# Patient Record
Sex: Male | Born: 1940
Health system: Southern US, Community
[De-identification: ages and names within clinical notes are randomized; demographics above are authoritative.]

## PROBLEM LIST (undated history)

## (undated) DIAGNOSIS — B1910 Unspecified viral hepatitis B without hepatic coma: Secondary | ICD-10-CM

## (undated) DIAGNOSIS — Z9889 Other specified postprocedural states: Secondary | ICD-10-CM

## (undated) DIAGNOSIS — I10 Essential (primary) hypertension: Secondary | ICD-10-CM

## (undated) DIAGNOSIS — T148XXA Other injury of unspecified body region, initial encounter: Secondary | ICD-10-CM

## (undated) DIAGNOSIS — E785 Hyperlipidemia, unspecified: Secondary | ICD-10-CM

## (undated) DIAGNOSIS — N529 Male erectile dysfunction, unspecified: Secondary | ICD-10-CM

## (undated) DIAGNOSIS — Z1289 Encounter for screening for malignant neoplasm of other sites: Secondary | ICD-10-CM

## (undated) DIAGNOSIS — N400 Enlarged prostate without lower urinary tract symptoms: Secondary | ICD-10-CM

## (undated) HISTORY — DX: Encounter for screening for malignant neoplasm of other sites: Z12.89

## (undated) HISTORY — DX: Essential (primary) hypertension: I10

## (undated) HISTORY — DX: Unspecified viral hepatitis B without hepatic coma: B19.10

## (undated) HISTORY — DX: Benign prostatic hyperplasia without lower urinary tract symptoms: N40.0

## (undated) HISTORY — DX: Male erectile dysfunction, unspecified: N52.9

## (undated) HISTORY — PX: APPENDECTOMY: SHX54

## (undated) HISTORY — DX: Other injury of unspecified body region, initial encounter: T14.8XXA

## (undated) HISTORY — DX: Other specified postprocedural states: Z98.890

## (undated) HISTORY — DX: Hyperlipidemia, unspecified: E78.5

## (undated) HISTORY — PX: TIBIA FRACTURE SURGERY: SHX806

## (undated) HISTORY — PX: TONSILLECTOMY: SUR1361

---

## 1997-02-05 DIAGNOSIS — Z9889 Other specified postprocedural states: Secondary | ICD-10-CM

## 1997-02-05 HISTORY — DX: Other specified postprocedural states: Z98.890

## 1997-06-29 ENCOUNTER — Emergency Department (HOSPITAL_COMMUNITY): Admission: EM | Admit: 1997-06-29 | Discharge: 1997-06-29 | Payer: Self-pay | Admitting: Emergency Medicine

## 1997-07-06 ENCOUNTER — Ambulatory Visit (HOSPITAL_COMMUNITY): Admission: RE | Admit: 1997-07-06 | Discharge: 1997-07-06 | Payer: Self-pay | Admitting: Family Medicine

## 1997-11-29 ENCOUNTER — Ambulatory Visit (HOSPITAL_COMMUNITY): Admission: RE | Admit: 1997-11-29 | Discharge: 1997-11-29 | Payer: Self-pay | Admitting: Interventional Cardiology

## 1997-11-29 ENCOUNTER — Encounter: Payer: Self-pay | Admitting: Interventional Cardiology

## 1997-12-09 ENCOUNTER — Ambulatory Visit (HOSPITAL_COMMUNITY): Admission: RE | Admit: 1997-12-09 | Discharge: 1997-12-09 | Payer: Self-pay | Admitting: Interventional Cardiology

## 1998-01-20 ENCOUNTER — Ambulatory Visit (HOSPITAL_COMMUNITY): Admission: RE | Admit: 1998-01-20 | Discharge: 1998-01-20 | Payer: Self-pay | Admitting: Internal Medicine

## 2000-02-21 ENCOUNTER — Encounter: Payer: Self-pay | Admitting: Emergency Medicine

## 2000-02-21 ENCOUNTER — Emergency Department (HOSPITAL_COMMUNITY): Admission: EM | Admit: 2000-02-21 | Discharge: 2000-02-21 | Payer: Self-pay | Admitting: Emergency Medicine

## 2003-09-07 ENCOUNTER — Encounter: Admission: RE | Admit: 2003-09-07 | Discharge: 2003-10-19 | Payer: Self-pay | Admitting: Emergency Medicine

## 2004-10-27 ENCOUNTER — Encounter: Admission: RE | Admit: 2004-10-27 | Discharge: 2005-01-25 | Payer: Self-pay | Admitting: Family Medicine

## 2004-11-28 LAB — HM COLONOSCOPY

## 2006-04-19 ENCOUNTER — Emergency Department (HOSPITAL_COMMUNITY): Admission: EM | Admit: 2006-04-19 | Discharge: 2006-04-19 | Payer: Self-pay | Admitting: Pediatrics

## 2007-07-22 ENCOUNTER — Encounter: Payer: Self-pay | Admitting: Family Medicine

## 2007-07-22 LAB — CONVERTED CEMR LAB
ALT: 30 units/L
AST: 23 units/L
BUN: 24 mg/dL
Glucose, Bld: 117 mg/dL
Total Protein: 6.3 g/dL

## 2007-10-01 ENCOUNTER — Encounter: Payer: Self-pay | Admitting: Family Medicine

## 2007-10-01 LAB — CONVERTED CEMR LAB
AST: 25 units/L
BUN: 24 mg/dL
Creatinine, Ser: 1.1 mg/dL
HDL: 58 mg/dL
Hgb A1c MFr Bld: 6.4 %
Potassium: 4 meq/L
Sodium: 140 meq/L
Total Protein: 6.3 g/dL
Triglycerides: 79 mg/dL

## 2008-02-06 LAB — FECAL OCCULT BLOOD, GUAIAC: Fecal Occult Blood: NEGATIVE

## 2008-04-16 ENCOUNTER — Encounter: Payer: Self-pay | Admitting: Family Medicine

## 2008-04-16 LAB — CONVERTED CEMR LAB
ALT: 19 units/L
HDL: 57 mg/dL
Hgb A1c MFr Bld: 5.3 %
Total Protein: 5.9 g/dL
Triglycerides: 63 mg/dL

## 2008-08-18 ENCOUNTER — Encounter: Payer: Self-pay | Admitting: Family Medicine

## 2008-08-18 LAB — CONVERTED CEMR LAB
Creatinine, Ser: 1.06 mg/dL
Hgb A1c MFr Bld: 6.3 %
Hgb A1c MFr Bld: 6.3 %
Microalb, Ur: 3.8 mg/dL
Microalb, Ur: 3.8 mg/dL
PSA: 5.1 ng/mL
PSA: 5.1 ng/mL
Potassium: 4 meq/L

## 2009-02-05 DIAGNOSIS — T148XXA Other injury of unspecified body region, initial encounter: Secondary | ICD-10-CM

## 2009-02-05 HISTORY — DX: Other injury of unspecified body region, initial encounter: T14.8XXA

## 2009-08-08 ENCOUNTER — Ambulatory Visit: Payer: Self-pay | Admitting: Diagnostic Radiology

## 2009-08-08 ENCOUNTER — Emergency Department (HOSPITAL_BASED_OUTPATIENT_CLINIC_OR_DEPARTMENT_OTHER): Admission: EM | Admit: 2009-08-08 | Discharge: 2009-08-08 | Payer: Self-pay | Admitting: Emergency Medicine

## 2009-09-14 ENCOUNTER — Encounter: Payer: Self-pay | Admitting: Family Medicine

## 2009-09-14 ENCOUNTER — Ambulatory Visit: Payer: Self-pay | Admitting: Family Medicine

## 2009-09-14 DIAGNOSIS — E785 Hyperlipidemia, unspecified: Secondary | ICD-10-CM | POA: Insufficient documentation

## 2009-09-14 DIAGNOSIS — E119 Type 2 diabetes mellitus without complications: Secondary | ICD-10-CM | POA: Insufficient documentation

## 2009-09-14 DIAGNOSIS — N529 Male erectile dysfunction, unspecified: Secondary | ICD-10-CM | POA: Insufficient documentation

## 2009-09-14 DIAGNOSIS — I10 Essential (primary) hypertension: Secondary | ICD-10-CM | POA: Insufficient documentation

## 2009-09-14 DIAGNOSIS — N400 Enlarged prostate without lower urinary tract symptoms: Secondary | ICD-10-CM | POA: Insufficient documentation

## 2009-09-14 LAB — HM DIABETES FOOT EXAM

## 2009-09-14 LAB — CONVERTED CEMR LAB

## 2009-09-16 ENCOUNTER — Encounter (INDEPENDENT_AMBULATORY_CARE_PROVIDER_SITE_OTHER): Payer: Self-pay | Admitting: *Deleted

## 2009-09-16 LAB — CONVERTED CEMR LAB
AST: 24 units/L (ref 0–37)
Alkaline Phosphatase: 63 units/L (ref 39–117)
Bilirubin, Direct: 0.2 mg/dL (ref 0.0–0.3)
GFR calc non Af Amer: 84.94 mL/min (ref >60)
Glucose, Bld: 98 mg/dL (ref 70–99)
HDL: 55.2 mg/dL (ref >39.00)
Hgb A1c MFr Bld: 5.7 % (ref 4.6–6.5)
LDL Cholesterol: 71 mg/dL (ref 0–99)
PSA: 3.79 ng/mL (ref 0.10–4.00)
Potassium: 4.1 meq/L (ref 3.5–5.1)
Sodium: 143 meq/L (ref 135–145)
Total Bilirubin: 0.9 mg/dL (ref 0.3–1.2)
Total CHOL/HDL Ratio: 3
VLDL: 12.2 mg/dL (ref 0.0–40.0)

## 2009-09-20 ENCOUNTER — Encounter: Payer: Self-pay | Admitting: Family Medicine

## 2009-09-26 ENCOUNTER — Telehealth: Payer: Self-pay | Admitting: Family Medicine

## 2009-10-05 ENCOUNTER — Encounter: Payer: Self-pay | Admitting: Family Medicine

## 2010-02-21 ENCOUNTER — Other Ambulatory Visit: Payer: Self-pay | Admitting: Family Medicine

## 2010-02-21 ENCOUNTER — Ambulatory Visit
Admission: RE | Admit: 2010-02-21 | Discharge: 2010-02-21 | Payer: Self-pay | Source: Home / Self Care | Attending: Family Medicine | Admitting: Family Medicine

## 2010-02-21 LAB — GLUCOSE, RANDOM: Glucose, Bld: 113 mg/dL — ABNORMAL HIGH (ref 70–99)

## 2010-02-21 LAB — HEMOGLOBIN A1C: Hgb A1c MFr Bld: 5.9 % (ref 4.6–6.5)

## 2010-03-07 NOTE — Miscellaneous (Signed)
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  (10/05/2009 17:47) Added new observation of PAST MED HX: R proximal humerus fx 2011 1999 Cardiac Catheterization and Tilt Table Test, both negative DEPRESSION (ICD-311) with night terrors, treated with Zoloft BENIGN PROSTATIC HYPERTROPHY (ICD-600.00) HYPERTENSION (ICD-401.9) HYPERLIPIDEMIA (ICD-272.4) SPECIAL SCREENING MALIG NEOPLASMS OTHER SITES (ICD-V76.49) HEALTH MAINTENANCE EXAM (ICD-V70.0) ORGANIC IMPOTENCE (ICD-607.84) BENIGN PROSTATIC HYPERTROPHY, MILD, HX OF (ICD-V13.8) DIABETES MELLITUS, TYPE II, CONTROLLED (ICD-250.00)   (10/05/2009 17:47) Added new observation of MICROALB TST: 3.8 mg/dL (54/10/8117 14:78) Added new observation of HGBA1C: 6.3 % (08/18/2008 17:47) Added new observation of PSA: 5.1 ng/mL (08/18/2008 17:47) Added new observation of CREATININE: 1.06 mg/dL (29/56/2130 86:57) Added new observation of BUN: 22 mg/dL (84/69/6295 28:41) Added new observation of BG RANDOM: 94 mg/dL (32/44/0102 72:53) Added new observation of K SERUM: 4.0 meq/L (08/18/2008 17:47) Added new observation of NA: 138 meq/L (08/18/2008 17:47) Added new observation of HGBA1C: 5.3 % (04/16/2008 17:47) Added new observation of PROTEIN, TOT: 5.9 g/dL (66/44/0347 42:59) Added new observation of SGPT (ALT): 19 units/L (04/16/2008 17:47) Added new observation of SGOT (AST): 21 units/L (04/16/2008 17:47) Added new observation of CREATININE: 1.20 mg/dL (56/38/7564 33:29) Added new observation of BUN: 21 mg/dL (51/88/4166 06:30) Added new observation of BG RANDOM: 107 mg/dL (16/02/930 35:57) Added new observation of LDL DIR: 69 mg/dL (32/20/2542 70:62) Added new  observation of HDL: 57 mg/dL (37/62/8315 17:61) Added new observation of TRIGLYC TOT: 63 mg/dL (60/73/7106 26:94) Added new observation of CHOLESTEROL: 138 mg/dL (85/46/2703 50:09) Added new observation of HGBA1C: 6.4 % (10/01/2007 17:47) Added new observation of PROTEIN, TOT: 6.3 g/dL (38/18/2993 71:69) Added new observation of SGPT (ALT): 31 units/L (10/01/2007 17:47) Added new observation of SGOT (AST): 25 units/L (10/01/2007 17:47) Added new observation of CREATININE: 1.10 mg/dL (67/89/3810 17:51) Added new observation of BUN: 24 mg/dL (02/58/5277 82:42) Added new observation of BG RANDOM: 116 mg/dL (35/36/1443 15:40) Added new observation of K SERUM: 4.0 meq/L (10/01/2007 17:47) Added new observation of NA: 140 meq/L (10/01/2007 17:47) Added new observation of LDL DIR: 106 mg/dL (08/67/6195 09:32) Added new observation of CHOL/HDL: 2.86  (10/01/2007 17:47) Added new observation of HDL: 58 mg/dL (67/01/4579 99:83) Added new observation of TRIGLYC TOT: 79 mg/dL (38/25/0539 76:73) Added new observation of CHOLESTEROL: 166 mg/dL (41/93/7902 40:97) Added new observation of HGBA1C: 6.2 % (07/22/2007 17:47) Added new observation of PROTEIN, TOT: 6.3 g/dL (35/32/9924 26:83) Added new observation of SGPT (ALT): 30 units/L (07/22/2007 17:47) Added new observation of SGOT (AST): 23 units/L (07/22/2007 17:47) Added new observation of CREATININE: 1.00 mg/dL (41/96/2229 79:89) Added new observation of BUN: 24 mg/dL (21/19/4174 08:14) Added new observation of BG RANDOM: 117 mg/dL (48/18/5631 49:70) Added new observation of K SERUM: 4.1 meq/L (07/22/2007 17:47) Added new observation of NA: 138 meq/L (07/22/2007 17:47)      Past History:  Past Medical History: R proximal humerus fx 2011 1999 Cardiac Catheterization and Tilt Table Test, both negative DEPRESSION (ICD-311) with night terrors, treated with Zoloft BENIGN PROSTATIC HYPERTROPHY (ICD-600.00) HYPERTENSION (ICD-401.9) HYPERLIPIDEMIA  (ICD-272.4) SPECIAL SCREENING MALIG NEOPLASMS OTHER SITES (ICD-V76.49) HEALTH MAINTENANCE EXAM (ICD-V70.0) ORGANIC IMPOTENCE (ICD-607.84) BENIGN PROSTATIC HYPERTROPHY, MILD, HX OF (ICD-V13.8) DIABETES MELLITUS, TYPE II, CONTROLLED (ICD-250.00)     Social History: No  tob, smoked from 1966 to 1986 No alcohol 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

## 2010-03-07 NOTE — Assessment & Plan Note (Signed)
Summary: CPX FOR FAA PER dUNCAN/DLO   Vital Signs:  Patient profile:   70 year old male Height:      70 inches Weight:      203.75 pounds BMI:     29.34 Temp:     97.5 degrees F oral Pulse rate:   84 / minute Pulse rhythm:   regular BP sitting:   128 / 80  (left arm) Cuff size:   large  Vitals Entered By: Delilah Shan CMA Duncan Dull) (September 14, 2009 2:05 PM)  CC: CPX for FAA per Dr. Para March, Preventive Care   History of Present Illness: CPE- See prev med.  Elevated Cholesterol: Using medications without problems: yes Muscle aches: no Other complaints: no  Diabetes:  Using medications without difficulties:yes Hypoglycemic episodes:no Hyperglycemic episodes:no Feet problems:no Blood Sugars averaging:  ~120 usually, ranges 90-130 eye exam within last year: follow up is pending  Hypertension:      Using medication without problems or lightheadedness: yes Chest pain with exertion:no Edema:no Short of breath:no Average home UYQ:[id number]  ~120/70 Other issues: no  ED noted recently by patient.  Unsure if he would like to try meds at this point.   Allergies (verified): 1)  ! * Alcoholic Beverages  Past History:  Past Medical History: Diabetes mellitus, type II Hyperlipidemia Hypertension Benign prostatic hypertrophy R proximal humerus fx 2011  Past Surgical History: L Leg fx and repair, resolved w/o deficit Appendectomy Tonsillectomy  Family History: Reviewed history and no changes required. F alive, h/o colon CA-treated. also with seizure d/o after head injury M dead colon CA brother alive, knee replacement  Social History: Reviewed history and no changes required. 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  Otherwise negative.  feeling well .  Physical Exam  General:  GEN: nad, alert and oriented HEENT:  mucous membranes moist NECK: supple w/o LA CV: rrr.  no murmur PULM: ctab, no inc wob ABD: soft, +bs EXT: no edema SKIN: no acute rash  R shoulder abducts to 90deg in isolation/AROM, up to 135deg PROM.  Distally NV intact.  Rectal:  No external abnormalities noted. Normal sphincter tone. No rectal masses or tenderness. Prostate:  Prostate gland firm and smooth, minimal enlargement, no nodularity, tenderness, mass, asymmetry or induration.  Diabetes Management Exam:    Foot Exam (with socks and/or shoes not present):       Sensory-Pinprick/Light touch:          Left medial foot (L-4): normal          Left dorsal foot (L-5): normal          Left lateral foot (S-1): normal          Right medial foot (L-4): normal          Right dorsal foot (L-5): normal          Right lateral foot (S-1): normal       Sensory-Monofilament:          Left foot: normal          Right foot: normal       Inspection:          Left foot: normal          Right foot: normal       Nails:  Left foot: normal          Right foot: normal   Impression & Recommendations:  Problem # 1:  Preventive Health Care (ICD-V70.0) D/w patient re:flu and shingles shot.  See prev med and orders/referral.   Problem # 2:  BENIGN PROSTATIC HYPERTROPHY, MILD, HX OF (ICD-V13.8) D/w patient re:psa. contatc wiht labs.  Orders: TLB-PSA (Prostate Specific Antigen) (84153-PSA)  Problem # 3:  HYPERCHOLESTEROLEMIA (ICD-272.0) contact with labs.  His updated medication list for this problem includes:    Zocor 40 Mg Tabs (Simvastatin) .Marland Kitchen... Take 1 tab by mouth at bedtime  Problem # 4:  HYPERTENSION, BENIGN (ICD-401.1) contact with labs.  His updated medication list for this problem includes:    Cozaar 50 Mg Tabs (Losartan potassium) .Marland Kitchen... Take 1 tablet by mouth every morning    Doxazosin Mesylate 8 Mg Tabs (Doxazosin mesylate) .Marland Kitchen... Take one table by mouth each evening  Problem # 5:  DIABETES MELLITUS, TYPE II,  CONTROLLED (ICD-250.00) contact with labs.  His updated medication list for this problem includes:    Actos 30 Mg Tabs (Pioglitazone hcl) .Marland Kitchen... Take 1 tablet by mouth every morning    Cozaar 50 Mg Tabs (Losartan potassium) .Marland Kitchen... Take 1 tablet by mouth every morning    Metformin Hcl 500 Mg Tabs (Metformin hcl) .Marland Kitchen... Take 1 tablet by mouth two times a day    Aspirin 81 Mg Tabs (Aspirin) .Marland Kitchen... Take 1 tab by mouth at bedtime  Orders: TLB-BMP (Basic Metabolic Panel-BMET) (80048-METABOL) TLB-Hepatic/Liver Function Pnl (80076-HEPATIC) TLB-Lipid Panel (80061-LIPID) TLB-A1C / Hgb A1C (Glycohemoglobin) (83036-A1C) TLB-Microalbumin/Creat Ratio, Urine (82043-MALB)  Problem # 6:  ORGANIC IMPOTENCE (ICD-607.84) Defer to patient on starting treatment.  No new meds for this today.   Complete Medication List: 1)  Actos 30 Mg Tabs (Pioglitazone hcl) .... Take 1 tablet by mouth every morning 2)  Cozaar 50 Mg Tabs (Losartan potassium) .... Take 1 tablet by mouth every morning 3)  Metformin Hcl 500 Mg Tabs (Metformin hcl) .... Take 1 tablet by mouth two times a day 4)  Doxazosin Mesylate 8 Mg Tabs (Doxazosin mesylate) .... Take one table by mouth each evening 5)  Zocor 40 Mg Tabs (Simvastatin) .... Take 1 tab by mouth at bedtime 6)  Aspirin 81 Mg Tabs (Aspirin) .... Take 1 tab by mouth at bedtime  Other Orders: EKG w/ Interpretation (93000) Gastroenterology Referral (GI)  Colorectal Screening:  Current Recommendations:    Hemoccult: NEG X 1 today    Colonoscopy recommended: scheduled with G.I.  PSA Screening:    Reviewed PSA screening recommendations: PSA ordered  Patient Instructions: 1)  We'll contact you with your lab report.  See Shirlee Limerick about your referral before your leave today.  I'll send a copy of your note.  Take care.  Prescriptions: ZOCOR 40 MG TABS (SIMVASTATIN) Take 1 tab by mouth at bedtime  #90 x 3   Entered and Authorized by:   Crawford Givens MD   Signed by:   Crawford Givens MD  on 09/14/2009   Method used:   Print then Give to Patient   RxID:   [id number] DOXAZOSIN MESYLATE 8 MG TABS (DOXAZOSIN MESYLATE) Take one table by mouth each evening  #90 x 3   Entered and Authorized by:   Crawford Givens MD   Signed by:   Crawford Givens MD on 09/14/2009   Method used:   Print then Give to Patient   RxID:   [id number] METFORMIN HCL 500 MG TABS (METFORMIN HCL) Take 1  tablet by mouth two times a day  #180 x 3   Entered and Authorized by:   Crawford Givens MD   Signed by:   Crawford Givens MD on 09/14/2009   Method used:   Print then Give to Patient   RxID:   [id number] COZAAR 50 MG TABS (LOSARTAN POTASSIUM) Take 1 tablet by mouth every morning  #90 x 3   Entered and Authorized by:   Crawford Givens MD   Signed by:   Crawford Givens MD on 09/14/2009   Method used:   Print then Give to Patient   RxID:   [id number] ACTOS 30 MG TABS (PIOGLITAZONE HCL) Take 1 tablet by mouth every morning  #90 x 3   Entered and Authorized by:   Crawford Givens MD   Signed by:   Crawford Givens MD on 09/14/2009   Method used:   Print then Give to Patient   RxID:   [id number]   Prior Medications: ACTOS 30 MG TABS (PIOGLITAZONE HCL) Take 1 tablet by mouth every morning COZAAR 50 MG TABS (LOSARTAN POTASSIUM) Take 1 tablet by mouth every morning METFORMIN HCL 500 MG TABS (METFORMIN HCL) Take 1 tablet by mouth two times a day DOXAZOSIN MESYLATE 8 MG TABS (DOXAZOSIN MESYLATE) Take one table by mouth each evening ZOCOR 40 MG TABS (SIMVASTATIN) Take 1 tab by mouth at bedtime ASPIRIN 81 MG  TABS (ASPIRIN) Take 1 tab by mouth at bedtime Current Allergies (reviewed today): ! * ALCOHOLIC BEVERAGES        Prevention & Chronic Care Immunizations   Influenza vaccine: Not documented   Influenza vaccine due: 10/06/2009    Tetanus booster: Not documented    Pneumococcal vaccine: Not documented   Pneumococcal vaccine deferral: Not indicated  (09/14/2009)    H. zoster vaccine:  Not documented  Colorectal Screening   Hemoccult: Not documented   Hemoccult action/deferral: NEG X 1 today  (09/14/2009)    Colonoscopy: Not documented   Colonoscopy action/deferral: scheduled with G.I.  (09/14/2009)  Other Screening   PSA: Not documented   PSA ordered.   PSA action/deferral: PSA ordered  (09/14/2009)   Smoking status: Not documented  Diabetes Mellitus   HgbA1C: Not documented    Eye exam: Not documented    Foot exam: yes  (09/14/2009)   Foot exam action/deferral: Do today   High risk foot: Not documented   Foot care education: Not documented    Urine microalbumin/creatinine ratio: Not documented    Diabetes flowsheet reviewed?: Yes  Lipids   Total Cholesterol: Not documented   LDL: Not documented   LDL Direct: Not documented   HDL: Not documented   Triglycerides: Not documented    SGOT (AST): Not documented   SGPT (ALT): Not documented   Alkaline phosphatase: Not documented   Total bilirubin: Not documented    Lipid flowsheet reviewed?: Yes  Hypertension   Last Blood Pressure: 128 / 80  (09/14/2009)   Serum creatinine: Not documented   Serum potassium Not documented    Hypertension flowsheet reviewed?: Yes  Self-Management Support :    Diabetes self-management support: Not documented    Hypertension self-management support: Not documented    Lipid self-management support: Not documented    Nursing Instructions: Diabetic foot exam today       EKG  Procedure date:  09/14/2009  Findings:      Normal sinus rhythm with rate of:  Sinus bradycardia with rate of:    EKG  Procedure date:  09/14/2009  Findings:  Sinus bradycardia with rate of:  50, o/w wnl

## 2010-03-07 NOTE — Progress Notes (Signed)
Summary: rxs  Phone Note Refill Request Call back at [phone] Message from:  Patient on September 26, 2009 8:48 AM  Refills Requested: Medication #1:  ACTOS 30 MG TABS Take 1 tablet by mouth every morning  Medication #2:  COZAAR 50 MG TABS Take 1 tablet by mouth every morning  Medication #3:  METFORMIN HCL 500 MG TABS Take 1 tablet by mouth two times a day  Medication #4:  ZOCOR 40 MG TABS Take 1 tab by mouth at bedtime Patient also needs Doxazosin Mesylate 8 mg tabs. He says that it will be two or three weeks before he will receive these through his mail order pharmacy and wants to know if he could get a two or three week supply to last him until then. Uses [facility].   Initial call taken by: Melody Comas,  September 26, 2009 8:48 AM  Follow-up for Phone Call        done.  Follow-up by: Crawford Givens MD,  September 26, 2009 8:55 AM    Prescriptions: DOXAZOSIN MESYLATE 8 MG TABS (DOXAZOSIN MESYLATE) Take one table by mouth each evening  #30 x 3   Entered and Authorized by:   Crawford Givens MD   Signed by:   Crawford Givens MD on 09/26/2009   Method used:   Electronically to        [facility]* (retail)       [address]       [place], [place]  [id number]       Ph: [phone]       Fax: [phone]   RxID:   [id number]

## 2010-03-07 NOTE — Letter (Signed)
Summary: Generic Letter   at [facility]  [address] [place], [place] [address]   Phone: [phone]  Fax: [phone]    09/16/2009    Nadim Caras [address] [place], [place]  [address]       Mr. Gains:  Your labs are all normal or unremarkable.  A copy of your examination note and your labwork is enclosed.  Thank you.  Lugene Fuquay CMA (AAMA)  September 16, 2009 9:18 AM

## 2010-03-07 NOTE — Miscellaneous (Signed)
  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  (09/20/2009 16:28) Added new observation of REGULAREXERC: no (09/20/2009 16:28) Added new observation of COLONOSCOPY: Due 2011 - Buccini (02/05/2009 16:34) Added new observation of MICROABL/CRE: 136.3 mg/mg (creat) (08/18/2008 16:28) Added new observation of MICROALB TST: 3.8 mg/dL (16/11/9602 54:09) Added new observation of HGBA1C: 6.3 % (08/18/2008 16:28) Added new observation of PSA: 5.1 ng/mL (08/18/2008 16:28) Added new observation of CREATININE: 1.06 mg/dL (81/19/1478 29:56) Added new observation of BG RANDOM: 94 mg/dL (21/30/8657 84:69) Added new observation of HEMOCCULT: negative  (02/06/2008 16:34) Added new observation of PNEUMOVAX: given  (02/06/2004 16:34) Added new observation of TD BOOSTER: Td  (02/06/2004 16:34)      Preventive Care Screening  Colonoscopy:    Date:  02/05/2009    Results:  Due 2011 - Buccini  PSA:    Date:  08/18/2008    Results:  5.1 ng/mL  Hemoccult:    Date:  02/06/2008    Results:  negative   Last Pneumovax:    Date:  02/06/2004    Results:  given   Last Tetanus Booster:    Date:  02/06/2004    Results:  Td      Social History: No tob No alcohol No drug use 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

## 2010-03-09 NOTE — Assessment & Plan Note (Signed)
Summary: F/U,CHECK SUGAR/CLE   Vital Signs:  Patient profile:   70 year old male Height:      70 inches Weight:      207.75 pounds BMI:     29.92 Temp:     97.8 degrees F oral Pulse rate:   76 / minute Pulse rhythm:   regular BP sitting:   122 / 70  (left arm) Cuff size:   large  Vitals Entered By: Delilah Shan CMA Duncan Dull) (February 21, 2010 8:06 AM) CC: F/U - check sugar   History of Present Illness: Occ cold feeling in his feet and some constipation.  New meds (refills) arrived in December and since then sugar has been up.  No change in diet to explain this.  He has been attentive to diet- using sugar free candy.  Unsure if this was due to meds, strips, or other cause.  On DM2 diet.  Still using the old strips/meter.    The copay changed on the actos changed but the pills look the same.  His sugars have been  ~200 in AM and this is unusual for the patient.   Allergies: 1)  ! 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  Otherwise negative.    Physical Exam  General:  GEN: nad, alert and oriented HEENT: mucous membranes moist NECK: supple w/o LA CV: rrr.  no murmur PULM: ctab, no inc wob ABD: soft, +bs EXT: no edema SKIN: no acute rash  R shoulder  range of motion improved from prev. Normal ext rotation, mild decrease in internal rotation.    Impression & Recommendations:  Problem # 1:  DIABETES MELLITUS, TYPE II (ICD-250.00) See notes on labs.  No change in meds yet. Rx written for lancets and strips.  3 month supply of both with 3rf.   His updated medication list for this problem includes:    Actos 30 Mg Tabs (Pioglitazone hcl) .Marland Kitchen... Take 1 tablet by mouth every morning    Cozaar 50 Mg Tabs (Losartan potassium) .Marland Kitchen... Take 1 tablet by mouth every morning    Metformin Hcl 500 Mg Tabs (Metformin hcl) .Marland Kitchen... Take 1 tablet by mouth two times a day    Aspirin 81 Mg Tabs (Aspirin) .Marland Kitchen... Take 1 tab by mouth at bedtime  Orders: Specimen Handling ([address]) Venipuncture ([address]) TLB-Glucose, QUANT (82947-GLU) TLB-A1C / Hgb A1C (Glycohemoglobin) (83036-A1C)  Complete Medication List: 1)  Actos 30 Mg Tabs (Pioglitazone hcl) .... Take 1 tablet by mouth every morning 2)  Cozaar 50 Mg Tabs (Losartan potassium) .... Take 1 tablet by mouth every morning 3)  Metformin Hcl 500 Mg Tabs (Metformin hcl) .... Take 1 tablet by mouth two times a day 4)  Doxazosin Mesylate 8 Mg Tabs (Doxazosin mesylate) .... Take one table by mouth each evening 5)  Zocor 40 Mg Tabs (Simvastatin) .... Take 1 tab by mouth  at bedtime 6)  Aspirin 81 Mg Tabs (Aspirin) .... Take 1 tab by mouth at bedtime  Patient Instructions: 1)  We'll contact you with your lab report. If your sugar is high, we may need to get you a 30d supply of your med locally and see if the readings are different.  Take care.    Orders Added: 1)  Est. Patient Level III [30865] 2)  Specimen Handling [99000] 3)  Venipuncture [36415] 4)  TLB-Glucose, QUANT [82947-GLU] 5)  TLB-A1C / Hgb A1C (Glycohemoglobin) [83036-A1C]    Current Allergies (reviewed today): ! * ALCOHOLIC BEVERAGES

## 2010-05-29 ENCOUNTER — Ambulatory Visit (INDEPENDENT_AMBULATORY_CARE_PROVIDER_SITE_OTHER): Payer: BC Managed Care – PPO | Admitting: Family Medicine

## 2010-05-29 ENCOUNTER — Encounter: Payer: Self-pay | Admitting: Family Medicine

## 2010-05-29 VITALS — BP 124/76 | HR 76 | Temp 97.4°F | Wt 217.0 lb

## 2010-05-29 DIAGNOSIS — W57XXXA Bitten or stung by nonvenomous insect and other nonvenomous arthropods, initial encounter: Secondary | ICD-10-CM | POA: Insufficient documentation

## 2010-05-29 DIAGNOSIS — T148 Other injury of unspecified body region: Secondary | ICD-10-CM

## 2010-05-29 MED ORDER — DOXYCYCLINE HYCLATE 100 MG PO TABS: 100.0000 mg | ORAL_TABLET | Freq: Two times a day (BID) | ORAL | Status: AC

## 2010-05-29 NOTE — Assessment & Plan Note (Signed)
Start doxy given the erythema.  Border marked and pt to fu prn.  Okay for outpatient fu and w/o systemic sx currently.

## 2010-05-29 NOTE — Patient Instructions (Signed)
Start the doxycycline today and be careful about sunburns.  Let me know if the area gets bigger, more painful, or if you have other concerns.

## 2010-05-29 NOTE — Progress Notes (Signed)
Tick bite 4/20 on R posterior knee.  Tick was attached but was removed by patient.  Area is red since Saturday AM, itching.  No FCNAVD.   Some pain with walking but not ttp.  Came in because of the local erythema.  Meds, vitals, and allergies reviewed.   ROS: See HPI.  Otherwise, noncontributory.  nad No rash noted except for 5x7.5cm area on R knee, posterior, with blanching macular erythema with central epithelial disruption but no purulent discharge or fluctuance.

## 2010-07-07 ENCOUNTER — Other Ambulatory Visit: Payer: Self-pay | Admitting: *Deleted

## 2010-07-07 MED ORDER — PIOGLITAZONE HCL 30 MG PO TABS: 30.0000 mg | ORAL_TABLET | Freq: Every day | ORAL | Status: DC

## 2010-07-07 MED ORDER — DOXAZOSIN MESYLATE 8 MG PO TABS: 8.0000 mg | ORAL_TABLET | Freq: Every day | ORAL | Status: DC

## 2010-07-07 MED ORDER — LOSARTAN POTASSIUM 50 MG PO TABS: 50.0000 mg | ORAL_TABLET | Freq: Every day | ORAL | Status: DC

## 2010-07-07 MED ORDER — METFORMIN HCL 500 MG PO TABS: 500.0000 mg | ORAL_TABLET | Freq: Two times a day (BID) | ORAL | Status: DC

## 2010-07-07 MED ORDER — SIMVASTATIN 40 MG PO TABS: 40.0000 mg | ORAL_TABLET | Freq: Every day | ORAL | Status: DC

## 2010-07-07 NOTE — Telephone Encounter (Signed)
Received fax from pharmacy stating that patient would like to start getting his Rx's from the local pharmacy.  Please advise.

## 2010-07-07 NOTE — Telephone Encounter (Signed)
D/w pt and meds sent.

## 2010-08-18 ENCOUNTER — Encounter: Payer: Self-pay | Admitting: Family Medicine

## 2010-08-18 ENCOUNTER — Ambulatory Visit (INDEPENDENT_AMBULATORY_CARE_PROVIDER_SITE_OTHER): Payer: BC Managed Care – PPO | Admitting: Family Medicine

## 2010-08-18 VITALS — BP 118/74 | HR 64 | Temp 98.2°F | Ht 71.0 in | Wt 215.0 lb

## 2010-08-18 DIAGNOSIS — I10 Essential (primary) hypertension: Secondary | ICD-10-CM

## 2010-08-18 DIAGNOSIS — E119 Type 2 diabetes mellitus without complications: Secondary | ICD-10-CM

## 2010-08-18 DIAGNOSIS — Z0289 Encounter for other administrative examinations: Secondary | ICD-10-CM

## 2010-08-18 DIAGNOSIS — Z299 Encounter for prophylactic measures, unspecified: Secondary | ICD-10-CM

## 2010-08-18 LAB — COMPREHENSIVE METABOLIC PANEL
ALT: 22 U/L (ref 0–53)
AST: 18 U/L (ref 0–37)
Albumin: 4.8 g/dL (ref 3.5–5.2)
BUN: 28 mg/dL — ABNORMAL HIGH (ref 6–23)
Calcium: 9.7 mg/dL (ref 8.4–10.5)
Chloride: 109 mEq/L (ref 96–112)
Potassium: 4.3 mEq/L (ref 3.5–5.1)

## 2010-08-18 LAB — LIPID PANEL
Cholesterol: 154 mg/dL (ref 0–200)
LDL Cholesterol: 83 mg/dL (ref 0–99)

## 2010-08-18 NOTE — Patient Instructions (Signed)
Don't change your meds.  You can get your results through our phone system.  Follow the instructions on the blue card.  I'll send in your reports.   Check with your insurance on the pneumonia and shingles shot.   Glad to see you.  Come back in 6 months for labs and a OV.   Take care.

## 2010-08-20 ENCOUNTER — Encounter: Payer: Self-pay | Admitting: Family Medicine

## 2010-08-20 DIAGNOSIS — Z0000 Encounter for general adult medical examination without abnormal findings: Secondary | ICD-10-CM | POA: Insufficient documentation

## 2010-08-20 NOTE — Progress Notes (Addendum)
Diabetes:  Using medications without difficulties:yes Hypoglycemic episodes:none Hyperglycemic episodes:none Feet problems:no Blood Sugars averaging: 100-120 fasting eye exam within last year:yes No evidence of cardiovascular, neurological, renal, or eye disease.  Eye exam per Dr. Nelle Don- no retinopathy.   EKG reviewed, sinus brady at 55 but o/w wnl.   Hypertension:    Using medication without problems or lightheadedness: yes Chest pain with exertion:no Edema:no Short of breath:no Other issues:no BP has been controlled on checks here in the clinic.    Current labs are pending  PMH and SH reviewed  Meds, vitals, and allergies reviewed.   ROS: See HPI.  Otherwise negative.    GEN: nad, alert and oriented HEENT: mucous membranes moist NECK: supple w/o LA CV: rrr. PULM: ctab, no inc wob ABD: soft, +bs EXT: no edema SKIN: no acute rash  Diabetic foot exam: Normal inspection No skin breakdown No calluses  Normal DP pulses Normal sensation to light touch and monofilament Nails thickened but normal o/w   Current Outpatient Prescriptions on File Prior to Visit  Medication Sig Dispense Refill  . aspirin 81 MG tablet Take 81 mg by mouth daily.        Marland Kitchen doxazosin (CARDURA) 8 MG tablet Take 1 tablet (8 mg total) by mouth at bedtime.  90 tablet  0  . losartan (COZAAR) 50 MG tablet Take 1 tablet (50 mg total) by mouth daily.  90 tablet  0  . metFORMIN (GLUCOPHAGE) 500 MG tablet Take 1 tablet (500 mg total) by mouth 2 (two) times daily with a meal.  180 tablet  0  . pioglitazone (ACTOS) 30 MG tablet Take 1 tablet (30 mg total) by mouth daily.  90 tablet  0  . simvastatin (ZOCOR) 40 MG tablet Take 1 tablet (40 mg total) by mouth at bedtime.  90 tablet  0   No side effects from meds.

## 2010-08-20 NOTE — Assessment & Plan Note (Signed)
Controlled, no change in meds.  See recent labs, attached.

## 2010-08-20 NOTE — Assessment & Plan Note (Addendum)
Controlled, no change in meds.  See recent labs, attached.  D/w pt about diet/exercise, weight.  >25 min spent with face to face with patient, >50% counseling.  Will send info to [facility] per patient request.

## 2010-10-20 ENCOUNTER — Other Ambulatory Visit: Payer: Self-pay | Admitting: Family Medicine

## 2011-01-23 ENCOUNTER — Telehealth: Payer: Self-pay | Admitting: Internal Medicine

## 2011-01-23 DIAGNOSIS — I10 Essential (primary) hypertension: Secondary | ICD-10-CM

## 2011-01-23 DIAGNOSIS — E785 Hyperlipidemia, unspecified: Secondary | ICD-10-CM

## 2011-01-23 MED ORDER — METFORMIN HCL 500 MG PO TABS: 500.0000 mg | ORAL_TABLET | Freq: Two times a day (BID) | ORAL | Status: DC

## 2011-01-23 MED ORDER — DOXAZOSIN MESYLATE 8 MG PO TABS: 8.0000 mg | ORAL_TABLET | Freq: Every day | ORAL | Status: DC

## 2011-01-23 MED ORDER — SIMVASTATIN 40 MG PO TABS: 40.0000 mg | ORAL_TABLET | Freq: Every day | ORAL | Status: DC

## 2011-01-23 MED ORDER — LOSARTAN POTASSIUM 50 MG PO TABS: 50.0000 mg | ORAL_TABLET | Freq: Every day | ORAL | Status: DC

## 2011-01-23 MED ORDER — PIOGLITAZONE HCL 30 MG PO TABS: 30.0000 mg | ORAL_TABLET | Freq: Every day | ORAL | Status: DC

## 2011-01-23 NOTE — Telephone Encounter (Signed)
Rx sent to pharmacy   

## 2011-02-11 ENCOUNTER — Other Ambulatory Visit: Payer: Self-pay | Admitting: Family Medicine

## 2011-02-11 DIAGNOSIS — N400 Enlarged prostate without lower urinary tract symptoms: Secondary | ICD-10-CM

## 2011-02-11 DIAGNOSIS — E119 Type 2 diabetes mellitus without complications: Secondary | ICD-10-CM

## 2011-02-13 ENCOUNTER — Other Ambulatory Visit: Payer: BC Managed Care – PPO

## 2011-02-16 ENCOUNTER — Ambulatory Visit: Payer: BC Managed Care – PPO | Admitting: Family Medicine

## 2011-02-27 ENCOUNTER — Other Ambulatory Visit (INDEPENDENT_AMBULATORY_CARE_PROVIDER_SITE_OTHER): Payer: BC Managed Care – PPO

## 2011-02-27 DIAGNOSIS — E119 Type 2 diabetes mellitus without complications: Secondary | ICD-10-CM

## 2011-02-27 DIAGNOSIS — N400 Enlarged prostate without lower urinary tract symptoms: Secondary | ICD-10-CM

## 2011-02-27 LAB — LIPID PANEL
Cholesterol: 145 mg/dL (ref 0–200)
LDL Cholesterol: 75 mg/dL (ref 0–99)
Total CHOL/HDL Ratio: 3

## 2011-02-27 LAB — COMPREHENSIVE METABOLIC PANEL
ALT: 23 U/L (ref 0–53)
AST: 21 U/L (ref 0–37)
BUN: 25 mg/dL — ABNORMAL HIGH (ref 6–23)
Creatinine, Ser: 1.1 mg/dL (ref 0.4–1.5)
Total Bilirubin: 0.9 mg/dL (ref 0.3–1.2)

## 2011-02-27 LAB — PSA: PSA: 4.52 ng/mL — ABNORMAL HIGH (ref 0.10–4.00)

## 2011-03-05 ENCOUNTER — Ambulatory Visit (INDEPENDENT_AMBULATORY_CARE_PROVIDER_SITE_OTHER): Payer: BC Managed Care – PPO | Admitting: Family Medicine

## 2011-03-05 ENCOUNTER — Encounter: Payer: Self-pay | Admitting: Family Medicine

## 2011-03-05 VITALS — BP 122/70 | HR 71 | Temp 97.7°F | Wt 222.8 lb

## 2011-03-05 DIAGNOSIS — Z87898 Personal history of other specified conditions: Secondary | ICD-10-CM

## 2011-03-05 DIAGNOSIS — E119 Type 2 diabetes mellitus without complications: Secondary | ICD-10-CM

## 2011-03-05 DIAGNOSIS — E785 Hyperlipidemia, unspecified: Secondary | ICD-10-CM

## 2011-03-05 DIAGNOSIS — I10 Essential (primary) hypertension: Secondary | ICD-10-CM

## 2011-03-05 DIAGNOSIS — N529 Male erectile dysfunction, unspecified: Secondary | ICD-10-CM

## 2011-03-05 DIAGNOSIS — R972 Elevated prostate specific antigen [PSA]: Secondary | ICD-10-CM

## 2011-03-05 MED ORDER — SILDENAFIL CITRATE 100 MG PO TABS: 50.0000 mg | ORAL_TABLET | Freq: Every day | ORAL | Status: DC | PRN

## 2011-03-05 NOTE — Progress Notes (Signed)
Diabetes:  Using medications without difficulties:yes Hypoglycemic episodes: no Hyperglycemic episodes:no Feet problems:no Blood Sugars averaging: ~100  eye exam within last year: yes, ~6 months ago  Hypertension:    Using medication without problems or lightheadedness: yes Chest pain with exertion:no Edema:no Short of breath:no Average home BPs: 110-120s/60-70s  ED: asking about options.  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    Elevated Cholesterol: Using medications without problems:yes Muscle aches: no Diet compliance:yes, working on this Exercise:yes, working on this  PSA elevated.  H/o BPH for many years.  Stream is good.  Nocturia: 1-2 times per night.  We talked about options.  Neg FH for prostate CA.  No prev biopsy done. He's had prev elevation of PSA that resolved.    PMH and SH reviewed.   Vital signs, Meds and allergies reviewed.  ROS: See HPI.  Otherwise nontributory.   GEN: nad, alert and oriented HEENT: mucous membranes moist NECK: supple w/o LA CV: rrr PULM: ctab, no inc wob ABD: soft, +bs EXT: no edema SKIN: no acute rash Prostate gland firm and smooth, symmetric enlargement, but no nodularity, tenderness, mass, asymmetry or induration.  Diabetic foot exam: Normal inspection No skin breakdown No calluses  Normal DP pulses Normal sensation to light tough and monofilament Nails normal

## 2011-03-05 NOTE — Patient Instructions (Addendum)
Check on the coverage for the flu and pneumonia shot.  I would get both.   Recheck PSA in 3 months.   Try the viagra and let me know it it doesn't work or if you have troubles.   Recheck other labs before OV in summertime.   Take care.

## 2011-03-06 ENCOUNTER — Encounter: Payer: Self-pay | Admitting: Family Medicine

## 2011-03-06 NOTE — Assessment & Plan Note (Signed)
Controlled, no change in meds.  Work on diet and weight.

## 2011-03-06 NOTE — Assessment & Plan Note (Signed)
Controlled , no change in meds 

## 2011-03-06 NOTE — Assessment & Plan Note (Signed)
Offered uro eval vs recheck PSA in 3 months.  He prefer to recheck in 3 months.  This is reasonable.

## 2011-03-06 NOTE — Assessment & Plan Note (Signed)
Start viagra, d/w pt about routine use and potential ADE.  Call back as needed.

## 2011-04-29 ENCOUNTER — Other Ambulatory Visit: Payer: Self-pay | Admitting: Family Medicine

## 2011-07-31 ENCOUNTER — Other Ambulatory Visit: Payer: Self-pay | Admitting: Family Medicine

## 2011-08-01 NOTE — Telephone Encounter (Signed)
[facility] request Actos and Cozaar # 90 x 1 on each.

## 2011-08-10 ENCOUNTER — Other Ambulatory Visit: Payer: Self-pay | Admitting: *Deleted

## 2011-08-10 MED ORDER — DOXAZOSIN MESYLATE 8 MG PO TABS: 8.0000 mg | ORAL_TABLET | Freq: Every day | ORAL | Status: DC

## 2011-08-10 NOTE — Telephone Encounter (Signed)
Pt was in [place] and left medication at home, rx sent to pharmacy by e-script

## 2011-09-18 ENCOUNTER — Encounter: Payer: Self-pay | Admitting: Family Medicine

## 2011-09-18 ENCOUNTER — Ambulatory Visit (INDEPENDENT_AMBULATORY_CARE_PROVIDER_SITE_OTHER): Payer: BC Managed Care – PPO | Admitting: Family Medicine

## 2011-09-18 VITALS — BP 126/80 | HR 54 | Temp 97.8°F | Ht 71.0 in | Wt 210.0 lb

## 2011-09-18 DIAGNOSIS — E785 Hyperlipidemia, unspecified: Secondary | ICD-10-CM

## 2011-09-18 DIAGNOSIS — R972 Elevated prostate specific antigen [PSA]: Secondary | ICD-10-CM

## 2011-09-18 DIAGNOSIS — Z23 Encounter for immunization: Secondary | ICD-10-CM

## 2011-09-18 DIAGNOSIS — E119 Type 2 diabetes mellitus without complications: Secondary | ICD-10-CM

## 2011-09-18 DIAGNOSIS — Z87898 Personal history of other specified conditions: Secondary | ICD-10-CM

## 2011-09-18 DIAGNOSIS — I10 Essential (primary) hypertension: Secondary | ICD-10-CM

## 2011-09-18 LAB — COMPREHENSIVE METABOLIC PANEL
ALT: 19 U/L (ref 0–53)
Albumin: 4 g/dL (ref 3.5–5.2)
BUN: 23 mg/dL (ref 6–23)
CO2: 25 mEq/L (ref 19–32)
Calcium: 9.1 mg/dL (ref 8.4–10.5)
Chloride: 104 mEq/L (ref 96–112)
Creatinine, Ser: 1 mg/dL (ref 0.4–1.5)
GFR: 79.53 mL/min (ref >60.00)

## 2011-09-18 LAB — LIPID PANEL
Cholesterol: 134 mg/dL (ref 0–200)
Triglycerides: 60 mg/dL (ref 0.0–149.0)

## 2011-09-18 LAB — HEMOGLOBIN A1C: Hgb A1c MFr Bld: 6.1 % (ref 4.6–6.5)

## 2011-09-18 MED ORDER — DOXAZOSIN MESYLATE 8 MG PO TABS: 8.0000 mg | ORAL_TABLET | Freq: Every day | ORAL | Status: DC

## 2011-09-18 MED ORDER — SIMVASTATIN 40 MG PO TABS: 40.0000 mg | ORAL_TABLET | Freq: Every day | ORAL | Status: DC

## 2011-09-18 MED ORDER — PIOGLITAZONE HCL 30 MG PO TABS: 30.0000 mg | ORAL_TABLET | Freq: Every day | ORAL | Status: DC

## 2011-09-18 MED ORDER — LOSARTAN POTASSIUM 50 MG PO TABS: 50.0000 mg | ORAL_TABLET | Freq: Every day | ORAL | Status: DC

## 2011-09-18 MED ORDER — METFORMIN HCL 500 MG PO TABS: 500.0000 mg | ORAL_TABLET | Freq: Two times a day (BID) | ORAL | Status: DC

## 2011-09-18 NOTE — Progress Notes (Signed)
Diabetes:  Using medications without difficulties: yes Hypoglycemic episodes: no Hyperglycemic episodes:no Feet problems: no Blood Sugars averaging: 100-120 eye exam within last year: yes, last year Due for A1c.  See notes on labs.   Hypertension:    Using medication without problems or lightheadedness: yes Chest pain with exertion:no Edema:no Short of breath:no Average home BPs: 120s/80s  Elevated Cholesterol: Using medications without problems:yes Muscle aches: no Diet compliance: yes, discussed Exercise: yes  Prev PSA elevation.  Due for recheck.  Prev discussed.  Last OV note reviewed. No change in stream.  PSA had been in high normal range prev.    PMH and SH reviewed.   Vital signs, Meds and allergies reviewed.  ROS: See HPI.  Otherwise nontributory.   GEN: nad, alert and oriented HEENT: mucous membranes moist NECK: supple w/o LA CV: rrr.  no murmur PULM: ctab, no inc wob ABD: soft, +bs EXT: no edema SKIN: no acute rash  Diabetic foot exam: Normal inspection No skin breakdown No calluses  Normal DP pulses Normal sensation to light tough and monofilament Nails normal

## 2011-09-18 NOTE — Assessment & Plan Note (Signed)
With prev elevation in PSA.  Recheck PSA today, DRE deferred.  If sig elevation in PSA, would need consideration of uro referral.  D/w pt.

## 2011-09-18 NOTE — Assessment & Plan Note (Signed)
See notes on labs. Doing well on meds w/o adverse effect.  Continue to work on diet.

## 2011-09-18 NOTE — Assessment & Plan Note (Signed)
See notes on labs.  No symptoms or hyper or hypoglycemia.  Doing well on meds w/o adverse effect.  Continue to work on DM2 diet.

## 2011-09-18 NOTE — Patient Instructions (Addendum)
Take care.  Go to the lab on the way out.  We'll send a copy of your note and your labs.  Glad to see you.  Recheck in 6 months.  30 min visit.

## 2011-09-18 NOTE — Assessment & Plan Note (Signed)
See notes on labs. Doing well on meds w/o adverse effect, no elevated or low BPs.  Continue to work on diet.  Continue current meds.

## 2011-09-19 ENCOUNTER — Encounter: Payer: Self-pay | Admitting: Family Medicine

## 2011-09-20 ENCOUNTER — Encounter: Payer: Self-pay | Admitting: *Deleted

## 2011-09-25 ENCOUNTER — Telehealth: Payer: Self-pay | Admitting: Family Medicine

## 2011-09-25 NOTE — Telephone Encounter (Signed)
Please send a copy of this note to patient.   Thanks.    Jacob Ponce has no evidence of cardiovascular, neurological, renal or ophthalmological evidence of disease.

## 2011-09-26 NOTE — Telephone Encounter (Signed)
Note faxed to patient's private fax machine per his request.

## 2012-05-29 ENCOUNTER — Other Ambulatory Visit: Payer: Self-pay | Admitting: *Deleted

## 2012-05-29 MED ORDER — SILDENAFIL CITRATE 100 MG PO TABS: 50.0000 mg | ORAL_TABLET | Freq: Every day | ORAL | Status: DC | PRN

## 2012-09-08 ENCOUNTER — Telehealth: Payer: Self-pay | Admitting: Family Medicine

## 2012-09-08 NOTE — Telephone Encounter (Signed)
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  Patient is due for his physical this month.  Your next available for a physical is in October.  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  Can patient be seen this month for his physical?

## 2012-09-08 NOTE — Telephone Encounter (Signed)
Needs 30 min visit.  Bring the forms to the OV.  Labs ahead of time.  Thanks.

## 2012-09-15 ENCOUNTER — Other Ambulatory Visit: Payer: Self-pay | Admitting: Family Medicine

## 2012-09-15 DIAGNOSIS — Z125 Encounter for screening for malignant neoplasm of prostate: Secondary | ICD-10-CM

## 2012-09-15 DIAGNOSIS — E119 Type 2 diabetes mellitus without complications: Secondary | ICD-10-CM

## 2012-09-19 ENCOUNTER — Telehealth: Payer: Self-pay | Admitting: Family Medicine

## 2012-09-19 ENCOUNTER — Other Ambulatory Visit: Payer: BC Managed Care – PPO | Admitting: Family Medicine

## 2012-09-19 ENCOUNTER — Other Ambulatory Visit (INDEPENDENT_AMBULATORY_CARE_PROVIDER_SITE_OTHER): Payer: Medicare Other

## 2012-09-19 DIAGNOSIS — Z125 Encounter for screening for malignant neoplasm of prostate: Secondary | ICD-10-CM

## 2012-09-19 DIAGNOSIS — E119 Type 2 diabetes mellitus without complications: Secondary | ICD-10-CM

## 2012-09-19 LAB — COMPREHENSIVE METABOLIC PANEL
ALT: 25 U/L (ref 0–53)
Alkaline Phosphatase: 40 U/L (ref 39–117)
Creatinine, Ser: 1 mg/dL (ref 0.4–1.5)
Sodium: 138 mEq/L (ref 135–145)
Total Bilirubin: 1.1 mg/dL (ref 0.3–1.2)
Total Protein: 6.4 g/dL (ref 6.0–8.3)

## 2012-09-19 LAB — LIPID PANEL
Cholesterol: 128 mg/dL (ref 0–200)
HDL: 51.8 mg/dL (ref >39.00)
LDL Cholesterol: 61 mg/dL (ref 0–99)
VLDL: 14.8 mg/dL (ref 0.0–40.0)

## 2012-09-19 LAB — PSA: PSA: 3.51 ng/mL (ref 0.10–4.00)

## 2012-09-19 NOTE — Telephone Encounter (Signed)
Pt dropped off [employer] forms to be completed.  Best number to reach Davi is [phone].

## 2012-09-21 NOTE — Telephone Encounter (Signed)
He has OV schedule on the 20th.  We can address/discuss then.

## 2012-09-24 ENCOUNTER — Encounter: Payer: Self-pay | Admitting: Family Medicine

## 2012-09-24 ENCOUNTER — Ambulatory Visit (INDEPENDENT_AMBULATORY_CARE_PROVIDER_SITE_OTHER): Payer: Medicare Other | Admitting: Family Medicine

## 2012-09-24 VITALS — BP 118/68 | HR 75 | Temp 98.6°F | Ht 70.0 in | Wt 207.0 lb

## 2012-09-24 DIAGNOSIS — E785 Hyperlipidemia, unspecified: Secondary | ICD-10-CM

## 2012-09-24 DIAGNOSIS — E119 Type 2 diabetes mellitus without complications: Secondary | ICD-10-CM

## 2012-09-24 DIAGNOSIS — Z0000 Encounter for general adult medical examination without abnormal findings: Secondary | ICD-10-CM

## 2012-09-24 DIAGNOSIS — I10 Essential (primary) hypertension: Secondary | ICD-10-CM

## 2012-09-24 MED ORDER — SIMVASTATIN 40 MG PO TABS: 40.0000 mg | ORAL_TABLET | Freq: Every day | ORAL | Status: DC

## 2012-09-24 MED ORDER — GLUCOSE BLOOD VI STRP: ORAL_STRIP | Status: DC

## 2012-09-24 MED ORDER — LOSARTAN POTASSIUM 50 MG PO TABS: 50.0000 mg | ORAL_TABLET | Freq: Every day | ORAL | Status: DC

## 2012-09-24 MED ORDER — METFORMIN HCL 500 MG PO TABS: 500.0000 mg | ORAL_TABLET | Freq: Two times a day (BID) | ORAL | Status: DC

## 2012-09-24 MED ORDER — PIOGLITAZONE HCL 30 MG PO TABS: 30.0000 mg | ORAL_TABLET | Freq: Every day | ORAL | Status: DC

## 2012-09-24 MED ORDER — DOXAZOSIN MESYLATE 8 MG PO TABS: 8.0000 mg | ORAL_TABLET | Freq: Every day | ORAL | Status: DC

## 2012-09-24 NOTE — Progress Notes (Signed)
I have personally reviewed the Medicare Annual Wellness questionnaire and have noted 1. The patient's medical and social history 2. Their use of alcohol, tobacco or illicit drugs 3. Their current medications and supplements 4. The patient's functional ability including ADL's, fall risks, home safety risks and hearing or visual             impairment. 5. Diet and physical activities 6. Evidence for depression or mood disorders  The patients weight, height, BMI have been recorded in the chart and visual acuity is per eye clinic.  I have made referrals, counseling and provided education to the patient based review of the above and I have provided the pt with a written personalized care plan for preventive services.  See scanned forms.  Routine anticipatory guidance given to patient.  See health maintenance. Flu encouraged Shingles encouraged PNA 2013 Tetanus 2006 Colon up to date  Prostate cancer screening- PSA wnl Advance directive d/w pt. Wife designated if incapacitated.  Cognitive function addressed- see scanned forms- and if abnormal then additional documentation follows.   Diabetes:  Using medications without difficulties:yes Hypoglycemic episodes:no Hyperglycemic episodes:no Feet problems:no Blood Sugars averaging: 100-120 eye exam within last year:yes  Hypertension:    Using medication without problems or lightheadedness: yes Chest pain with exertion:no Edema:no Short of breath:no  Elevated Cholesterol: Using medications without problems:yes Muscle aches: no Diet compliance:yes Exercise:yes  PMH and SH reviewed.   Vital signs, Meds and allergies reviewed.  ROS: See HPI.  Otherwise nontributory.   GEN: nad, alert and oriented HEENT: mucous membranes moist NECK: supple w/o LA CV: rrr.  no murmur PULM: ctab, no inc wob ABD: soft, +bs EXT: no edema SKIN: no acute rash  Diabetic foot exam: Normal inspection No skin breakdown No calluses  Normal DP  pulses Normal sensation to light tough and monofilament Nails normal

## 2012-09-24 NOTE — Patient Instructions (Addendum)
Check with your insurance to see if they will cover the shingles shot. I would get a flu shot each fall.   I'll work on your forms in the meantime.  Take care.  Glad to see you.

## 2012-09-25 ENCOUNTER — Encounter: Payer: Self-pay | Admitting: Family Medicine

## 2012-09-25 NOTE — Assessment & Plan Note (Signed)
Controlled w/o lows, doing well.  A1c at goal.  Continue as is.

## 2012-09-25 NOTE — Assessment & Plan Note (Signed)
Controlled, continue current meds.   

## 2012-09-25 NOTE — Assessment & Plan Note (Signed)
Controlled, continue current meds. Labs wnl, reviewed.  No hypotension.

## 2012-09-25 NOTE — Assessment & Plan Note (Signed)
See scanned forms.  Routine anticipatory guidance given to patient.  See health maintenance. Flu encouraged Shingles encouraged PNA 2013 Tetanus 2006 Colon up to date  Prostate cancer screening- PSA wnl Advance directive d/w pt. Wife designated if incapacitated.  Cognitive function addressed- see scanned forms- and if abnormal then additional documentation follows.

## 2012-10-03 ENCOUNTER — Telehealth: Payer: Self-pay | Admitting: Family Medicine

## 2012-10-03 NOTE — Telephone Encounter (Signed)
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

## 2012-10-06 NOTE — Telephone Encounter (Signed)
Form done, please scan and attach a med/dose list.  Give to patient.  Thanks .

## 2012-10-07 NOTE — Telephone Encounter (Signed)
Med list printed and put with form; Lyla Son will notify pt and send for scanning.

## 2013-03-27 ENCOUNTER — Encounter: Payer: Self-pay | Admitting: Family Medicine

## 2013-03-27 ENCOUNTER — Ambulatory Visit (INDEPENDENT_AMBULATORY_CARE_PROVIDER_SITE_OTHER): Payer: Medicare Other | Admitting: Family Medicine

## 2013-03-27 VITALS — BP 134/82 | HR 61 | Temp 97.7°F | Wt 222.5 lb

## 2013-03-27 DIAGNOSIS — J3489 Other specified disorders of nose and nasal sinuses: Secondary | ICD-10-CM

## 2013-03-27 DIAGNOSIS — E119 Type 2 diabetes mellitus without complications: Secondary | ICD-10-CM

## 2013-03-27 LAB — HEMOGLOBIN A1C: HEMOGLOBIN A1C: 6.7 % — AB (ref 4.6–6.5)

## 2013-03-27 NOTE — Assessment & Plan Note (Signed)
Seems to be an environmental trigger.  He'll check FAA approved meds.  Allegra or claritin may be useful if not prohibited.  He'll call back as needed.

## 2013-03-27 NOTE — Progress Notes (Signed)
Pre visit review using our clinic review tool, if applicable. No additional management support is needed unless otherwise documented below in the visit note.  Diabetes:  Using medications without difficulties:yes Hypoglycemic episodes:no Hyperglycemic episodes:no Feet problems:no Blood Sugars averaging: 100-120 eye exam within last year: encouraged.   Weight is up.  Discussed diet and exercise.   Feels well except for post nasal gtt causing a cough.  Rhinorrhea.  Better with blowing his nose.  It started after moving into his new house.  It was better when he went out of town for a few days.    Meds, vitals, and allergies reviewed.   ROS: See HPI.  Otherwise negative.    GEN: nad, alert and oriented HEENT: mucous membranes moist, tm wnl, nasal exam wnl, OP wnl NECK: supple w/o LA CV: rrr. PULM: ctab, no inc wob ABD: soft, +bs EXT: no edema SKIN: no acute rash

## 2013-03-27 NOTE — Patient Instructions (Addendum)
Call about an eye exam.  Go to the lab on the way out.  We'll contact you with your lab report. Check with your insurance to see if they will cover the shingles shot. I would get a flu shot each fall.   See if claritin/loratadine and allegra is banned by the [employer] for flying.   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  Labs that day, come in fasting.  30 min appointment.

## 2013-03-27 NOTE — Assessment & Plan Note (Signed)
Likely controlled, recheck A1c today.  He agrees.  See notes on labs.

## 2013-08-10 ENCOUNTER — Ambulatory Visit (INDEPENDENT_AMBULATORY_CARE_PROVIDER_SITE_OTHER): Payer: Medicare Other | Admitting: Family Medicine

## 2013-08-10 ENCOUNTER — Encounter: Payer: Self-pay | Admitting: Family Medicine

## 2013-08-10 VITALS — BP 120/80 | HR 64 | Temp 97.8°F | Ht 70.5 in | Wt 209.8 lb

## 2013-08-10 DIAGNOSIS — I10 Essential (primary) hypertension: Secondary | ICD-10-CM

## 2013-08-10 DIAGNOSIS — Z87898 Personal history of other specified conditions: Secondary | ICD-10-CM

## 2013-08-10 DIAGNOSIS — E785 Hyperlipidemia, unspecified: Secondary | ICD-10-CM

## 2013-08-10 DIAGNOSIS — Z125 Encounter for screening for malignant neoplasm of prostate: Secondary | ICD-10-CM

## 2013-08-10 DIAGNOSIS — E119 Type 2 diabetes mellitus without complications: Secondary | ICD-10-CM

## 2013-08-10 LAB — COMPREHENSIVE METABOLIC PANEL
ALK PHOS: 39 U/L (ref 39–117)
ALT: 22 U/L (ref 0–53)
AST: 23 U/L (ref 0–37)
Albumin: 4.2 g/dL (ref 3.5–5.2)
BUN: 25 mg/dL — ABNORMAL HIGH (ref 6–23)
CO2: 26 mEq/L (ref 19–32)
Calcium: 9.9 mg/dL (ref 8.4–10.5)
Chloride: 108 mEq/L (ref 96–112)
Creatinine, Ser: 1.1 mg/dL (ref 0.4–1.5)
GFR: 71.54 mL/min (ref >60.00)
Glucose, Bld: 122 mg/dL — ABNORMAL HIGH (ref 70–99)
Potassium: 4 mEq/L (ref 3.5–5.1)
SODIUM: 141 meq/L (ref 135–145)
TOTAL PROTEIN: 6.3 g/dL (ref 6.0–8.3)
Total Bilirubin: 0.8 mg/dL (ref 0.2–1.2)

## 2013-08-10 LAB — LIPID PANEL
CHOL/HDL RATIO: 2
Cholesterol: 141 mg/dL (ref 0–200)
HDL: 60 mg/dL (ref >39.00)
LDL Cholesterol: 68 mg/dL (ref 0–99)
NonHDL: 81
Triglycerides: 63 mg/dL (ref 0.0–149.0)
VLDL: 12.6 mg/dL (ref 0.0–40.0)

## 2013-08-10 LAB — HEMOGLOBIN A1C: HEMOGLOBIN A1C: 6.3 % (ref 4.6–6.5)

## 2013-08-10 LAB — PSA, MEDICARE: PSA: 4.37 ng/ml — ABNORMAL HIGH (ref 0.10–4.00)

## 2013-08-10 MED ORDER — GLUCOSE BLOOD VI STRP: ORAL_STRIP | Status: DC

## 2013-08-10 MED ORDER — LOSARTAN POTASSIUM 50 MG PO TABS: 50.0000 mg | ORAL_TABLET | Freq: Every day | ORAL | Status: DC

## 2013-08-10 MED ORDER — METFORMIN HCL 500 MG PO TABS: 500.0000 mg | ORAL_TABLET | Freq: Two times a day (BID) | ORAL | Status: DC

## 2013-08-10 MED ORDER — PIOGLITAZONE HCL 30 MG PO TABS: 30.0000 mg | ORAL_TABLET | Freq: Every day | ORAL | Status: DC

## 2013-08-10 MED ORDER — SIMVASTATIN 40 MG PO TABS: 40.0000 mg | ORAL_TABLET | Freq: Every day | ORAL | Status: DC

## 2013-08-10 MED ORDER — DOXAZOSIN MESYLATE 8 MG PO TABS: 8.0000 mg | ORAL_TABLET | Freq: Every day | ORAL | Status: DC

## 2013-08-10 NOTE — Assessment & Plan Note (Signed)
D/w pt.  Recheck PSA today.  He is aware of potential false positives.

## 2013-08-10 NOTE — Assessment & Plan Note (Signed)
Controlled by home checks, see notes on labs.  Continue current meds.  No ADE on meds.

## 2013-08-10 NOTE — Patient Instructions (Addendum)
Go to the lab on the way out.  We'll contact you with your lab report.  Check with your insurance to see if they will cover the shingles shot. Take care.  I would get a flu shot each fall.   Schedule a medicare physical in about 6-7 months.   Glad to see you.

## 2013-08-10 NOTE — Assessment & Plan Note (Signed)
See notes on labs.  Continue current meds.  No ADE on meds.

## 2013-08-10 NOTE — Progress Notes (Signed)
Pre visit review using our clinic review tool, if applicable. No additional management support is needed unless otherwise documented below in the visit note.  Diabetes:  Using medications without difficulties: yes Hypoglycemic episodes:no Hyperglycemic episodes:no Feet problems:no Blood Sugars averaging: 110-120 usually in AM eye exam within last year: done 2 months ago.   Elevated Cholesterol: Using medications without problems:yes Muscle aches: no Diet compliance:yes Exercise: some  Hypertension:    Using medication without problems or lightheadedness: yes Chest pain with exertion:no Edema:no Short of breath:no  Prostate cancer screening and PSA options (with potential risks and benefits of testing vs not testing) were discussed along with recent recs/guidelines.  He opted for testing PSA at this point.  FAA eval.  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  No complications from meds, no hypoglycemia.  No syncope.  No presyncope.  No CP, SOB, BLE edema. No contraindication to flying.   PMH and SH reviewed  Meds, vitals, and allergies reviewed.   ROS: See HPI.  Otherwise negative.    GEN: nad, alert and oriented HEENT: mucous membranes moist NECK: supple w/o LA CV: rrr. PULM: ctab, no inc wob ABD: soft, +bs EXT: no edema SKIN: no acute rash  Diabetic foot exam: Normal inspection except for mildly dry skin No skin breakdown No calluses  Normal DP pulses Normal sensation to light touch and monofilament Nails thickened

## 2013-08-10 NOTE — Assessment & Plan Note (Signed)
See notes on labs.  Continue current meds.  No ADE on meds.   No low BPs.

## 2013-08-11 ENCOUNTER — Other Ambulatory Visit: Payer: Self-pay | Admitting: Family Medicine

## 2013-08-11 ENCOUNTER — Telehealth: Payer: Self-pay | Admitting: Family Medicine

## 2013-08-11 DIAGNOSIS — Z125 Encounter for screening for malignant neoplasm of prostate: Secondary | ICD-10-CM

## 2013-08-11 NOTE — Telephone Encounter (Signed)
Relevant patient education assigned to patient using Emmi. ° °

## 2013-09-30 ENCOUNTER — Telehealth: Payer: Self-pay | Admitting: Family Medicine

## 2013-09-30 NOTE — Telephone Encounter (Signed)
Place in your in box on your desk.

## 2013-09-30 NOTE — Telephone Encounter (Signed)
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

## 2013-09-30 NOTE — Telephone Encounter (Signed)
Form done, please send with a copy of med list.  Thanks.

## 2013-10-01 ENCOUNTER — Encounter: Payer: Self-pay | Admitting: *Deleted

## 2013-10-01 NOTE — Telephone Encounter (Signed)
Patient advised and asked that we mail the form to him.

## 2013-11-18 ENCOUNTER — Other Ambulatory Visit (INDEPENDENT_AMBULATORY_CARE_PROVIDER_SITE_OTHER): Payer: Medicare Other

## 2013-11-18 DIAGNOSIS — Z125 Encounter for screening for malignant neoplasm of prostate: Secondary | ICD-10-CM

## 2013-11-18 LAB — PSA, MEDICARE: PSA: 3.95 ng/mL (ref 0.10–4.00)

## 2014-03-07 ENCOUNTER — Other Ambulatory Visit: Payer: Self-pay | Admitting: Family Medicine

## 2014-03-07 DIAGNOSIS — E119 Type 2 diabetes mellitus without complications: Secondary | ICD-10-CM

## 2014-03-08 ENCOUNTER — Other Ambulatory Visit (INDEPENDENT_AMBULATORY_CARE_PROVIDER_SITE_OTHER): Payer: Medicare Other

## 2014-03-08 DIAGNOSIS — E119 Type 2 diabetes mellitus without complications: Secondary | ICD-10-CM | POA: Diagnosis not present

## 2014-03-08 LAB — COMPREHENSIVE METABOLIC PANEL
ALT: 20 U/L (ref 0–53)
AST: 23 U/L (ref 0–37)
Albumin: 4.5 g/dL (ref 3.5–5.2)
Alkaline Phosphatase: 47 U/L (ref 39–117)
BUN: 29 mg/dL — ABNORMAL HIGH (ref 6–23)
CALCIUM: 10 mg/dL (ref 8.4–10.5)
CO2: 26 mEq/L (ref 19–32)
Chloride: 107 mEq/L (ref 96–112)
Creatinine, Ser: 1.1 mg/dL (ref 0.40–1.50)
GFR: 69.93 mL/min (ref >60.00)
Glucose, Bld: 116 mg/dL — ABNORMAL HIGH (ref 70–99)
Potassium: 3.9 mEq/L (ref 3.5–5.1)
Sodium: 141 mEq/L (ref 135–145)
Total Bilirubin: 0.6 mg/dL (ref 0.2–1.2)
Total Protein: 6.8 g/dL (ref 6.0–8.3)

## 2014-03-08 LAB — LIPID PANEL
Cholesterol: 140 mg/dL (ref 0–200)
HDL: 60.9 mg/dL (ref >39.00)
LDL Cholesterol: 64 mg/dL (ref 0–99)
NonHDL: 79.1
Total CHOL/HDL Ratio: 2
Triglycerides: 75 mg/dL (ref 0.0–149.0)
VLDL: 15 mg/dL (ref 0.0–40.0)

## 2014-03-08 LAB — HEMOGLOBIN A1C: Hgb A1c MFr Bld: 6.6 % — ABNORMAL HIGH (ref 4.6–6.5)

## 2014-03-15 ENCOUNTER — Encounter: Payer: Medicare Other | Admitting: Family Medicine

## 2014-03-29 ENCOUNTER — Encounter: Payer: Self-pay | Admitting: Family Medicine

## 2014-03-29 ENCOUNTER — Ambulatory Visit (INDEPENDENT_AMBULATORY_CARE_PROVIDER_SITE_OTHER): Payer: Medicare Other | Admitting: Family Medicine

## 2014-03-29 VITALS — BP 108/70 | HR 66 | Temp 97.5°F | Ht 70.5 in | Wt 212.0 lb

## 2014-03-29 DIAGNOSIS — I10 Essential (primary) hypertension: Secondary | ICD-10-CM

## 2014-03-29 DIAGNOSIS — E119 Type 2 diabetes mellitus without complications: Secondary | ICD-10-CM

## 2014-03-29 DIAGNOSIS — Z7189 Other specified counseling: Secondary | ICD-10-CM

## 2014-03-29 DIAGNOSIS — Z0000 Encounter for general adult medical examination without abnormal findings: Secondary | ICD-10-CM

## 2014-03-29 DIAGNOSIS — E785 Hyperlipidemia, unspecified: Secondary | ICD-10-CM | POA: Diagnosis not present

## 2014-03-29 DIAGNOSIS — Z1211 Encounter for screening for malignant neoplasm of colon: Secondary | ICD-10-CM

## 2014-03-29 NOTE — Patient Instructions (Addendum)
Check with your insurance to see if they will cover the shingles shot. The tetanus shot is likely cheaper at the pharmacy- check with your insurance.   I would get a flu shot each fall.   Jacob Ponce will call about your referral. Bring in your FAA papers in 08/2014.  Labs ahead of a visit.   Take care.  Glad to see you.

## 2014-03-29 NOTE — Progress Notes (Signed)
Pre visit review using our clinic review tool, if applicable. No additional management support is needed unless otherwise documented below in the visit note.  I have personally reviewed the Medicare Annual Wellness questionnaire and have noted 1. The patient's medical and social history 2. Their use of alcohol, tobacco or illicit drugs 3. Their current medications and supplements 4. The patient's functional ability including ADL's, fall risks, home safety risks and hearing or visual             impairment. 5. Diet and physical activities 6. Evidence for depression or mood disorders  The patients weight, height, BMI have been recorded in the chart and visual acuity is per eye clinic.  I have made referrals, counseling and provided education to the patient based review of the above and I have provided the pt with a written personalized care plan for preventive services.  Provider list updated- see scanned forms.  Routine anticipatory guidance given to patient.  See health maintenance.  Flu encouraged Shingles d/w pt PNA 2013 Tetanus 2006, d/w pt.  Colonoscopy 2011, due for f/u later this year.  Prostate cancer screening up to date, done 2015, < 1 year ago Advance directive- wife designated if patient were incapacitated.  Cognitive function addressed- see scanned forms- and if abnormal then additional documentation follows.   Diabetes:  Using medications without difficulties:yes Hypoglycemic episodes:no Hyperglycemic episodes:no Feet problems:no Blood Sugars averaging: ~110 in AM eye exam within last year:yes  Hypertension:    Using medication without problems or lightheadedness: yes Chest pain with exertion:no Edema:no Short of breath:no  Elevated Cholesterol: Using medications without problems:yes Muscle aches: no Diet compliance:yes Exercise:yes Labs d/w pt.  A1c and lipids controlled.   PMH and SH reviewed.   Vital signs, Meds and allergies reviewed.  ROS: See HPI.   Otherwise nontributory.   GEN: nad, alert and oriented HEENT: mucous membranes moist NECK: supple w/o LA CV: rrr.  no murmur PULM: ctab, no inc wob ABD: soft, +bs EXT: no edema SKIN: no acute rash  Diabetic foot exam: Normal inspection No skin breakdown but dry skin noted.  No calluses  Normal DP pulses Normal sensation to light tough and monofilament Nails slightly thick

## 2014-03-30 DIAGNOSIS — Z7189 Other specified counseling: Secondary | ICD-10-CM | POA: Insufficient documentation

## 2014-03-30 NOTE — Assessment & Plan Note (Signed)
Controlled, continue as is.  Labs d/w pt. No change in meds.  Continue work on diet and exercise.  

## 2014-03-30 NOTE — Assessment & Plan Note (Signed)
Flu encouraged  Shingles d/w pt  PNA 2013  Tetanus 2006, d/w pt.  Colonoscopy 2011, due for f/u later this year.  Prostate cancer screening up to date, done 2015, < 1 year ago  Advance directive- wife designated if patient were incapacitated.  Cognitive function addressed- see scanned forms- and if abnormal then additional documentation follows.

## 2014-03-30 NOTE — Assessment & Plan Note (Signed)
Controlled, continue as is.  Labs d/w pt.  No change in meds. Continue work on diet and exercise. No ADE on meds.

## 2014-04-01 ENCOUNTER — Telehealth: Payer: Self-pay | Admitting: Family Medicine

## 2014-04-01 NOTE — Telephone Encounter (Signed)
Patient advised.

## 2014-04-01 NOTE — Telephone Encounter (Signed)
Please call pt.  Jacob Ponce checked and noted that patient is not due for colonoscopy until 11/29/2014.  I don't think I gave him a specific date at the OV.  GI will likely call him in the fall about scheduling.  Thanks.

## 2014-06-28 DIAGNOSIS — E119 Type 2 diabetes mellitus without complications: Secondary | ICD-10-CM | POA: Diagnosis not present

## 2014-06-28 DIAGNOSIS — H43391 Other vitreous opacities, right eye: Secondary | ICD-10-CM | POA: Diagnosis not present

## 2014-06-28 DIAGNOSIS — H02834 Dermatochalasis of left upper eyelid: Secondary | ICD-10-CM | POA: Diagnosis not present

## 2014-06-28 DIAGNOSIS — H02831 Dermatochalasis of right upper eyelid: Secondary | ICD-10-CM | POA: Diagnosis not present

## 2014-06-28 DIAGNOSIS — H2513 Age-related nuclear cataract, bilateral: Secondary | ICD-10-CM | POA: Diagnosis not present

## 2014-07-02 LAB — HM DIABETES EYE EXAM

## 2014-08-14 ENCOUNTER — Other Ambulatory Visit: Payer: Self-pay | Admitting: Family Medicine

## 2014-08-17 NOTE — Telephone Encounter (Signed)
Sent. Thanks.   

## 2014-08-31 ENCOUNTER — Ambulatory Visit (INDEPENDENT_AMBULATORY_CARE_PROVIDER_SITE_OTHER): Payer: Medicare Other | Admitting: Family Medicine

## 2014-08-31 ENCOUNTER — Encounter: Payer: Self-pay | Admitting: Family Medicine

## 2014-08-31 VITALS — BP 104/70 | HR 60 | Temp 98.2°F | Ht 70.5 in | Wt 206.5 lb

## 2014-08-31 DIAGNOSIS — I10 Essential (primary) hypertension: Secondary | ICD-10-CM | POA: Diagnosis not present

## 2014-08-31 DIAGNOSIS — E785 Hyperlipidemia, unspecified: Secondary | ICD-10-CM | POA: Diagnosis not present

## 2014-08-31 DIAGNOSIS — Z23 Encounter for immunization: Secondary | ICD-10-CM

## 2014-08-31 DIAGNOSIS — E119 Type 2 diabetes mellitus without complications: Secondary | ICD-10-CM | POA: Diagnosis not present

## 2014-08-31 MED ORDER — SIMVASTATIN 40 MG PO TABS: ORAL_TABLET | ORAL | Status: DC

## 2014-08-31 MED ORDER — METFORMIN HCL 500 MG PO TABS: ORAL_TABLET | ORAL | Status: DC

## 2014-08-31 MED ORDER — PIOGLITAZONE HCL 30 MG PO TABS: 30.0000 mg | ORAL_TABLET | Freq: Every day | ORAL | Status: DC

## 2014-08-31 MED ORDER — LOSARTAN POTASSIUM 50 MG PO TABS: 50.0000 mg | ORAL_TABLET | Freq: Every day | ORAL | Status: DC

## 2014-08-31 MED ORDER — DOXAZOSIN MESYLATE 8 MG PO TABS: ORAL_TABLET | ORAL | Status: DC

## 2014-08-31 NOTE — Progress Notes (Signed)
Pre visit review using our clinic review tool, if applicable. No additional management support is needed unless otherwise documented below in the visit note.  Diabetes:  Using medications without difficulties: yes Hypoglycemic episodes:no Hyperglycemic episodes:no Feet problems:no Blood Sugars averaging: ~90-110 usually in AM eye exam within last year: done ~2 months ago.   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 No complications from meds, no hypoglycemia. No syncope. No presyncope. No CP, SOB, BLE edema. No contraindication to flying.   PMH and SH reviewed  Meds, vitals, and allergies reviewed.   ROS: See HPI. Otherwise negative.   GEN: nad, alert and oriented HEENT: mucous membranes moist NECK: supple w/o LA CV: rrr. PULM: ctab, no inc wob ABD: soft, +bs EXT: no edema SKIN: no acute rash  Diabetic foot exam: Normal inspection except for mildly dry skin No skin breakdown No calluses  Normal DP pulses Normal sensation to light touch and monofilament Nails thickened

## 2014-08-31 NOTE — Patient Instructions (Addendum)
Check with your insurance to see if they will cover the shingles shot. The tetanus shot may be cheaper at the pharmacy.  Take care.  Glad to see you.  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

## 2014-09-01 LAB — BASIC METABOLIC PANEL
BUN: 27 mg/dL — ABNORMAL HIGH (ref 6–23)
CALCIUM: 9.9 mg/dL (ref 8.4–10.5)
CHLORIDE: 107 meq/L (ref 96–112)
CO2: 22 mEq/L (ref 19–32)
CREATININE: 1.01 mg/dL (ref 0.40–1.50)
GFR: 77.06 mL/min (ref >60.00)
GLUCOSE: 110 mg/dL — AB (ref 70–99)
Potassium: 4 mEq/L (ref 3.5–5.1)
Sodium: 139 mEq/L (ref 135–145)

## 2014-09-01 LAB — HEMOGLOBIN A1C: Hgb A1c MFr Bld: 6.1 % (ref 4.6–6.5)

## 2014-09-01 NOTE — Assessment & Plan Note (Signed)
Controlled by home checks, A1c pending. , no ADE on med.  Continue as is.  He agrees.  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 No complications from meds, no hypoglycemia. No syncope. No presyncope. No CP, SOB, BLE edema. No contraindication to flying.

## 2014-09-01 NOTE — Assessment & Plan Note (Signed)
Controlled, no ADE on med.  Continue as is.  He agrees.  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 No complications from meds, no hypoglycemia. No syncope. No presyncope. No CP, SOB, BLE edema. No contraindication to flying.

## 2014-09-01 NOTE — Assessment & Plan Note (Signed)
Prev controlled, no ADE on med.  Continue as is.  He agrees.

## 2014-09-02 ENCOUNTER — Encounter: Payer: Self-pay | Admitting: *Deleted

## 2014-09-16 ENCOUNTER — Telehealth: Payer: Self-pay | Admitting: Family Medicine

## 2014-09-16 NOTE — Telephone Encounter (Signed)
Forwarded to Dr. Ashok Cordia In Box.

## 2014-09-16 NOTE — Telephone Encounter (Signed)
Pt dropped off diabetes/hyperglycemia oral medication report to be completed. Pt is asking for form to be mailed once it is done. Form in Lugene's box.

## 2014-09-17 ENCOUNTER — Encounter: Payer: Self-pay | Admitting: *Deleted

## 2014-09-17 DIAGNOSIS — Z7689 Persons encountering health services in other specified circumstances: Secondary | ICD-10-CM

## 2014-09-17 NOTE — Telephone Encounter (Signed)
Form done. Thanks. 

## 2014-09-17 NOTE — Telephone Encounter (Signed)
Copy of form and charge sheet given to [facility].  Original mailed to patient as requested.

## 2014-11-16 ENCOUNTER — Other Ambulatory Visit: Payer: Self-pay | Admitting: Family Medicine

## 2015-04-12 LAB — FECAL OCCULT BLOOD, GUAIAC: Fecal Occult Blood: NEGATIVE

## 2015-04-21 ENCOUNTER — Encounter: Payer: Self-pay | Admitting: Family Medicine

## 2015-05-09 ENCOUNTER — Encounter: Payer: Self-pay | Admitting: Family Medicine

## 2015-05-09 ENCOUNTER — Ambulatory Visit (INDEPENDENT_AMBULATORY_CARE_PROVIDER_SITE_OTHER): Payer: Medicare Other | Admitting: Family Medicine

## 2015-05-09 ENCOUNTER — Ambulatory Visit (INDEPENDENT_AMBULATORY_CARE_PROVIDER_SITE_OTHER)
Admission: RE | Admit: 2015-05-09 | Discharge: 2015-05-09 | Disposition: A | Discharge status: Home / Self Care | Payer: Medicare Other | Source: Ambulatory Visit | Attending: Family Medicine | Admitting: Family Medicine

## 2015-05-09 VITALS — BP 122/72 | HR 72 | Temp 98.4°F | Ht 70.5 in | Wt 203.5 lb

## 2015-05-09 DIAGNOSIS — M545 Low back pain, unspecified: Secondary | ICD-10-CM

## 2015-05-09 DIAGNOSIS — M47816 Spondylosis without myelopathy or radiculopathy, lumbar region: Secondary | ICD-10-CM | POA: Diagnosis not present

## 2015-05-09 NOTE — Progress Notes (Signed)
Dr. Karleen Hampshire T. [place] Nolton, MD, CAQ [facility] [address] [place], [address] Phone: [phone] Fax: [phone]  05/09/2015  Patient: Jacob Ponce, MRN: [id number], DOB: 1940/08/09, 75 y.o.  Primary Physician:  Crawford Givens, MD   Chief Complaint  Patient presents with  . Back Pain    Lifting Safe x 1 week ago-heard pop   Subjective:   Jacob Ponce is a 75 y.o. very pleasant male patient who presents with the following: Back Pain  ongoing for approximately: 1 week The patient has had back pain before. The back pain is localized into the lumbar spine area. They also describe no radiculopathy.  Lifting an 80 pound pain, excruciating, instant pain. Put it down in a hurry. Sat down in a rocking chair, Felt better after a few minutes. Having some spasm at night.   No numbness or tingling. No bowel or bladder incontinence. No focal weakness. Prior interventions: advil Physical therapy: No Chiropractic manipulations: No Acupuncture: No Osteopathic manipulation: No Heat or cold: Minimal effect  Past Medical History, Surgical History, Family History, Medications, Allergies have been reviewed and updated if relevant.  Patient Active Problem List   Diagnosis Date Noted  . Advance care planning 03/30/2014  . Medicare annual wellness visit, subsequent 08/20/2010  . HLD (hyperlipidemia) 09/14/2009  . Essential hypertension 09/14/2009  . ORGANIC IMPOTENCE 09/14/2009  . BENIGN PROSTATIC HYPERTROPHY, MILD, HX OF 09/14/2009  . Diabetes mellitus without complication (HCC) 09/14/2009    Past Medical History  Diagnosis Date  . Hypertension   . Hyperlipidemia   . Diabetes mellitus     Type II, controlled  . Fracture 2011    Right proximal humerus, resolved w/o deficit  . History of cardiac cath 1999    and Tilt Table Test, both negative  . BPH (benign prostatic hyperplasia)   . Special screening for malignant neoplasms of other sites    . Impotence of organic origin     Past Surgical History  Procedure Laterality Date  . Tibia fracture surgery      Left, resolved without deficit  . Appendectomy    . Tonsillectomy      Social History   Social History  . Marital Status: Married    Spouse Name: N/A  . Number of Children: 4  . Years of Education: N/A   Occupational History  . Art gallery manager at [employer]   . Pilot    Social History Main Topics  . Smoking status: Former Smoker    Types: Cigarettes    Quit date: 02/06/1984  . Smokeless tobacco: Never Used     Comment: Smoked from 53 to 1986  . Alcohol Use: No  . Drug Use: No  . Sexual Activity: Not on file   Other Topics Concern  . Not on file   Social History Narrative   Undergrad and Masters at [employer].   Second marriage since 1987   4 children, 3 step-children   Flies small aircraft   Retired 2016    Family History  Problem Relation Age of Onset  . Cancer Father     colon  . Seizures Father     after head injury  . Colon cancer Father   . Prostate cancer Neg Hx   . Colon cancer Mother   . Arthritis Brother     No Known Allergies  Medication list reviewed and updated in full in [facility].  GEN: No fevers, chills. Nontoxic. Primarily  MSK c/o today. MSK: Detailed in the HPI GI: tolerating PO intake without difficulty Neuro: As above  Otherwise the pertinent positives of the ROS are noted above.    Objective:   Blood pressure 122/72, pulse 72, temperature 98.4 F (36.9 C), temperature source Oral, height 5' 10.5" (1.791 m), weight 203 lb 8 oz (92.307 kg).  Gen: Well-developed,well-nourished,in no acute distress; alert,appropriate and cooperative throughout examination HEENT: Normocephalic and atraumatic without obvious abnormalities.  Ears, externally no deformities Pulm: Breathing comfortably in no respiratory distress Range of motion at  the waist: Flexion, rotation and lateral bending: range of motion is relatively preserved  for age.  No echymosis or edema Rises to examination table with no difficulty Gait: minimally antalgic  Inspection/Deformity: No abnormality Paraspinus T:  l2-s1 b  B Ankle Dorsiflexion (L5,4): 5/5 B Great Toe Dorsiflexion (L5,4): 5/5 Heel Walk (L5): WNL Toe Walk (S1): WNL Rise/Squat (L4): WNL, mild pain  SENSORY B Medial Foot (L4): WNL B Dorsum (L5): WNL B Lateral (S1): WNL Light Touch: WNL Pinprick: WNL  REFLEXES Knee (L4): 2+ Ankle (S1): 2+  B SLR, seated: neg B SLR, supine: neg B FABER: neg B Reverse FABER: neg B Greater Troch: NT B Log Roll: neg B Stork: NT B Sciatic Notch: NT  Radiology: Dg Lumbar Spine Complete  05/10/2015  CLINICAL DATA:  Acute low back pain EXAM: LUMBAR SPINE - COMPLETE 4+ VIEW COMPARISON:  None. FINDINGS: Normal lumbar alignment. Negative for fracture or mass. No pars defect. Mild disc degeneration with disc space narrowing and early spurring at L2-3, L3-4, L4-5, L5-S1. Mild facet degeneration at L4-5. SI joints intact. IMPRESSION: Mild lumbar degenerative change.  No acute abnormality. Electronically Signed   By: Marlan Palauharles  Clark M.D.   On: 05/10/2015 08:21    Assessment and Plan:   Acute low back pain - Plan: DG Lumbar Spine Complete  >25 minutes spent in face to face time with patient, >50% spent in counselling or coordination of care: there is no evidence for compression fracture on his lumbar spine films.  There is some mild degenerative changes in the lumbar spine, and it is worse in the thoracic spine, where this is visualized on the L spine film.  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  Advil p.r.n., with a maximum dose of 800 mg p.o. T.i.d. Would be reasonable along with heat and massage. Anatomy reviewed.  No red flags are present.  Follow-up: prn  Orders Placed This Encounter    Procedures  . DG Lumbar Spine Complete    Signed,  Roxine Whittinghill T. Kwamane Whack, MD   Patient's Medications  New Prescriptions   No medications on file  Previous Medications   ASPIRIN 81 MG TABLET    Take 81 mg by mouth daily.     DOXAZOSIN (CARDURA) 8 MG TABLET    TAKE ONE TABLET AT BEDTIME.   GLUCOSE BLOOD TEST STRIP    Use as instructed, daily.  Dispense with 100 lancets (with 12 refills) concurrently.   LOSARTAN (COZAAR) 50 MG TABLET    Take 1 tablet (50 mg total) by mouth daily.   METFORMIN (GLUCOPHAGE) 500 MG TABLET    TAKE 1 TABLET TWICE DAILY WITH A MEAL.   PIOGLITAZONE (ACTOS) 30 MG TABLET    Take 1 tablet (30 mg total) by mouth daily.   SIMVASTATIN (  ZOCOR) 40 MG TABLET    TAKE ONE TABLET AT BEDTIME.  Modified Medications   No medications on file  Discontinued Medications   No medications on file

## 2015-05-09 NOTE — Progress Notes (Signed)
Pre visit review using our clinic review tool, if applicable. No additional management support is needed unless otherwise documented below in the visit note. 

## 2015-06-27 DIAGNOSIS — Z86010 Personal history of colonic polyps: Secondary | ICD-10-CM | POA: Diagnosis not present

## 2015-06-27 DIAGNOSIS — Z800 Family history of malignant neoplasm of digestive organs: Secondary | ICD-10-CM | POA: Diagnosis not present

## 2015-06-27 LAB — HM COLONOSCOPY

## 2015-07-01 DIAGNOSIS — H43391 Other vitreous opacities, right eye: Secondary | ICD-10-CM | POA: Diagnosis not present

## 2015-07-01 DIAGNOSIS — H5203 Hypermetropia, bilateral: Secondary | ICD-10-CM | POA: Diagnosis not present

## 2015-07-01 DIAGNOSIS — H2513 Age-related nuclear cataract, bilateral: Secondary | ICD-10-CM | POA: Diagnosis not present

## 2015-07-01 DIAGNOSIS — E119 Type 2 diabetes mellitus without complications: Secondary | ICD-10-CM | POA: Diagnosis not present

## 2015-07-01 DIAGNOSIS — H02831 Dermatochalasis of right upper eyelid: Secondary | ICD-10-CM | POA: Diagnosis not present

## 2015-07-01 LAB — HM DIABETES EYE EXAM

## 2015-07-29 ENCOUNTER — Encounter: Payer: Self-pay | Admitting: Family Medicine

## 2015-09-07 ENCOUNTER — Other Ambulatory Visit: Payer: Self-pay | Admitting: Family Medicine

## 2015-09-07 ENCOUNTER — Other Ambulatory Visit (INDEPENDENT_AMBULATORY_CARE_PROVIDER_SITE_OTHER): Payer: Medicare Other

## 2015-09-07 DIAGNOSIS — E119 Type 2 diabetes mellitus without complications: Secondary | ICD-10-CM | POA: Diagnosis not present

## 2015-09-07 DIAGNOSIS — Z125 Encounter for screening for malignant neoplasm of prostate: Secondary | ICD-10-CM

## 2015-09-07 LAB — COMPREHENSIVE METABOLIC PANEL
ALBUMIN: 4.3 g/dL (ref 3.5–5.2)
ALK PHOS: 39 U/L (ref 39–117)
ALT: 17 U/L (ref 0–53)
AST: 18 U/L (ref 0–37)
BUN: 22 mg/dL (ref 6–23)
CHLORIDE: 104 meq/L (ref 96–112)
CO2: 25 mEq/L (ref 19–32)
Calcium: 10.2 mg/dL (ref 8.4–10.5)
Creatinine, Ser: 0.99 mg/dL (ref 0.40–1.50)
GFR: 78.64 mL/min (ref >60.00)
Glucose, Bld: 104 mg/dL — ABNORMAL HIGH (ref 70–99)
POTASSIUM: 4.2 meq/L (ref 3.5–5.1)
SODIUM: 140 meq/L (ref 135–145)
TOTAL PROTEIN: 6.4 g/dL (ref 6.0–8.3)
Total Bilirubin: 0.7 mg/dL (ref 0.2–1.2)

## 2015-09-07 LAB — LIPID PANEL
CHOLESTEROL: 142 mg/dL (ref 0–200)
HDL: 56.5 mg/dL (ref >39.00)
LDL Cholesterol: 70 mg/dL (ref 0–99)
NonHDL: 85
Total CHOL/HDL Ratio: 3
Triglycerides: 76 mg/dL (ref 0.0–149.0)
VLDL: 15.2 mg/dL (ref 0.0–40.0)

## 2015-09-07 LAB — PSA, MEDICARE: PSA: 4.83 ng/ml — ABNORMAL HIGH (ref 0.10–4.00)

## 2015-09-07 LAB — HEMOGLOBIN A1C: Hgb A1c MFr Bld: 6.2 % (ref 4.6–6.5)

## 2015-09-12 ENCOUNTER — Encounter: Payer: Self-pay | Admitting: Family Medicine

## 2015-09-12 ENCOUNTER — Ambulatory Visit (INDEPENDENT_AMBULATORY_CARE_PROVIDER_SITE_OTHER): Payer: Medicare Other | Admitting: Family Medicine

## 2015-09-12 VITALS — BP 110/64 | HR 66 | Temp 97.7°F | Ht 71.0 in | Wt 203.0 lb

## 2015-09-12 DIAGNOSIS — E119 Type 2 diabetes mellitus without complications: Secondary | ICD-10-CM | POA: Diagnosis not present

## 2015-09-12 DIAGNOSIS — Z125 Encounter for screening for malignant neoplasm of prostate: Secondary | ICD-10-CM

## 2015-09-12 DIAGNOSIS — I10 Essential (primary) hypertension: Secondary | ICD-10-CM

## 2015-09-12 DIAGNOSIS — E785 Hyperlipidemia, unspecified: Secondary | ICD-10-CM | POA: Diagnosis not present

## 2015-09-12 DIAGNOSIS — N400 Enlarged prostate without lower urinary tract symptoms: Secondary | ICD-10-CM

## 2015-09-12 MED ORDER — LOSARTAN POTASSIUM 50 MG PO TABS: 50.0000 mg | ORAL_TABLET | Freq: Every day | ORAL | 3 refills | Status: DC

## 2015-09-12 MED ORDER — PIOGLITAZONE HCL 30 MG PO TABS: 30.0000 mg | ORAL_TABLET | Freq: Every day | ORAL | 3 refills | Status: DC

## 2015-09-12 MED ORDER — DOXAZOSIN MESYLATE 8 MG PO TABS: ORAL_TABLET | ORAL | 3 refills | Status: DC

## 2015-09-12 MED ORDER — SIMVASTATIN 40 MG PO TABS: ORAL_TABLET | ORAL | 3 refills | Status: DC

## 2015-09-12 MED ORDER — METFORMIN HCL 500 MG PO TABS: ORAL_TABLET | ORAL | 3 refills | Status: DC

## 2015-09-12 NOTE — Assessment & Plan Note (Signed)
PSA elevation.  D/w pt about options, given that he has had prior mild elevations that resolved on recheck.  He opted for recheck in about 6 months.  This is reasonable.  No LUTS.

## 2015-09-12 NOTE — Assessment & Plan Note (Signed)
Controlled, continue as is.  Labs d/w pt.  

## 2015-09-12 NOTE — Assessment & Plan Note (Signed)
Controlled, continue as is.  Labs d/w pt. See scanned forms for FAA.

## 2015-09-12 NOTE — Patient Instructions (Addendum)
Check with your insurance to see if they will cover the shingles and tetanus shots.  They may be cheaper at the pharmacy.  Take care.  Glad to see you.  Update me as needed.  Recheck in about 6 months, labs ahead of time.

## 2015-09-12 NOTE — Progress Notes (Signed)
Pre visit review using our clinic review tool, if applicable. No additional management support is needed unless otherwise documented below in the visit note. 

## 2015-09-12 NOTE — Progress Notes (Signed)
Diabetes:  Using medications without difficulties:yes Hypoglycemic episodes:no Hyperglycemic episodes:no Feet problems:no Blood Sugars averaging: 95-100 in AM eye exam within last year: yes See scanned forms for FAA.   Hypertension:    Using medication without problems or lightheadedness: yes Chest pain with exertion:no Edema:no Short of breath:no  PSA elevation.  D/w pt about options, given that he has had prior mild elevations that resolved on recheck.  He opted for recheck in about 6 months.  This is reasonable.  No LUTS.    Advance directive- wife designated if patient were incapacitated.   Elevated Cholesterol: Using medications without problems:yes Muscle aches: no Diet compliance:yes Exercise:yes, walking at night.  Labs d/w pt.    PMH and SH reviewed  Meds, vitals, and allergies reviewed.   ROS: Per HPI unless specifically indicated in ROS section   GEN: nad, alert and oriented HEENT: mucous membranes moist NECK: supple w/o LA CV: rrr. PULM: ctab, no inc wob ABD: soft, +bs EXT: no edema SKIN: no acute rash  Diabetic foot exam: Normal inspection No skin breakdown No calluses  Normal DP pulses Normal sensation to light touch and monofilament Nails thickened.

## 2016-03-12 ENCOUNTER — Other Ambulatory Visit: Payer: Medicare Other

## 2016-03-15 ENCOUNTER — Ambulatory Visit: Payer: Medicare Other | Admitting: Family Medicine

## 2016-03-20 ENCOUNTER — Other Ambulatory Visit (INDEPENDENT_AMBULATORY_CARE_PROVIDER_SITE_OTHER): Payer: Medicare Other

## 2016-03-20 DIAGNOSIS — Z125 Encounter for screening for malignant neoplasm of prostate: Secondary | ICD-10-CM

## 2016-03-20 DIAGNOSIS — E119 Type 2 diabetes mellitus without complications: Secondary | ICD-10-CM | POA: Diagnosis not present

## 2016-03-20 LAB — PSA, MEDICARE: PSA: 5.24 ng/ml — ABNORMAL HIGH (ref 0.10–4.00)

## 2016-03-20 LAB — HEMOGLOBIN A1C: Hgb A1c MFr Bld: 6.7 % — ABNORMAL HIGH (ref 4.6–6.5)

## 2016-03-22 ENCOUNTER — Encounter: Payer: Self-pay | Admitting: Family Medicine

## 2016-03-22 ENCOUNTER — Ambulatory Visit (INDEPENDENT_AMBULATORY_CARE_PROVIDER_SITE_OTHER): Payer: Medicare Other | Admitting: Family Medicine

## 2016-03-22 VITALS — BP 128/78 | HR 66 | Temp 98.5°F | Wt 204.5 lb

## 2016-03-22 DIAGNOSIS — Z125 Encounter for screening for malignant neoplasm of prostate: Secondary | ICD-10-CM

## 2016-03-22 DIAGNOSIS — E119 Type 2 diabetes mellitus without complications: Secondary | ICD-10-CM | POA: Diagnosis not present

## 2016-03-22 DIAGNOSIS — R972 Elevated prostate specific antigen [PSA]: Secondary | ICD-10-CM | POA: Diagnosis not present

## 2016-03-22 NOTE — Patient Instructions (Addendum)
Recheck PSA ina bout 3 months.   Work on M.D.C. Holdingsyour diet in the meantime.   Take care.  Glad to see you.  Physical in about 6 months.  Update me as needed.

## 2016-03-22 NOTE — Progress Notes (Signed)
Pre visit review using our clinic review tool, if applicable. No additional management support is needed unless otherwise documented below in the visit note. 

## 2016-03-22 NOTE — Progress Notes (Signed)
PSA elevation noted.  He took zyrtec and his urine stream was changed temporarily.  D/w pt.  He'll stop med.  Okay to try claritin as needed, assuming no ADE and not an issue with FAA, d/w pt.  No burning with urination, no blood in urine.   H/o PSA elevation noted, but this is the highest reading he had had.  No FH prostate cancer.   He declined uro referral.  He accepted recheck in 3 months.    Diabetes:  Using medications without difficulties:yes Hypoglycemic episodes:no Hyperglycemic episodes:no Feet problems:no Blood Sugars averaging: no sig elevations, no lows.  Usually 100-120.   eye exam within last year: yes A1c up some but still controlled.   Flu shot declined.  Encouraged, declined.    PMH and SH reviewed  Meds, vitals, and allergies reviewed.   ROS: Per HPI unless specifically indicated in ROS section   GEN: nad, alert and oriented HEENT: mucous membranes moist NECK: supple w/o LA CV: rrr. PULM: ctab, no inc wob ABD: soft, +bs EXT: no edema Prostate gland firm and smooth, no enlargement, nodularity, tenderness, mass, asymmetry or induration.

## 2016-03-23 DIAGNOSIS — R972 Elevated prostate specific antigen [PSA]: Secondary | ICD-10-CM | POA: Insufficient documentation

## 2016-03-23 NOTE — Assessment & Plan Note (Signed)
Rectal exam is unremarkable. He has had fluctuating PSA in the past. He does not have significant urinary symptoms at this point. He has had previous elevations PSA before that would resolve. This is the first time recently were he's had 2 consecutive escalating/high PSAs. Discussed with patient. I encouraged him to follow with urology at this point. He declined that. Risk-benefit discussed with patient. He wanted to recheck PSA in 3 months. We talked about the fact that he could have prostate cancer at this point and he understood. He still elected for recheck PSA in 3 months.

## 2016-03-23 NOTE — Assessment & Plan Note (Signed)
A1c up slightly still controlled. Discussed with patient. He's been work on diet. Okay for outpatient follow-up. Flu shot declined. >25 minutes spent in face to face time with patient, >50% spent in counselling or coordination of care.

## 2016-06-28 ENCOUNTER — Other Ambulatory Visit: Payer: Self-pay | Admitting: Family Medicine

## 2016-07-04 DIAGNOSIS — H524 Presbyopia: Secondary | ICD-10-CM | POA: Diagnosis not present

## 2016-07-04 DIAGNOSIS — E119 Type 2 diabetes mellitus without complications: Secondary | ICD-10-CM | POA: Diagnosis not present

## 2016-07-04 DIAGNOSIS — H52203 Unspecified astigmatism, bilateral: Secondary | ICD-10-CM | POA: Diagnosis not present

## 2016-07-04 DIAGNOSIS — H2513 Age-related nuclear cataract, bilateral: Secondary | ICD-10-CM | POA: Diagnosis not present

## 2016-07-04 DIAGNOSIS — H5203 Hypermetropia, bilateral: Secondary | ICD-10-CM | POA: Diagnosis not present

## 2016-07-04 LAB — HM DIABETES EYE EXAM

## 2016-07-10 ENCOUNTER — Encounter: Payer: Self-pay | Admitting: Family Medicine

## 2016-09-05 NOTE — Progress Notes (Signed)
Pre visit review using our clinic review tool, if applicable. No additional management support is needed unless otherwise documented below in the visit note. 

## 2016-09-09 ENCOUNTER — Other Ambulatory Visit: Payer: Self-pay | Admitting: Family Medicine

## 2016-09-09 DIAGNOSIS — E119 Type 2 diabetes mellitus without complications: Secondary | ICD-10-CM

## 2016-09-12 ENCOUNTER — Ambulatory Visit (INDEPENDENT_AMBULATORY_CARE_PROVIDER_SITE_OTHER): Payer: Medicare Other | Admitting: Family Medicine

## 2016-09-12 ENCOUNTER — Ambulatory Visit (INDEPENDENT_AMBULATORY_CARE_PROVIDER_SITE_OTHER): Payer: Medicare Other

## 2016-09-12 ENCOUNTER — Other Ambulatory Visit: Payer: Medicare Other

## 2016-09-12 ENCOUNTER — Ambulatory Visit: Payer: Medicare Other

## 2016-09-12 VITALS — BP 110/70 | HR 66 | Temp 98.5°F | Ht 70.5 in | Wt 191.2 lb

## 2016-09-12 DIAGNOSIS — I10 Essential (primary) hypertension: Secondary | ICD-10-CM | POA: Diagnosis not present

## 2016-09-12 DIAGNOSIS — E119 Type 2 diabetes mellitus without complications: Secondary | ICD-10-CM

## 2016-09-12 DIAGNOSIS — N400 Enlarged prostate without lower urinary tract symptoms: Secondary | ICD-10-CM | POA: Diagnosis not present

## 2016-09-12 DIAGNOSIS — M19079 Primary osteoarthritis, unspecified ankle and foot: Secondary | ICD-10-CM | POA: Diagnosis not present

## 2016-09-12 DIAGNOSIS — Z125 Encounter for screening for malignant neoplasm of prostate: Secondary | ICD-10-CM

## 2016-09-12 DIAGNOSIS — E785 Hyperlipidemia, unspecified: Secondary | ICD-10-CM | POA: Diagnosis not present

## 2016-09-12 DIAGNOSIS — Z0000 Encounter for general adult medical examination without abnormal findings: Secondary | ICD-10-CM

## 2016-09-12 LAB — LIPID PANEL
CHOLESTEROL: 122 mg/dL (ref 0–200)
HDL: 50.8 mg/dL (ref >39.00)
LDL CALC: 60 mg/dL (ref 0–99)
NonHDL: 71.6
TRIGLYCERIDES: 57 mg/dL (ref 0.0–149.0)
Total CHOL/HDL Ratio: 2
VLDL: 11.4 mg/dL (ref 0.0–40.0)

## 2016-09-12 LAB — COMPREHENSIVE METABOLIC PANEL
ALBUMIN: 4.3 g/dL (ref 3.5–5.2)
ALT: 17 U/L (ref 0–53)
AST: 16 U/L (ref 0–37)
Alkaline Phosphatase: 40 U/L (ref 39–117)
BUN: 28 mg/dL — ABNORMAL HIGH (ref 6–23)
CALCIUM: 9.7 mg/dL (ref 8.4–10.5)
CHLORIDE: 106 meq/L (ref 96–112)
CO2: 26 meq/L (ref 19–32)
CREATININE: 1 mg/dL (ref 0.40–1.50)
GFR: 77.52 mL/min (ref >60.00)
Glucose, Bld: 109 mg/dL — ABNORMAL HIGH (ref 70–99)
POTASSIUM: 4.3 meq/L (ref 3.5–5.1)
Sodium: 139 mEq/L (ref 135–145)
Total Bilirubin: 0.9 mg/dL (ref 0.2–1.2)
Total Protein: 6.3 g/dL (ref 6.0–8.3)

## 2016-09-12 LAB — PSA, MEDICARE: PSA: 5.56 ng/ml — ABNORMAL HIGH (ref 0.10–4.00)

## 2016-09-12 LAB — HEMOGLOBIN A1C: Hgb A1c MFr Bld: 6.3 % (ref 4.6–6.5)

## 2016-09-12 NOTE — Progress Notes (Signed)
Subjective:   Jacob Ponce is a 76 y.o. male who presents for Medicare Annual/Subsequent preventive examination.  Review of Systems:  N/A Cardiac Risk Factors include: advanced age (>2men, >58 women);male gender;dyslipidemia;diabetes mellitus;hypertension     Objective:    Vitals: BP 110/70 (BP Location: Right Arm, Patient Position: Sitting, Cuff Size: Normal)   Pulse 66   Temp 98.5 F (36.9 C) (Oral)   Ht 5' 10.5" (1.791 m) Comment: no shoes  Wt 191 lb 4 oz (86.8 kg)   SpO2 97%   BMI 27.05 kg/m   Body mass index is 27.05 kg/m.  Tobacco History  Smoking Status  . Former Smoker  . Types: Cigarettes  . Quit date: 02/06/1984  Smokeless Tobacco  . Never Used    Comment: Smoked from 1966 to 1986     Counseling given: No   Past Medical History:  Diagnosis Date  . BPH (benign prostatic hyperplasia)   . Diabetes mellitus    Type II, controlled  . Fracture 2011   Right proximal humerus, resolved w/o deficit  . History of cardiac cath 1999   and Tilt Table Test, both negative  . Hyperlipidemia   . Hypertension   . Impotence of organic origin   . Special screening for malignant neoplasms of other sites    Past Surgical History:  Procedure Laterality Date  . APPENDECTOMY    . TIBIA FRACTURE SURGERY     Left, resolved without deficit  . TONSILLECTOMY     Family History  Problem Relation Age of Onset  . Cancer Father        colon  . Seizures Father        after head injury  . Colon cancer Father   . Colon cancer Mother   . Arthritis Brother   . Prostate cancer Neg Hx    History  Sexual Activity  . Sexual activity: No    Outpatient Encounter Prescriptions as of 09/12/2016  Medication Sig  . aspirin 81 MG tablet Take 81 mg by mouth daily.    Marland Kitchen doxazosin (CARDURA) 8 MG tablet TAKE ONE TABLET AT BEDTIME.  Marland Kitchen glucose blood (ONE TOUCH ULTRA TEST) test strip Use to check blood sugar daily.  Dx: E11.9  . Lancets (ONETOUCH ULTRASOFT) lancets Use to check blood  sugar daily.  Dx: E11.9  . loratadine (CLARITIN) 10 MG tablet Take 10 mg by mouth daily as needed for allergies.  Marland Kitchen losartan (COZAAR) 50 MG tablet Take 1 tablet (50 mg total) by mouth daily.  . metFORMIN (GLUCOPHAGE) 500 MG tablet TAKE 1 TABLET TWICE DAILY WITH A MEAL.  . pioglitazone (ACTOS) 30 MG tablet Take 1 tablet (30 mg total) by mouth daily.  . simvastatin (ZOCOR) 40 MG tablet TAKE ONE TABLET AT BEDTIME.   No facility-administered encounter medications on file as of 09/12/2016.     Activities of Daily Living In your present state of health, do you have any difficulty performing the following activities: 09/12/2016  Hearing? N  Vision? N  Difficulty concentrating or making decisions? N  Walking or climbing stairs? N  Dressing or bathing? N  Doing errands, shopping? N  Preparing Food and eating ? N  Using the Toilet? N  In the past six months, have you accidently leaked urine? N  Do you have problems with loss of bowel control? N  Managing your Medications? N  Managing your Finances? N  Housekeeping or managing your Housekeeping? N  Some recent data might be hidden  Patient Care Team: Joaquim Namuncan, Graham S, MD as PCP - General   Assessment:     Hearing Screening   125Hz  250Hz  500Hz  1000Hz  2000Hz  3000Hz  4000Hz  6000Hz  8000Hz   Right ear:   40 0 0  0    Left ear:   40 40 40  0    Vision Screening Comments: Last vision exam in June 2018   Exercise Activities and Dietary recommendations Current Exercise Habits: Home exercise routine, Type of exercise: walking, Time (Minutes): 30, Frequency (Times/Week): 4, Weekly Exercise (Minutes/Week): 120, Intensity: Mild, Exercise limited by: None identified  Goals    . Increase physical activity          When schedule permits, I will attempt for exercise for 60 min daily.       Fall Risk Fall Risk  09/12/2016 03/29/2014 08/10/2013  Falls in the past year? No Yes No  Number falls in past yr: - 1 -   Depression Screen PHQ 2/9 Scores  09/12/2016 03/29/2014 08/10/2013  PHQ - 2 Score 0 0 0    Cognitive Function MMSE - Mini Mental State Exam 09/12/2016  Orientation to time 5  Orientation to Place 5  Registration 3  Attention/ Calculation 0  Recall 3  Language- name 2 objects 0  Language- repeat 1  Language- follow 3 step command 3  Language- read & follow direction 0  Write a sentence 0  Copy design 0  Total score 20     PLEASE NOTE: A Mini-Cog screen was completed. Maximum score is 20. A value of 0 denotes this part of Folstein MMSE was not completed or the patient failed this part of the Mini-Cog screening.   Mini-Cog Screening Orientation to Time - Max 5 pts Orientation to Place - Max 5 pts Registration - Max 3 pts Recall - Max 3 pts Language Repeat - Max 1 pts Language Follow 3 Step Command - Max 3 pts     Immunization History  Administered Date(s) Administered  . Pneumococcal Conjugate-13 08/31/2014  . Pneumococcal Polysaccharide-23 02/06/2004, 09/18/2011  . Td 02/06/2004   Screening Tests Health Maintenance  Topic Date Due  . TETANUS/TDAP  02/05/2024 (Originally 02/05/2014)  . INFLUENZA VACCINE  09/12/2048 (Originally 09/05/2016)  . HEMOGLOBIN A1C  09/17/2016  . OPHTHALMOLOGY EXAM  07/04/2017  . FOOT EXAM  09/12/2017  . COLONOSCOPY  06/26/2020  . PNA vac Low Risk Adult  Completed      Plan:     I have personally reviewed and addressed the Medicare Annual Wellness questionnaire and have noted the following in the patient's chart:  A. Medical and social history B. Use of alcohol, tobacco or illicit drugs  C. Current medications and supplements D. Functional ability and status E.  Nutritional status F.  Physical activity G. Advance directives H. List of other physicians I.  Hospitalizations, surgeries, and ER visits in previous 12 months J.  Vitals K. Screenings to include hearing, vision, cognitive, depression L. Referrals and appointments - none  In addition, I have reviewed and discussed  with patient certain preventive protocols, quality metrics, and best practice recommendations. A written personalized care plan for preventive services as well as general preventive health recommendations were provided to patient.  See attached scanned questionnaire for additional information.   Signed,   Randa EvensLesia Bryannah Boston, MHA, BS, LPN Health Coach

## 2016-09-12 NOTE — Progress Notes (Signed)
PCP notes:   Health maintenance:  Tetanus - postponed/insurance Flu vaccine - pt declined Foot exam - PCP please address at next appt  Abnormal screenings:   Hearing - failed  Patient concerns:   A. Pt has concerns with pain in left big toe. Pain scale: 1/10. Onset: approx. 1 wk ago.  Nurse concerns:  None  Next PCP appt:   09/12/16 @ 1500  I reviewed health advisor's note, was available for consultation on the day of service listed in this note, and agree with documentation and plan. Crawford GivensGraham Duncan, MD.

## 2016-09-12 NOTE — Patient Instructions (Signed)
[name] , Thank you for taking time to come for your Medicare Wellness Visit. I appreciate your ongoing commitment to your health goals. Please review the following plan we discussed and let me know if I can assist you in the future.   These are the goals we discussed: Goals    . Increase physical activity          When schedule permits, I will attempt for exercise for 60 min daily.        This is a list of the screening recommended for you and due dates:  Health Maintenance  Topic Date Due  . Tetanus Vaccine  02/05/2024*  . Flu Shot  09/12/2048*  . Hemoglobin A1C  09/17/2016  . Eye exam for diabetics  07/04/2017  . Complete foot exam   09/12/2017  . Colon Cancer Screening  06/26/2020  . Pneumonia vaccines  Completed  *Topic was postponed. The date shown is not the original due date.   Preventive Care for Adults  A healthy lifestyle and preventive care can promote health and wellness. Preventive health guidelines for adults include the following key practices.  . A routine yearly physical is a good way to check with your health care provider about your health and preventive screening. It is a chance to share any concerns and updates on your health and to receive a thorough exam.  . Visit your dentist for a routine exam and preventive care every 6 months. Brush your teeth twice a day and floss once a day. Good oral hygiene prevents tooth decay and gum disease.  . The frequency of eye exams is based on your age, health, family medical history, use  of contact lenses, and other factors. Follow your health care provider's ecommendations for frequency of eye exams.  . Eat a healthy diet. Foods like vegetables, fruits, whole grains, low-fat dairy products, and lean protein foods contain the nutrients you need without too many calories. Decrease your intake of foods high in solid fats, added sugars, and salt. Eat the right amount of calories for you. Get information about a proper diet  from your health care provider, if necessary.  . Regular physical exercise is one of the most important things you can do for your health. Most adults should get at least 150 minutes of moderate-intensity exercise (any activity that increases your heart rate and causes you to sweat) each week. In addition, most adults need muscle-strengthening exercises on 2 or more days a week.  Silver Sneakers may be a benefit available to you. To determine eligibility, you may visit the website: www.silversneakers.com or contact program at (867)079-88541-9727585421 Mon-Fri between 8AM-8PM.   . Maintain a healthy weight. The body mass index (BMI) is a screening tool to identify possible weight problems. It provides an estimate of body fat based on height and weight. Your health care provider can find your BMI and can help you achieve or maintain a healthy weight.   For adults 20 years and older: ? A BMI below 18.5 is considered underweight. ? A BMI of 18.5 to 24.9 is normal. ? A BMI of 25 to 29.9 is considered overweight. ? A BMI of 30 and above is considered obese.   . Maintain normal blood lipids and cholesterol levels by exercising and minimizing your intake of saturated fat. Eat a balanced diet with plenty of fruit and vegetables. Blood tests for lipids and cholesterol should begin at age 74 and be repeated every 5 years. If your lipid or  cholesterol levels are high, you are over 50, or you are at high risk for heart disease, you may need your cholesterol levels checked more frequently. Ongoing high lipid and cholesterol levels should be treated with medicines if diet and exercise are not working.  . If you smoke, find out from your health care provider how to quit. If you do not use tobacco, please do not start.  . If you choose to drink alcohol, please do not consume more than 2 drinks per day. One drink is considered to be 12 ounces (355 mL) of beer, 5 ounces (148 mL) of wine, or 1.5 ounces (44 mL) of liquor.  .  If you are 2-14 years old, ask your health care provider if you should take aspirin to prevent strokes.  . Use sunscreen. Apply sunscreen liberally and repeatedly throughout the day. You should seek shade when your shadow is shorter than you. Protect yourself by wearing long sleeves, pants, a wide-brimmed hat, and sunglasses year round, whenever you are outdoors.  . Once a month, do a whole body skin exam, using a mirror to look at the skin on your back. Tell your health care provider of new moles, moles that have irregular borders, moles that are larger than a pencil eraser, or moles that have changed in shape or color.

## 2016-09-13 ENCOUNTER — Encounter: Payer: Self-pay | Admitting: *Deleted

## 2016-09-13 ENCOUNTER — Telehealth: Payer: Self-pay | Admitting: Family Medicine

## 2016-09-13 ENCOUNTER — Encounter: Payer: Self-pay | Admitting: Family Medicine

## 2016-09-13 DIAGNOSIS — Z0000 Encounter for general adult medical examination without abnormal findings: Secondary | ICD-10-CM | POA: Insufficient documentation

## 2016-09-13 DIAGNOSIS — M19079 Primary osteoarthritis, unspecified ankle and foot: Secondary | ICD-10-CM | POA: Insufficient documentation

## 2016-09-13 NOTE — Assessment & Plan Note (Signed)
Recheck PSA pending.  See notes on labs. 

## 2016-09-13 NOTE — Assessment & Plan Note (Signed)
No contraindication to flying.  No lows.  See notes on labs.  Compliant with meds.

## 2016-09-13 NOTE — Assessment & Plan Note (Signed)
rx given to patient for Tdap to be done at the pharmacy.  Shingles shot d/w pt.  Flu shot encouraged.

## 2016-09-13 NOTE — Assessment & Plan Note (Signed)
Continue as is.  See notes on labs.  Compliant with meds.

## 2016-09-13 NOTE — Progress Notes (Signed)
Diabetes:  Using medications without difficulties:yes Hypoglycemic episodes:no Hyperglycemic episodes:no Feet problems:no neuropathy or DM2 related issues.  Blood Sugars averaging: 98-136 in the AM.  eye exam within last year: yes Labs pending.   Hypertension:    Using medication without problems or lightheadedness: yes Chest pain with exertion:no Edema:no Short of breath:no  Elevated Cholesterol: Using medications without problems:yes Muscle aches: no Diet compliance:yes Exercise:encouraged.  Labs pending.   H/o PSA elevation, due for recheck today.  See notes on labs.    rx given to patient for Tdap to be done at the pharmacy.  Shingles shot d/w pt.  Flu shot encouraged.   Mild episodic L 1st MTP pain, not severe, not likely prev gout flare.  No trauma.  Able to bear weight.  Recently noted.  No trauma.    PMH and SH reviewed.   Vital signs, Meds and allergies reviewed.  ROS: Per HPI unless specifically indicated in ROS section   GEN: nad, alert and oriented HEENT: mucous membranes moist NECK: supple w/o LA CV: rrr.  no murmur PULM: ctab, no inc wob ABD: soft, +bs EXT: no edema SKIN: no acute rash  Diabetic foot exam: Normal inspection No skin breakdown No calluses  Normal DP pulses Normal sensation to light tough and monofilament Nails normal L1st MTP with normal ROM and inspection.

## 2016-09-13 NOTE — Assessment & Plan Note (Signed)
Vs tendonitis.  Mild sx.  Supportive tx, ie hard soled shoe and update me as needed.

## 2016-09-13 NOTE — Telephone Encounter (Signed)
Caller Name:self Relationship to Patient self : Best [phone] Pharmacy:  Reason for call:  Returning call about labs(?)

## 2016-09-13 NOTE — Assessment & Plan Note (Signed)
Controlled. No contraindication to flying.  No low BPs.   See notes on labs.  Compliant with meds.

## 2016-10-01 ENCOUNTER — Telehealth: Payer: Self-pay | Admitting: Family Medicine

## 2016-10-01 NOTE — Telephone Encounter (Signed)
Placed in Dr. Duncan's In Box. 

## 2016-10-01 NOTE — Telephone Encounter (Signed)
I'll work on the hard copy.  Thanks.  

## 2016-10-01 NOTE — Telephone Encounter (Signed)
Pt dropped off form to be filled out.  °

## 2016-10-02 NOTE — Telephone Encounter (Signed)
PPW faxed to fax number provided. [phone]

## 2016-10-09 ENCOUNTER — Telehealth: Payer: Self-pay | Admitting: Family Medicine

## 2016-10-09 DIAGNOSIS — R972 Elevated prostate specific antigen [PSA]: Secondary | ICD-10-CM

## 2016-10-09 NOTE — Telephone Encounter (Signed)
Patient called about referral to urology.  Patient said he'd like to go to [place] and he can go anytime.

## 2016-10-10 NOTE — Telephone Encounter (Signed)
I don't know why this wasn't already done.  I put in the referral.  I called patient and LMOVM, but didn't leave confidential info.  He did the right thing to call back and ask and I appreciate that.

## 2016-10-10 NOTE — Telephone Encounter (Signed)
Called the patient and will call [facility] tomorrow and call patient back with Appt info.

## 2016-10-10 NOTE — Telephone Encounter (Signed)
See note from Dr. Duncan 

## 2016-10-10 NOTE — Telephone Encounter (Signed)
Thanks

## 2016-11-11 ENCOUNTER — Other Ambulatory Visit: Payer: Self-pay | Admitting: Family Medicine

## 2016-11-13 ENCOUNTER — Other Ambulatory Visit: Payer: Self-pay | Admitting: *Deleted

## 2016-11-13 MED ORDER — PIOGLITAZONE HCL 30 MG PO TABS: 30.0000 mg | ORAL_TABLET | Freq: Every day | ORAL | 3 refills | Status: DC

## 2016-11-13 MED ORDER — DOXAZOSIN MESYLATE 8 MG PO TABS: 8.0000 mg | ORAL_TABLET | Freq: Every day | ORAL | 3 refills | Status: DC

## 2016-11-13 MED ORDER — LOSARTAN POTASSIUM 50 MG PO TABS: 50.0000 mg | ORAL_TABLET | Freq: Every day | ORAL | 3 refills | Status: DC

## 2016-11-13 MED ORDER — METFORMIN HCL 500 MG PO TABS: ORAL_TABLET | ORAL | 3 refills | Status: DC

## 2016-11-13 NOTE — Telephone Encounter (Signed)
Pt calls and thought his refills would have been done on 09/12/16; I apologized and refilled meds per protocol to [facility]; pt voiced understanding.

## 2016-11-16 DIAGNOSIS — R972 Elevated prostate specific antigen [PSA]: Secondary | ICD-10-CM | POA: Diagnosis not present

## 2017-02-07 ENCOUNTER — Other Ambulatory Visit: Payer: Self-pay | Admitting: *Deleted

## 2017-02-07 ENCOUNTER — Telehealth: Payer: Self-pay | Admitting: Family Medicine

## 2017-02-07 MED ORDER — GLUCOSE BLOOD VI STRP: ORAL_STRIP | 3 refills | Status: DC

## 2017-02-07 MED ORDER — ONETOUCH ULTRASOFT LANCETS MISC: 3 refills | Status: DC

## 2017-02-07 NOTE — Telephone Encounter (Signed)
Copied from CRM [phone]. Topic: Quick Communication - Rx Refill/Question >> Feb 07, 2017  3:38 PM Stephannie LiSimmons, Treon Kehl L, NT wrote: Has the patient contacted their pharmacy?  (Agent: If no, request that the patient contact the pharmacy for the refill.) Preferred Pharmacy (with phone number [facility] street name[facility] [address] [facility] [place] Agent: Please be advised that RX refills may take up to 3 business days. We ask that you follow-up with your pharmacy. Patient needs a refill for lancets, and  1 touch ultra test strips, sent to the new pharmacy please call [phone]

## 2017-02-07 NOTE — Telephone Encounter (Signed)
OV 09/12/16 [facility] [address] [facility] [place]

## 2017-07-08 ENCOUNTER — Ambulatory Visit: Payer: Self-pay | Admitting: *Deleted

## 2017-07-08 NOTE — Telephone Encounter (Signed)
Pt reports one week ago had "Bad stomach" with mild diarrhea, stool "pale in color." Resolved Thursday; states BM this am WNL.  Pt reports Friday night developed mild lightheadedness, brief episodes. States Saturday when out of town, lightheadedness severe at times "Everything went grey a couple times." denies any CP, SOB, fever.  Reports mild lightheadedness yesterday at times, denies any today. Also reports BS higher than usual 120's-130's. This am 136. Denies fever, CP, no other symptoms. States has been staying hydrated. 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 Appt made for tomorrow AM with Dr. Para Marchuncan. Care advise given per protocol. Advised to go to ED if symptoms reoccur or worsen.   Reason for Disposition . [1] MODERATE dizziness (e.g., interferes with normal activities) AND [2] has NOT been evaluated by physician for this  (Exception: dizziness caused by heat exposure, sudden standing, or poor fluid intake)  Answer Assessment - Initial Assessment Questions 1. DESCRIPTION: "Describe your dizziness."     Lightheaded, not presently 2. LIGHTHEADED: "Do you feel lightheaded?" (e.g., somewhat faint, woozy, weak upon standing)     Not presently 3. VERTIGO: "Do you feel like either you or the room is spinning or tilting?" (i.e. vertigo)     no 4. SEVERITY: "How bad is it?"  "Do you feel like you are going to faint?" "Can you stand and walk?"   - MILD - walking normally   - MODERATE - interferes with normal activities (e.g., work, school)    - SEVERE - unable to stand, requires support to walk, feels like passing out now.      Severe Saturday, mild episodes Friday night 5. ONSET:  "When did the dizziness begin?"     Friday night "Mild" 6. AGGRAVATING FACTORS: "Does anything make it worse?" (e.g., standing, change in head position)      7. HEART RATE: "Can you tell me your heart rate?" "How many beats in 15 seconds?"  (Note: not all patients can do this)        8. CAUSE:  "What do you think is causing the dizziness?"     Unsure 9. RECURRENT SYMPTOM: "Have you had dizziness before?" If so, ask: "When was the last time?" "What happened that time?"     no 10. OTHER SYMPTOMS: "Do you have any other symptoms?" (e.g., fever, chest pain, vomiting, diarrhea, bleeding)     Had loose stools 1 week ago; resolved.  Protocols used: DIZZINESS [facility]- LIGHTHEADEDNESS-A-AH

## 2017-07-09 ENCOUNTER — Ambulatory Visit (INDEPENDENT_AMBULATORY_CARE_PROVIDER_SITE_OTHER): Payer: Medicare Other | Admitting: Family Medicine

## 2017-07-09 ENCOUNTER — Encounter: Payer: Self-pay | Admitting: Family Medicine

## 2017-07-09 VITALS — BP 102/62 | HR 73 | Temp 97.5°F | Ht 70.5 in | Wt 187.8 lb

## 2017-07-09 DIAGNOSIS — I10 Essential (primary) hypertension: Secondary | ICD-10-CM

## 2017-07-09 DIAGNOSIS — R42 Dizziness and giddiness: Secondary | ICD-10-CM

## 2017-07-09 DIAGNOSIS — E119 Type 2 diabetes mellitus without complications: Secondary | ICD-10-CM | POA: Diagnosis not present

## 2017-07-09 LAB — CBC WITH DIFFERENTIAL/PLATELET
BASOS ABS: 0.1 10*3/uL (ref 0.0–0.1)
Basophils Relative: 0.7 % (ref 0.0–3.0)
EOS ABS: 0.2 10*3/uL (ref 0.0–0.7)
Eosinophils Relative: 1.9 % (ref 0.0–5.0)
HEMATOCRIT: 42.6 % (ref 39.0–52.0)
Hemoglobin: 14.7 g/dL (ref 13.0–17.0)
LYMPHS PCT: 20.1 % (ref 12.0–46.0)
Lymphs Abs: 1.8 10*3/uL (ref 0.7–4.0)
MCHC: 34.4 g/dL (ref 30.0–36.0)
MCV: 88.2 fl (ref 78.0–100.0)
Monocytes Absolute: 1 10*3/uL (ref 0.1–1.0)
Monocytes Relative: 11.5 % (ref 3.0–12.0)
NEUTROS ABS: 5.8 10*3/uL (ref 1.4–7.7)
Neutrophils Relative %: 65.8 % (ref 43.0–77.0)
PLATELETS: 491 10*3/uL — AB (ref 150.0–400.0)
RBC: 4.83 Mil/uL (ref 4.22–5.81)
RDW: 14.6 % (ref 11.5–15.5)
WBC: 8.9 10*3/uL (ref 4.0–10.5)

## 2017-07-09 LAB — COMPREHENSIVE METABOLIC PANEL
ALT: 21 U/L (ref 0–53)
AST: 23 U/L (ref 0–37)
Albumin: 4.2 g/dL (ref 3.5–5.2)
Alkaline Phosphatase: 37 U/L — ABNORMAL LOW (ref 39–117)
BILIRUBIN TOTAL: 0.6 mg/dL (ref 0.2–1.2)
BUN: 26 mg/dL — ABNORMAL HIGH (ref 6–23)
CALCIUM: 9.8 mg/dL (ref 8.4–10.5)
CO2: 26 meq/L (ref 19–32)
CREATININE: 0.97 mg/dL (ref 0.40–1.50)
Chloride: 106 mEq/L (ref 96–112)
GFR: 80.11 mL/min (ref >60.00)
Glucose, Bld: 111 mg/dL — ABNORMAL HIGH (ref 70–99)
Potassium: 4.5 mEq/L (ref 3.5–5.1)
Sodium: 140 mEq/L (ref 135–145)
Total Protein: 6.5 g/dL (ref 6.0–8.3)

## 2017-07-09 LAB — HEMOGLOBIN A1C: HEMOGLOBIN A1C: 6.2 % (ref 4.6–6.5)

## 2017-07-09 MED ORDER — DOXAZOSIN MESYLATE 8 MG PO TABS: 4.0000 mg | ORAL_TABLET | Freq: Every day | ORAL | Status: DC

## 2017-07-09 NOTE — Patient Instructions (Addendum)
Cut the doxazosin back to 4mg  a day.   If you are still lightheaded after a few days, then cut it out totally.   If you have trouble urinating, then go back to the previous effective dose and then cut the losartan in half.   Update me as needed.  Go to the lab on the way out.  We'll contact you with your lab report. We may need to taper your diabetes meds after I see your labs.  Schedule a physical for about 3 months from now.  Take care.  Glad to see you.

## 2017-07-09 NOTE — Progress Notes (Signed)
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  He hasn't had troubles but he wants to retire on his own terms.  D/w pt.    Started about 10 days ago.  He had upset stomach and dec in appetite.  Soft stools, yellowish stools- better over a few days.   Sugar up to 156 recently, that was higher than normal for him.  Lightheaded recently, didn't feel well in general.  He feels some better now, nearly back to baseline.  Some rhinorrhea.  No fevers.  Weight is down.  D/w pt- intentional weight loss with prev low carb diet.  He never had focal neuro changes.    Meds, vitals, and allergies reviewed.   ROS: Per HPI unless specifically indicated in ROS section   GEN: nad, alert and oriented HEENT: mucous membranes moist NECK: supple w/o LA CV: rrr. PULM: ctab, no inc wob ABD: soft, +bs EXT: no edema SKIN: well perfused.

## 2017-07-10 NOTE — Assessment & Plan Note (Signed)
Likely relatively overtreated given his recent GI symptoms and overall intentional weight loss.  Discussed with patient about options. Cut the doxazosin back to 4mg  a day.   If still lightheaded after a few days, then cut it out totally.   If trouble urinating, then go back to the previous effective dose and then cut the losartan in half.   Update me as needed.  See notes on labs.   We may need to taper his diabetes meds after I see his labs.  See avs.

## 2017-07-16 ENCOUNTER — Other Ambulatory Visit: Payer: Self-pay | Admitting: Family Medicine

## 2017-07-16 MED ORDER — LOSARTAN POTASSIUM 50 MG PO TABS: 25.0000 mg | ORAL_TABLET | Freq: Every day | ORAL | Status: DC

## 2017-07-16 MED ORDER — DOXAZOSIN MESYLATE 8 MG PO TABS: 8.0000 mg | ORAL_TABLET | Freq: Every day | ORAL | Status: DC

## 2017-07-22 DIAGNOSIS — H02831 Dermatochalasis of right upper eyelid: Secondary | ICD-10-CM | POA: Diagnosis not present

## 2017-07-22 DIAGNOSIS — Z7984 Long term (current) use of oral hypoglycemic drugs: Secondary | ICD-10-CM | POA: Diagnosis not present

## 2017-07-22 DIAGNOSIS — E119 Type 2 diabetes mellitus without complications: Secondary | ICD-10-CM | POA: Diagnosis not present

## 2017-07-22 DIAGNOSIS — H5203 Hypermetropia, bilateral: Secondary | ICD-10-CM | POA: Insufficient documentation

## 2017-07-22 DIAGNOSIS — H02834 Dermatochalasis of left upper eyelid: Secondary | ICD-10-CM | POA: Diagnosis not present

## 2017-07-22 DIAGNOSIS — H2513 Age-related nuclear cataract, bilateral: Secondary | ICD-10-CM | POA: Diagnosis not present

## 2017-09-20 ENCOUNTER — Ambulatory Visit (INDEPENDENT_AMBULATORY_CARE_PROVIDER_SITE_OTHER): Payer: Medicare Other | Admitting: Family Medicine

## 2017-09-20 ENCOUNTER — Encounter: Payer: Self-pay | Admitting: Family Medicine

## 2017-09-20 ENCOUNTER — Ambulatory Visit: Payer: Medicare Other

## 2017-09-20 ENCOUNTER — Other Ambulatory Visit: Payer: Self-pay | Admitting: Family Medicine

## 2017-09-20 VITALS — BP 126/74 | HR 47 | Temp 97.7°F | Ht 70.0 in | Wt 192.5 lb

## 2017-09-20 DIAGNOSIS — Z7189 Other specified counseling: Secondary | ICD-10-CM

## 2017-09-20 DIAGNOSIS — E119 Type 2 diabetes mellitus without complications: Secondary | ICD-10-CM

## 2017-09-20 DIAGNOSIS — R001 Bradycardia, unspecified: Secondary | ICD-10-CM | POA: Diagnosis not present

## 2017-09-20 DIAGNOSIS — R972 Elevated prostate specific antigen [PSA]: Secondary | ICD-10-CM

## 2017-09-20 DIAGNOSIS — I10 Essential (primary) hypertension: Secondary | ICD-10-CM

## 2017-09-20 DIAGNOSIS — E785 Hyperlipidemia, unspecified: Secondary | ICD-10-CM | POA: Diagnosis not present

## 2017-09-20 DIAGNOSIS — Z125 Encounter for screening for malignant neoplasm of prostate: Secondary | ICD-10-CM

## 2017-09-20 DIAGNOSIS — Z0000 Encounter for general adult medical examination without abnormal findings: Secondary | ICD-10-CM

## 2017-09-20 DIAGNOSIS — N400 Enlarged prostate without lower urinary tract symptoms: Secondary | ICD-10-CM

## 2017-09-20 LAB — LIPID PANEL
CHOL/HDL RATIO: 3
Cholesterol: 121 mg/dL (ref 0–200)
HDL: 47 mg/dL (ref >39.00)
LDL Cholesterol: 59 mg/dL (ref 0–99)
NONHDL: 73.93
Triglycerides: 74 mg/dL (ref 0.0–149.0)
VLDL: 14.8 mg/dL (ref 0.0–40.0)

## 2017-09-20 LAB — HEMOGLOBIN A1C: Hgb A1c MFr Bld: 6 % (ref 4.6–6.5)

## 2017-09-20 LAB — PSA, MEDICARE: PSA: 4.04 ng/ml — ABNORMAL HIGH (ref 0.10–4.00)

## 2017-09-20 MED ORDER — LOSARTAN POTASSIUM 50 MG PO TABS
50.0000 mg | ORAL_TABLET | Freq: Every day | ORAL | 3 refills | Status: DC
Start: 2017-09-20 — End: 2018-09-18

## 2017-09-20 MED ORDER — PIOGLITAZONE HCL 30 MG PO TABS: 30.0000 mg | ORAL_TABLET | Freq: Every day | ORAL | 3 refills | Status: DC

## 2017-09-20 MED ORDER — SIMVASTATIN 40 MG PO TABS: 40.0000 mg | ORAL_TABLET | Freq: Every day | ORAL | 3 refills | Status: DC

## 2017-09-20 MED ORDER — LOSARTAN POTASSIUM 50 MG PO TABS: 50.0000 mg | ORAL_TABLET | Freq: Every day | ORAL | Status: DC

## 2017-09-20 MED ORDER — METFORMIN HCL 500 MG PO TABS
ORAL_TABLET | ORAL | 3 refills | Status: DC
Start: 2017-09-20 — End: 2018-10-02

## 2017-09-20 MED ORDER — DOXAZOSIN MESYLATE 8 MG PO TABS: 8.0000 mg | ORAL_TABLET | Freq: Every day | ORAL | 3 refills | Status: DC

## 2017-09-20 NOTE — Progress Notes (Signed)
PSA elevation prev.   Repeat pending.  BPH sx better on 8mg  doxazosin.  He had slower stream on 4 mg dosing.  No adverse effect on 8 mg dosing.  Diabetes:  Using medications without difficulties: yes Hypoglycemic episodes:no Hyperglycemic episodes:no Feet problems:no Blood Sugars averaging: usually ~ 130 or lower, usually ~100-120s now eye exam within last year:yes Labs pending.   Elevated Cholesterol: Using medications without problems: yes Muscle aches: no  Diet compliance: "I'm doing better." Exercise: some, encouraged.   Labs pending   Hypertension:    Using medication without problems or lightheadedness: yes Chest pain with exertion:no Edema:no Short of breath:no Average home BPs: ~120/70s  Recheck pulse 48.  Regular.  His father has h/o bradycardia.  Patient's pulse is usually in 50-60s.  No syncope.  He has ability to elevate heart rate with exercise, up to 80-90s with walking around, 120s with exercise.    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  He misses it but he wanted to go out on his own terms.    Hearing screening failed. He was prev told a hearing aid wouldn't help.  D/w pt.   Flu and shingles d/w pt.   He can get flu shot done at pharmacy.  Advance directive- wife designated if patient were incapacitated.  Colonoscopy 2017  His father is [id number], [place] memory loss[place]    PMH and SH reviewed  Meds, vitals, and allergies reviewed.   ROS: Per HPI unless specifically indicated in ROS section   GEN: nad, alert and oriented HEENT: mucous membranes moist NECK: supple w/o LA CV: rrr. PULM: ctab, no inc wob ABD: soft, +bs EXT: no edema SKIN: no acute rash  Diabetic foot exam: Normal inspection except for dry skin.  No skin breakdown No calluses  Normal DP pulses Normal sensation to light touch and monofilament Nails thickened.

## 2017-09-20 NOTE — Patient Instructions (Signed)
Jacob Ponce , Thank you for taking time to come for your Medicare Wellness Visit. I appreciate your ongoing commitment to your health goals. Please review the following plan we discussed and let me know if I can assist you in the future.   These are the goals we discussed: Goals    . Patient Stated     Jacob GuadeloupeStartinh 09/20/2017, I will continue to take medications as prescribed.        This is a list of the screening recommended for you and due dates:  Health Maintenance  Topic Date Due  . Flu Shot  09/12/2048*  . Hemoglobin A1C  01/08/2018  . Eye exam for diabetics  08/06/2018  . Complete foot exam   09/21/2018  . Colon Cancer Screening  06/26/2020  . Tetanus Vaccine  12/05/2026  . Pneumonia vaccines  Completed  *Topic was postponed. The date shown is not the original due date.   Preventive Care for Adults  A healthy lifestyle and preventive care can promote health and wellness. Preventive health guidelines for adults include the following key practices.  . A routine yearly physical is a good way to check with your health care provider about your health and preventive screening. It is a chance to share any concerns and updates on your health and to receive a thorough exam.  . Visit your dentist for a routine exam and preventive care every 6 months. Brush your teeth twice a day and floss once a day. Good oral hygiene prevents tooth decay and gum disease.  . The frequency of eye exams is based on your age, health, family medical history, use  of contact lenses, and other factors. Follow your health care provider's recommendations for frequency of eye exams.  . Eat a healthy diet. Foods like vegetables, fruits, whole grains, low-fat dairy products, and lean protein foods contain the nutrients you need without too many calories. Decrease your intake of foods high in solid fats, added sugars, and salt. Eat the right amount of calories for you. Get information about a proper diet from your health  care provider, if necessary.  . Regular physical exercise is one of the most important things you can do for your health. Most adults should get at least 150 minutes of moderate-intensity exercise (any activity that increases your heart rate and causes you to sweat) each week. In addition, most adults need muscle-strengthening exercises on 2 or more days a week.  Silver Sneakers may be a benefit available to you. To determine eligibility, you may visit the website: www.silversneakers.com or contact program at [phone] Mon-Fri between 8AM-8PM.   . Maintain a healthy weight. The body mass index (BMI) is a screening tool to identify possible weight problems. It provides an estimate of body fat based on height and weight. Your health care provider can find your BMI and can help you achieve or maintain a healthy weight.   For adults 20 years and older: ? A BMI below 18.5 is considered underweight. ? A BMI of 18.5 to 24.9 is normal. ? A BMI of 25 to 29.9 is considered overweight. ? A BMI of 30 and above is considered obese.   . Maintain normal blood lipids and cholesterol levels by exercising and minimizing your intake of saturated fat. Eat a balanced diet with plenty of fruit and vegetables. Blood tests for lipids and cholesterol should begin at age 75 and be repeated every 5 years. If your lipid or cholesterol levels are high, you are over 50,  or you are at high risk for heart disease, you may need your cholesterol levels checked more frequently. Ongoing high lipid and cholesterol levels should be treated with medicines if diet and exercise are not working.  . If you smoke, find out from your health care provider how to quit. If you do not use tobacco, please do not start.  . If you choose to drink alcohol, please do not consume more than 2 drinks per day. One drink is considered to be 12 ounces (355 mL) of beer, 5 ounces (148 mL) of wine, or 1.5 ounces (44 mL) of liquor.  . If you are 55-61  years old, ask your health care provider if you should take aspirin to prevent strokes.  . Use sunscreen. Apply sunscreen liberally and repeatedly throughout the day. You should seek shade when your shadow is shorter than you. Protect yourself by wearing long sleeves, pants, a wide-brimmed hat, and sunglasses year round, whenever you are outdoors.  . Once a month, do a whole body skin exam, using a mirror to look at the skin on your back. Tell your health care provider of new moles, moles that have irregular borders, moles that are larger than a pencil eraser, or moles that have changed in shape or color.

## 2017-09-20 NOTE — Patient Instructions (Addendum)
We'll contact you with your lab report. Don't change your meds for now.   Recheck in about 6 months.  We can labs at the visit next time.  You don't have to fast coming in for the visit.  Take care.  Glad to see you.  Update me as needed.

## 2017-09-20 NOTE — Progress Notes (Signed)
Subjective:   Jacob Ponce is a 77 y.o. male who presents for Medicare Annual/Subsequent preventive examination.  Review of Systems:  N/A Cardiac Risk Factors include: advanced age (>2655men, 70>65 women);male gender;dyslipidemia;diabetes mellitus;hypertension     Objective:    Vitals: BP 126/74 (BP Location: Right Arm, Patient Position: Sitting, Cuff Size: Normal)   Pulse (!) 47   Temp 97.7 F (36.5 C) (Oral)   Ht 5\' 10"  (1.778 m) Comment: no shoes  Wt 192 lb 8 oz (87.3 kg)   SpO2 98%   BMI 27.62 kg/m   Body mass index is 27.62 kg/m.  Advanced Directives 09/20/2017 09/12/2016  Does Patient Have a Medical Advance Directive? Yes Yes  Type of Estate agentAdvance Directive Healthcare Power of [place];Living will Healthcare Power of [place];Living will  Copy of Healthcare Power of Attorney in Chart? No - copy requested No - copy requested    Tobacco Social History   Tobacco Use  Smoking Status Former Smoker  . Types: Cigarettes  . Last attempt to quit: 02/06/1984  . Years since quitting: 33.6  Smokeless Tobacco Never Used  Tobacco Comment   Smoked from 1966 to 1986     Counseling given: No Comment: Smoked from 1966 to 1986   Clinical Intake:  Pre-visit preparation completed: Yes  Pain : No/denies pain Pain Score: 0-No pain     Nutritional Status: BMI 25 -29 Overweight Nutritional Risks: None Diabetes: Yes CBG done?: No Did pt. bring in CBG monitor from home?: No  How often do you need to have someone help you when you read instructions, pamphlets, or other written materials from your doctor or pharmacy?: 1 - Never What is the last grade level you completed in school?: Masters degree  Interpreter Needed?: No  Comments: pt lives with spouse Information entered by :: LPinson, LPN  Past Medical History:  Diagnosis Date  . BPH (benign prostatic hyperplasia)   . Diabetes mellitus    Type II, controlled  . Fracture 2011   Right proximal humerus, resolved w/o deficit  .  History of cardiac cath 1999   and Tilt Table Test, both negative  . Hyperlipidemia   . Hypertension   . Impotence of organic origin   . Special screening for malignant neoplasms of other sites    Past Surgical History:  Procedure Laterality Date  . APPENDECTOMY    . TIBIA FRACTURE SURGERY     Left, resolved without deficit  . TONSILLECTOMY     Family History  Problem Relation Age of Onset  . Cancer Father        colon  . Seizures Father        after head injury  . Colon cancer Father   . Colon cancer Mother   . Arthritis Brother   . Prostate cancer Neg Hx    Social History   Socioeconomic History  . Marital status: Married    Spouse name: Not on file  . Number of children: 4  . Years of education: Not on file  . Highest education level: Not on file  Occupational History  . Occupation: Art gallery managerngineer at [employer]    Employer: [employer]  . Occupation: [employer]  Social Needs  . Financial resource strain: Not on file  . Food insecurity:    Worry: Not on file    Inability: Not on file  . Transportation needs:    Medical: Not on file    Non-medical: Not on file  Tobacco Use  . Smoking  status: Former Smoker    Types: Cigarettes    Last attempt to quit: 02/06/1984    Years since quitting: 33.6  . Smokeless tobacco: Never Used  . Tobacco comment: Smoked from 1966 to 1986  Substance and Sexual Activity  . Alcohol use: No    Alcohol/week: 0.0 standard drinks  . Drug use: No  . Sexual activity: Never  Lifestyle  . Physical activity:    Days per week: Not on file    Minutes per session: Not on file  . Stress: Not on file  Relationships  . Social connections:    Talks on phone: Not on file    Gets together: Not on file    Attends religious service: Not on file    Active member of club or organization: Not on file    Attends meetings of clubs or organizations: Not on file    Relationship status: Not on file  Other Topics Concern  . Not on file  Social History Narrative    Undergrad and Masters at [employer].   Second marriage since 1987   4 children, 3 step-children   Flies small aircraft   Retired 2016    Outpatient Encounter Medications as of 09/20/2017  Medication Sig  . aspirin 81 MG tablet Take 81 mg by mouth daily.    Marland Kitchen doxazosin (CARDURA) 8 MG tablet Take 1 tablet (8 mg total) by mouth at bedtime.  Marland Kitchen glucose blood (ONE TOUCH ULTRA TEST) test strip Use to check blood sugar daily.  Dx: E11.9  . Lancets (ONETOUCH ULTRASOFT) lancets Use to check blood sugar daily.  Dx: E11.9  . loratadine (CLARITIN) 10 MG tablet Take 10 mg by mouth daily as needed for allergies.  Marland Kitchen losartan (COZAAR) 50 MG tablet Take 0.5 tablets (25 mg total) by mouth daily. (Patient taking differently: Take 50 mg by mouth daily. )  . metFORMIN (GLUCOPHAGE) 500 MG tablet TAKE 1 TABLET TWICE DAILY WITH A MEAL.  . pioglitazone (ACTOS) 30 MG tablet Take 1 tablet (30 mg total) by mouth daily.  . simvastatin (ZOCOR) 40 MG tablet TAKE ONE TABLET AT BEDTIME.   No facility-administered encounter medications on file as of 09/20/2017.     Activities of Daily Living In your present state of health, do you have any difficulty performing the following activities: 09/20/2017  Hearing? Y  Comment bilateral hearing loss  Vision? N  Difficulty concentrating or making decisions? N  Walking or climbing stairs? N  Dressing or bathing? N  Doing errands, shopping? N  Preparing Food and eating ? N  Using the Toilet? N  In the past six months, have you accidently leaked urine? N  Do you have problems with loss of bowel control? N  Managing your Medications? N  Managing your Finances? N  Housekeeping or managing your Housekeeping? N  Some recent data might be hidden    Patient Care Team: Joaquim Nam, MD as PCP - General   Assessment:   This is a routine wellness examination for Alben.   Hearing Screening   125Hz  250Hz  500Hz  1000Hz  2000Hz  3000Hz  4000Hz  6000Hz  8000Hz   Right ear:   40 0 0  0     Left ear:   40 40 40  0    Vision Screening Comments: Vision exam in July 2019     Exercise Activities and Dietary recommendations Current Exercise Habits: The patient does not participate in regular exercise at present(works at dairy processing plant occassionally), Exercise limited by: None identified  Goals    . Patient Stated     Sindy GuadeloupeStartinh 09/20/2017, I will continue to take medications as prescribed.        Fall Risk Fall Risk  09/20/2017 09/12/2016 03/29/2014 08/10/2013  Falls in the past year? No No Yes No  Number falls in past yr: - - 1 -   Depression Screen PHQ 2/9 Scores 09/20/2017 09/12/2016 03/29/2014 08/10/2013  PHQ - 2 Score 0 0 0 0  PHQ- 9 Score 0 - - -    Cognitive Function MMSE - Mini Mental State Exam 09/20/2017 09/12/2016  Orientation to time 5 5  Orientation to Place 5 5  Registration 3 3  Attention/ Calculation 0 0  Recall 3 3  Language- name 2 objects 0 0  Language- repeat 1 1  Language- follow 3 step command 3 3  Language- read & follow direction 0 0  Write a sentence 0 0  Copy design 0 0  Total score 20 20     PLEASE NOTE: A Mini-Cog screen was completed. Maximum score is 20. A value of 0 denotes this part of Folstein MMSE was not completed or the patient failed this part of the Mini-Cog screening.   Mini-Cog Screening Orientation to Time - Max 5 pts Orientation to Place - Max 5 pts Registration - Max 3 pts Recall - Max 3 pts Language Repeat - Max 1 pts Language Follow 3 Step Command - Max 3 pts     Immunization History  Administered Date(s) Administered  . Pneumococcal Conjugate-13 08/31/2014  . Pneumococcal Polysaccharide-23 02/06/2004, 09/18/2011  . Td 02/06/2004  . Tdap 12/04/2016    Screening Tests Health Maintenance  Topic Date Due  . INFLUENZA VACCINE  09/12/2048 (Originally 09/05/2017)  . HEMOGLOBIN A1C  01/08/2018  . OPHTHALMOLOGY EXAM  08/06/2018  . FOOT EXAM  09/21/2018  . COLONOSCOPY  06/26/2020  . TETANUS/TDAP  12/05/2026  .  PNA vac Low Risk Adult  Completed      Plan:     I have personally reviewed, addressed, and noted the following in the patient's chart:  A. Medical and social history B. Use of alcohol, tobacco or illicit drugs  C. Current medications and supplements D. Functional ability and status E.  Nutritional status F.  Physical activity G. Advance directives H. List of other physicians I.  Hospitalizations, surgeries, and ER visits in previous 12 months J.  Vitals K. Screenings to include hearing, vision, cognitive, depression L. Referrals and appointments - none  In addition, I have reviewed and discussed with patient certain preventive protocols, quality metrics, and best practice recommendations. A written personalized care plan for preventive services as well as general preventive health recommendations were provided to patient.  See attached scanned questionnaire for additional information.   Signed,   Randa EvensLesia Monseratt Ledin, MHA, BS, LPN Health Coach

## 2017-09-20 NOTE — Progress Notes (Signed)
PCP notes:   Health maintenance:  Foot exam - PCP please address at next appt A1C - completed  Abnormal screenings:   Hearing - failed  Hearing Screening   125Hz  250Hz  500Hz  1000Hz  2000Hz  3000Hz  4000Hz  6000Hz  8000Hz   Right ear:   40 0 0  0    Left ear:   40 40 40  0     Patient concerns:   None  Nurse concerns:  None  Next PCP appt:   09/20/17 @ 0945  I reviewed health advisor's note, was available for consultation on the day of service listed in this note, and agree with documentation and plan. Crawford GivensGraham Duncan, MD.

## 2017-09-22 ENCOUNTER — Other Ambulatory Visit: Payer: Self-pay | Admitting: Family Medicine

## 2017-09-22 DIAGNOSIS — R001 Bradycardia, unspecified: Secondary | ICD-10-CM | POA: Insufficient documentation

## 2017-09-22 DIAGNOSIS — E119 Type 2 diabetes mellitus without complications: Secondary | ICD-10-CM

## 2017-09-22 NOTE — Assessment & Plan Note (Signed)
See notes on follow-up labs. 

## 2017-09-22 NOTE — Assessment & Plan Note (Addendum)
Continue work on diet and exercise.  No change in meds.  See notes on labs. >25 minutes spent in face to face time with patient, >50% spent in counselling or coordination of care.

## 2017-09-22 NOTE — Assessment & Plan Note (Addendum)
Reasonable to continue as is with 8 milligrams of doxazosin.  Update me as needed.  No adverse effect on medication.  See notes on repeat PSA.

## 2017-09-22 NOTE — Assessment & Plan Note (Signed)
No change in meds at this point.  Continue work on diet and exercise.  Continue simvastatin.  No adverse effect on medication.

## 2017-09-22 NOTE — Assessment & Plan Note (Signed)
Sounds to be sinus bradycardia.  He can clearly elevate his heart rate with exertion.  No chest pain.  No syncope.  Would continue to observe for now.  He is asymptomatic.

## 2017-09-22 NOTE — Assessment & Plan Note (Signed)
Reasonable control.  No change in meds.  He agrees.  Not lightheaded.

## 2017-09-22 NOTE — Assessment & Plan Note (Signed)
Hearing screening failed. He was prev told a hearing aid wouldn't help.  D/w pt.   Flu and shingles d/w pt.   He can get flu shot done at pharmacy.  Advance directive- wife designated if patient were incapacitated.  Colonoscopy 2017

## 2017-09-22 NOTE — Assessment & Plan Note (Signed)
Advance directive- wife designated if patient were incapacitated.  

## 2017-11-07 ENCOUNTER — Emergency Department (HOSPITAL_BASED_OUTPATIENT_CLINIC_OR_DEPARTMENT_OTHER): Payer: Medicare Other

## 2017-11-07 ENCOUNTER — Encounter (HOSPITAL_BASED_OUTPATIENT_CLINIC_OR_DEPARTMENT_OTHER): Payer: Self-pay | Admitting: *Deleted

## 2017-11-07 ENCOUNTER — Other Ambulatory Visit: Payer: Self-pay

## 2017-11-07 ENCOUNTER — Emergency Department (HOSPITAL_BASED_OUTPATIENT_CLINIC_OR_DEPARTMENT_OTHER)
Admission: EM | Admit: 2017-11-07 | Discharge: 2017-11-07 | Disposition: A | Discharge status: Home / Self Care | Payer: Medicare Other | Attending: Emergency Medicine | Admitting: Emergency Medicine

## 2017-11-07 DIAGNOSIS — Z7984 Long term (current) use of oral hypoglycemic drugs: Secondary | ICD-10-CM | POA: Insufficient documentation

## 2017-11-07 DIAGNOSIS — Z7982 Long term (current) use of aspirin: Secondary | ICD-10-CM | POA: Insufficient documentation

## 2017-11-07 DIAGNOSIS — Y999 Unspecified external cause status: Secondary | ICD-10-CM | POA: Insufficient documentation

## 2017-11-07 DIAGNOSIS — M79605 Pain in left leg: Secondary | ICD-10-CM | POA: Diagnosis not present

## 2017-11-07 DIAGNOSIS — E119 Type 2 diabetes mellitus without complications: Secondary | ICD-10-CM | POA: Diagnosis not present

## 2017-11-07 DIAGNOSIS — Z79899 Other long term (current) drug therapy: Secondary | ICD-10-CM | POA: Diagnosis not present

## 2017-11-07 DIAGNOSIS — S8992XA Unspecified injury of left lower leg, initial encounter: Secondary | ICD-10-CM | POA: Diagnosis not present

## 2017-11-07 DIAGNOSIS — Z87891 Personal history of nicotine dependence: Secondary | ICD-10-CM | POA: Insufficient documentation

## 2017-11-07 DIAGNOSIS — I10 Essential (primary) hypertension: Secondary | ICD-10-CM | POA: Insufficient documentation

## 2017-11-07 DIAGNOSIS — W2203XA Walked into furniture, initial encounter: Secondary | ICD-10-CM | POA: Insufficient documentation

## 2017-11-07 DIAGNOSIS — Y9302 Activity, running: Secondary | ICD-10-CM | POA: Diagnosis not present

## 2017-11-07 DIAGNOSIS — S8392XA Sprain of unspecified site of left knee, initial encounter: Secondary | ICD-10-CM | POA: Diagnosis not present

## 2017-11-07 DIAGNOSIS — Y92009 Unspecified place in unspecified non-institutional (private) residence as the place of occurrence of the external cause: Secondary | ICD-10-CM | POA: Insufficient documentation

## 2017-11-07 NOTE — ED Provider Notes (Signed)
[facility] EMERGENCY DEPARTMENT Provider Note   CSN: [id number] Arrival date & time: 11/07/17  1126     History   Chief Complaint Chief Complaint  Patient presents with  . Fall    HPI Jacob Ponce is a 77 y.o. male possible history of diabetes, hyper lipidemia, hypertension who presents for evaluation of left lower leg pain that began today after hitting it against a table.  He reports that he was running to answer the doorbell that was reading when he ran into the table at the anterior aspect of his left leg.  He reports when he did, he heard a "crunching sound."  And caused him to fall to the ground.  He reports he did not hit his head or lose any consciousness.  Patient reports that since then he has had pain to the area.  He reports he has been able to ambulate bear weight with the assistance of a cane.  Normally he walks without the assistance of a cane.  Patient reports he does not take any medication for pain.  Patient denies any numbness/weakness, pain, hip pain.  The history is provided by the patient.    Past Medical History:  Diagnosis Date  . BPH (benign prostatic hyperplasia)   . Diabetes mellitus    Type II, controlled  . Fracture 2011   Right proximal humerus, resolved w/o deficit  . History of cardiac cath 1999   and Tilt Table Test, both negative  . Hyperlipidemia   . Hypertension   . Impotence of organic origin   . Special screening for malignant neoplasms of other sites     Patient Active Problem List   Diagnosis Date Noted  . Bradycardia 09/22/2017  . Healthcare maintenance 09/13/2016  . 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 PSA elevation 03/23/2016  . Advance care planning 03/30/2014  . Medicare annual wellness visit, subsequent 08/20/2010  . HLD (hyperlipidemia) 09/14/2009  . Essential hypertension 09/14/2009  . ORGANIC IMPOTENCE 09/14/2009  . BPH (benign prostatic hyperplasia) 09/14/2009  . Diabetes mellitus without complication (HCC)  09/14/2009    Past Surgical History:  Procedure Laterality Date  . APPENDECTOMY    . TIBIA FRACTURE SURGERY     Left, resolved without deficit  . TONSILLECTOMY          Home Medications    Prior to Admission medications   Medication Sig Start Date End Date Taking? Authorizing Provider  aspirin 81 MG tablet Take 81 mg by mouth daily.      [provider]  doxazosin (CARDURA) 8 MG tablet Take 1 tablet (8 mg total) by mouth at bedtime. 09/20/17   Joaquim Nam, MD  glucose blood (ONE TOUCH ULTRA TEST) test strip Use to check blood sugar daily.  Dx: E11.9 02/07/17   Joaquim Nam, MD  Lancets [employer] [facility] ULTRASOFT) lancets Use to check blood sugar daily.  Dx: E11.9 02/07/17   Joaquim Nam, MD  loratadine (CLARITIN) 10 MG tablet Take 10 mg by mouth daily as needed for allergies.    [provider]  losartan (COZAAR) 50 MG tablet Take 1 tablet (50 mg total) by mouth daily. 09/20/17   Joaquim Nam, MD  metFORMIN (GLUCOPHAGE) 500 MG tablet TAKE 1 TABLET TWICE DAILY WITH A MEAL. 09/20/17   Joaquim Nam, MD  pioglitazone (ACTOS) 30 MG tablet Take 1 tablet (30 mg total) by mouth daily. 09/20/17   Joaquim Nam, MD  simvastatin (ZOCOR) 40 MG tablet Take 1  tablet (40 mg total) by mouth at bedtime. 09/20/17   Joaquim Nam, MD    Family History Family History  Problem Relation Age of Onset  . Cancer Father        colon  . Seizures Father        after head injury  . Colon cancer Father   . Colon cancer Mother   . Arthritis Brother   . Prostate cancer Neg Hx     Social History Social History   Tobacco Use  . Smoking status: Former Smoker    Types: Cigarettes    Last attempt to quit: 02/06/1984    Years since quitting: 33.7  . Smokeless tobacco: Never Used  . Tobacco comment: Smoked from 1966 to 1986  Substance Use Topics  . Alcohol use: No    Alcohol/week: 0.0 standard drinks  . Drug use: No     Allergies   Zyrtec [cetirizine]   Review of  Systems Review of Systems  Musculoskeletal:       LLE pain  Neurological: Negative for weakness and numbness.  All other systems reviewed and are negative.    Physical Exam Updated Vital Signs BP 137/64   Pulse (!) 58   Temp 97.9 F (36.6 C) (Oral)   Resp 20   Ht 5' 10.5" (1.791 m)   Wt 90.7 kg   SpO2 99%   BMI 28.29 kg/m   Physical Exam  Constitutional: He appears well-developed and well-nourished.  HENT:  Head: Normocephalic and atraumatic.  Eyes: Conjunctivae and EOM are normal. Right eye exhibits no discharge. Left eye exhibits no discharge. No scleral icterus.  Cardiovascular:  Pulses:      Dorsalis pedis pulses are 2+ on the right side, and 2+ on the left side.  Pulmonary/Chest: Effort normal.  Musculoskeletal:       Legs: No tenderness palpation noted to distal tib-fib, ankle, hip.  Slight tenderness palpation noted to the inferior aspect of the left knee with some overlying soft tissue swelling.  No overlying ecchymosis, erythema.  No deformity or crepitus noted.  Limited flexion/extension knee secondary pain.  Slight laxity noted on anterior drawer test.  Negative posterior drawer test.  No instability noted on varus or valgus stress.  Internal and external rotation intact without subjective reports of pain.  Full range of motion of right lower extremity with any difficulty.  Neurological: He is alert.  Sensation intact along major nerve distributions of LLE  Skin: Skin is warm and dry.  Abrasion noted to the anterior aspect of the left tib-fib.  Psychiatric: He has a normal mood and affect. His speech is normal and behavior is normal.  Nursing note and vitals reviewed.    ED Treatments / Results  Labs (all labs ordered are listed, but only abnormal results are displayed) Labs Reviewed - No data to display  EKG None  Radiology Dg Tibia/fibula Left  Result Date: 11/07/2017 CLINICAL DATA:  Left leg pain after injury. EXAM: LEFT TIBIA AND FIBULA - 2 VIEW  COMPARISON:  None. FINDINGS: Old healed distal left fibular fracture is noted. No acute fracture or dislocation is noted. No soft tissue abnormality is noted. IMPRESSION: No acute abnormality seen in the left tibia or fibula. Electronically Signed   By: Lupita Raider, M.D.   On: 11/07/2017 12:32    Procedures Procedures (including critical care time)  Medications Ordered in ED Medications - No data to display   Initial Impression / Assessment and Plan / ED Course  I have reviewed the triage vital signs and the nursing notes.  Pertinent labs & imaging results that were available during my care of the patient were reviewed by me and considered in my medical decision making (see chart for details).     77 year old male who presents for evaluation of left leg pain after hitting it on a table and causing a fall.  Reports that he did not hit his head or lose consciousness.  Reports he has been able to ambulate bear weight with the assistance of a cane after injury.  No numbness/weakness. Patient is afebrile, non-toxic appearing, sitting comfortably on examination table. Vital signs reviewed and stable. Patient is neurovascularly intact.  On exam, patient with tenderness palpation of the anterior aspect of the left tib-fib and the inferior aspect of the left knee.  Limited flexion/extension of the knee secondary to pain.  He has a small abrasion noted to the anterior aspect of the left tib-fib.  Consider fracture versus dislocation versus musculoskeletal injury versus sprain.  Initial x-rays ordered at triage.  Reviewed.  Negative for any acute bony abnormality.  He does have an old healed distal fibula fracture.  Given concerns for sprain, will put him in a knee immobilizer.  Patient reports he has an orthopedic doctor at his primary care office who we can follow-up with.  Offered patient analgesics but he declined. Patient had ample opportunity for questions and discussion. All patient's questions  were answered with full understanding. Strict return precautions discussed. Patient expresses understanding and agreement to plan.   Final Clinical Impressions(s) / ED Diagnoses   Final diagnoses:  Pain of left lower extremity  Sprain of left knee, unspecified ligament, initial encounter    ED Discharge Orders    None       Rosana Hoes 11/07/17 1854    Tegeler, Canary Brim, MD 11/09/17 1506

## 2017-11-07 NOTE — ED Notes (Signed)
Pt met at door with d/c instructions. Vitals unable to be obtained. Wife returned to room for signature.  

## 2017-11-07 NOTE — ED Notes (Signed)
Pt. Demonstrated crutch walking with no difficulty

## 2017-11-07 NOTE — Discharge Instructions (Signed)
You can take Tylenol or Ibuprofen as directed for pain. You can alternate Tylenol and Ibuprofen every 4 hours. If you take Tylenol at 1pm, then you can take Ibuprofen at 5pm. Then you can take Tylenol again at 9pm.   Follow the RICE (Rest, Ice, Compression, Elevation) protocol as directed.   Wear the knee immobilizer for support and stabilization.  Use crutches to help with the port and stabilization.  As we discussed, follow-up with your referred orthopedic doctor for further evaluation.  Return the emergency department any worsening pain, redness or swelling of the knee, numbness/weakness of the leg or any other worsening or concerning symptoms.

## 2017-11-07 NOTE — ED Triage Notes (Signed)
Was in a hurry this am and ran into a wooden table. He heard a crunch sound to his left lower leg and fell to the ground. He is walking with a cane. Abrasion to his leg below his knee.

## 2017-12-11 DIAGNOSIS — K649 Unspecified hemorrhoids: Secondary | ICD-10-CM | POA: Diagnosis not present

## 2018-01-20 ENCOUNTER — Encounter: Payer: Self-pay | Admitting: Family Medicine

## 2018-01-20 ENCOUNTER — Ambulatory Visit (INDEPENDENT_AMBULATORY_CARE_PROVIDER_SITE_OTHER): Payer: Medicare Other | Admitting: Family Medicine

## 2018-01-20 VITALS — BP 122/70 | HR 82 | Temp 98.1°F | Ht 70.0 in | Wt 200.8 lb

## 2018-01-20 DIAGNOSIS — H109 Unspecified conjunctivitis: Secondary | ICD-10-CM | POA: Insufficient documentation

## 2018-01-20 DIAGNOSIS — H1033 Unspecified acute conjunctivitis, bilateral: Secondary | ICD-10-CM

## 2018-01-20 MED ORDER — POLYMYXIN B-TRIMETHOPRIM 10000-0.1 UNIT/ML-% OP SOLN: 1.0000 [drp] | OPHTHALMIC | 0 refills | Status: DC

## 2018-01-20 NOTE — Assessment & Plan Note (Signed)
Acute conjunctivitis-tx for bacterial source in light of severity  polytrim px  Disc use of cool compresses  Handout given Disc hygiene in detail (launder sheets/towels)  Try not to touch eyes Update if not starting to improve in a week or if worsening  -esp vision loss/eye pain, fever or facial swelling

## 2018-01-20 NOTE — Progress Notes (Signed)
Subjective:    Patient ID: Jacob Ponce, male    DOB: Oct 17, 1940, 77 y.o.   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 Conjunctivitis 01/20/2018  . Bradycardia 09/22/2017  . Healthcare maintenance 09/13/2016  . 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 PSA elevation 03/23/2016  . Advance care planning 03/30/2014  . Medicare annual wellness visit, subsequent 08/20/2010  . HLD (hyperlipidemia) 09/14/2009  . Essential hypertension 09/14/2009  . ORGANIC IMPOTENCE 09/14/2009  . BPH (benign prostatic hyperplasia) 09/14/2009  . Diabetes mellitus without complication (HCC) 09/14/2009   Past Medical History:  Diagnosis Date  . BPH (benign prostatic hyperplasia)   . Diabetes mellitus    Type II, controlled  . Fracture 2011   Right proximal humerus, resolved w/o deficit  . History of cardiac cath 1999   and Tilt Table Test, both negative  . Hyperlipidemia   . Hypertension   . Impotence of organic origin   . Special screening for malignant neoplasms of other sites    Past Surgical History:  Procedure Laterality Date  . APPENDECTOMY    . TIBIA FRACTURE SURGERY     Left, resolved without deficit  . TONSILLECTOMY     Social History   Tobacco Use  . Smoking status: Former Smoker    Types: Cigarettes    Last attempt to quit: 02/06/1984    Years since quitting: 33.9  . Smokeless tobacco: Never Used  . Tobacco comment: Smoked from 1966 to 1986  Substance Use Topics    . Alcohol use: No    Alcohol/week: 0.0 standard drinks  . Drug use: No   Family History  Problem Relation Age of Onset  . Cancer Father        colon  . Seizures Father        after head injury  . Colon cancer Father   . Colon cancer Mother   . Arthritis Brother   . Prostate cancer Neg Hx    Allergies  Allergen Reactions  . Zyrtec [Cetirizine] Other (See Comments)    Urinary stream changes   Current Outpatient Medications on File Prior to Visit  Medication Sig Dispense Refill  . aspirin 81 MG tablet Take 81 mg by mouth daily.      Marland Kitchen doxazosin (CARDURA) 8 MG tablet Take 1 tablet (8 mg total) by mouth at bedtime. 90 tablet 3  . glucose blood (ONE TOUCH ULTRA TEST) test strip Use to check blood sugar daily.  Dx: E11.9 100 each 3  . Lancets (ONETOUCH ULTRASOFT) lancets Use to check blood sugar daily.  Dx: E11.9 100 each 3  . loratadine (CLARITIN) 10 MG tablet Take 10 mg by mouth daily as needed for allergies.    Marland Kitchen losartan (COZAAR) 50 MG tablet Take 1 tablet (50 mg total) by mouth daily. 90 tablet 3  .  metFORMIN (GLUCOPHAGE) 500 MG tablet TAKE 1 TABLET TWICE DAILY WITH A MEAL. 180 tablet 3  . pioglitazone (ACTOS) 30 MG tablet Take 1 tablet (30 mg total) by mouth daily. 90 tablet 3  . simvastatin (ZOCOR) 40 MG tablet Take 1 tablet (40 mg total) by mouth at bedtime. 90 tablet 3   No current facility-administered medications on file prior to visit.     Review of Systems  Constitutional: Negative for activity change, appetite change, fatigue, fever and unexpected weight change.  HENT: Negative for congestion, rhinorrhea, sore throat and trouble swallowing.   Eyes: Positive for discharge, redness, itching and visual disturbance. Negative for photophobia and pain.  Respiratory: Negative for cough, chest tightness, shortness of breath and wheezing.   Cardiovascular: Negative for chest pain and palpitations.  Gastrointestinal: Negative for abdominal pain, blood in stool, constipation,  diarrhea and nausea.  Endocrine: Negative for cold intolerance, heat intolerance, polydipsia and polyuria.  Genitourinary: Negative for difficulty urinating, dysuria, frequency and urgency.  Musculoskeletal: Negative for arthralgias, joint swelling and myalgias.  Skin: Negative for pallor and rash.  Neurological: Negative for dizziness, tremors, weakness, numbness and headaches.  Hematological: Negative for adenopathy. Does not bruise/bleed easily.  Psychiatric/Behavioral: Negative for decreased concentration and dysphoric mood. The patient is not nervous/anxious.        Objective:   Physical Exam Constitutional:      General: He is not in acute distress.    Appearance: Normal appearance. He is not ill-appearing.  HENT:     Head: Normocephalic and atraumatic.     Right Ear: Tympanic membrane and ear canal normal.     Left Ear: Tympanic membrane and ear canal normal.     Nose: Nose normal.     Mouth/Throat:     Mouth: Mucous membranes are moist.     Pharynx: No posterior oropharyngeal erythema.  Eyes:     General: No scleral icterus.       Right eye: Discharge present.        Left eye: Discharge present.    Extraocular Movements: Extraocular movements intact.     Pupils: Pupils are equal, round, and reactive to light.     Comments: Diffuse conjunctival injection with clear d/c  No lid swelling  Frequent blinking due to dc No obvious vision loss    Neck:     Musculoskeletal: Normal range of motion and neck supple.  Cardiovascular:     Rate and Rhythm: Normal rate and regular rhythm.  Pulmonary:     Effort: Pulmonary effort is normal.     Breath sounds: Normal breath sounds. No wheezing or rales.  Lymphadenopathy:     Cervical: No cervical adenopathy.  Skin:    General: Skin is warm.     Findings: No erythema or rash.  Neurological:     General: No focal deficit present.     Mental Status: He is alert.     Cranial Nerves: No cranial nerve deficit.  Psychiatric:         Mood and Affect: Mood normal.           Assessment & Plan:   Problem List Items Addressed This Visit      Other   Conjunctivitis - Primary    Acute conjunctivitis-tx for bacterial source in light of severity  polytrim px  Disc use of cool compresses  Handout given Disc hygiene in detail (launder sheets/towels)  Try not to touch eyes Update if not starting to improve in a week or  if worsening  -esp vision loss/eye pain, fever or facial swelling

## 2018-01-20 NOTE — Patient Instructions (Signed)
Try wet cold compress when you can - this will help the itch and swelling  Wash hands  Try not to rub eyes Wash glasses frequently   Use the polytrim drops as directed  Update if not starting to improve in a week or if worsening

## 2018-03-17 ENCOUNTER — Other Ambulatory Visit: Payer: Self-pay | Admitting: Family Medicine

## 2018-03-24 ENCOUNTER — Telehealth: Payer: Self-pay | Admitting: Family Medicine

## 2018-03-24 ENCOUNTER — Ambulatory Visit: Payer: Medicare Other | Admitting: Family Medicine

## 2018-03-24 NOTE — Telephone Encounter (Signed)
Left message asking pt to call office please r/s appointment with dr Para March  Out of office today

## 2018-03-28 ENCOUNTER — Encounter: Payer: Self-pay | Admitting: Family Medicine

## 2018-03-28 ENCOUNTER — Ambulatory Visit (INDEPENDENT_AMBULATORY_CARE_PROVIDER_SITE_OTHER): Payer: Medicare Other | Admitting: Family Medicine

## 2018-03-28 VITALS — BP 128/66 | HR 69 | Temp 97.4°F | Ht 70.0 in | Wt 200.2 lb

## 2018-03-28 DIAGNOSIS — R972 Elevated prostate specific antigen [PSA]: Secondary | ICD-10-CM

## 2018-03-28 DIAGNOSIS — N400 Enlarged prostate without lower urinary tract symptoms: Secondary | ICD-10-CM | POA: Diagnosis not present

## 2018-03-28 DIAGNOSIS — E119 Type 2 diabetes mellitus without complications: Secondary | ICD-10-CM | POA: Diagnosis not present

## 2018-03-28 LAB — POCT GLYCOSYLATED HEMOGLOBIN (HGB A1C): Hemoglobin A1C: 6 % — AB (ref 4.0–5.6)

## 2018-03-28 MED ORDER — ONETOUCH ULTRASOFT LANCETS MISC: 3 refills | Status: DC

## 2018-03-28 NOTE — Progress Notes (Signed)
Diabetes:  Using medications without difficulties: yes Hypoglycemic episodes: no, cautions d/w pt.   Hyperglycemic episodes: no Feet problems:no Blood Sugars averaging: lowest 94, max 130s on home checks.  Usually 110-120 eye exam within last year:yes A1c 6.  D/w pt.   He is working on diet.    H/o PSA elevation, usually 4-5 at baseline.  Repeat pending.   We talked about his situation in general with mult family members living into their 90s. His father turned 100, still living in [place], [place].  He may have a life expectancy where it is still reasonable to check a PSA at this point, but we talked about eventually having him "graduate" from PSA screening as the benefit may decrease.  Flu shot encouraged, he can get it at the pharmacy.    Meds, vitals, and allergies reviewed.  ROS: Per HPI unless specifically indicated in ROS section   GEN: nad, alert and oriented HEENT: mucous membranes moist NECK: supple w/o LA CV: rrr. PULM: ctab, no inc wob ABD: soft, +bs EXT: no edema SKIN: well perfused.

## 2018-03-28 NOTE — Patient Instructions (Signed)
PSA on the way out today.  We'll contact you with your lab report.  Recheck in about 6 months at a physical.  If you have any lows, then cut the actos in half and let me know.  Take care.  Glad to see you.

## 2018-03-29 LAB — PSA: PSA: 4.4 ng/mL — ABNORMAL HIGH (ref <=4.0)

## 2018-03-30 NOTE — Assessment & Plan Note (Signed)
  H/o PSA elevation, usually 4-5 at baseline.  Repeat pending.   We talked about his situation in general with mult family members living into their 90s. His father turned 100, still living in [place], [place].  He may have a life expectancy where it is still reasonable to check a PSA at this point, but we talked about eventually having him "graduate" from PSA screening as the benefit may decrease.  See notes on labs.

## 2018-03-30 NOTE — Assessment & Plan Note (Signed)
Recheck in about 6 months at a physical.  If any lows, then cut the actos in half and he will let me know.  He agrees.

## 2018-06-26 ENCOUNTER — Encounter: Payer: Self-pay | Admitting: Family Medicine

## 2018-06-26 ENCOUNTER — Other Ambulatory Visit: Payer: Self-pay

## 2018-06-26 ENCOUNTER — Ambulatory Visit (INDEPENDENT_AMBULATORY_CARE_PROVIDER_SITE_OTHER): Payer: Medicare Other | Admitting: Family Medicine

## 2018-06-26 VITALS — BP 136/68 | HR 69 | Temp 98.1°F | Wt 197.5 lb

## 2018-06-26 DIAGNOSIS — R300 Dysuria: Secondary | ICD-10-CM | POA: Diagnosis not present

## 2018-06-26 LAB — POC URINALSYSI DIPSTICK (AUTOMATED)
Bilirubin, UA: NEGATIVE
Blood, UA: 50
Glucose, UA: NEGATIVE
Ketones, UA: NEGATIVE
Leukocytes, UA: NEGATIVE
Nitrite, UA: NEGATIVE
Protein, UA: POSITIVE — AB
Spec Grav, UA: >=1.03 — AB (ref 1.010–1.025)
Urobilinogen, UA: 0.2 E.U./dL
pH, UA: 5.5 (ref 5.0–8.0)

## 2018-06-26 MED ORDER — SULFAMETHOXAZOLE-TRIMETHOPRIM 400-80 MG PO TABS: 1.0000 | ORAL_TABLET | Freq: Two times a day (BID) | ORAL | 0 refills | Status: DC

## 2018-06-26 NOTE — Patient Instructions (Signed)
Presumed bladder infection, not prostate infection.  Drink plenty of water and start septra today.  Take care.  Glad to see you.  Go to the lab on the way out.  We'll contact you with your lab report.

## 2018-06-26 NOTE — Progress Notes (Signed)
His father recently died at age 76.  Condolences offered.  His father was able to make the decision to go to hospice care.  He is going to have a service for his father when pandemic restrictions are lifted.    Sx started about 3-4 days ago.  First noted burning with urination.  Then had suprapubic pain.  Fecal urgency.  No known h/o prostatitis.  No fevers.  Lower back pain.  Some nausea prev.  No vomiting.  No discharge.    No SOB, no CP.    Pandemic consideration d/w pt.    Meds, vitals, and allergies reviewed.   ROS: Per HPI unless specifically indicated in ROS section   nad ncat rrr ctab abd soft, not ttp No BLE edema.  Prostate not ttp No cva pain.

## 2018-06-28 LAB — URINE CULTURE
MICRO NUMBER:: 496205
SPECIMEN QUALITY:: ADEQUATE

## 2018-06-30 DIAGNOSIS — R300 Dysuria: Secondary | ICD-10-CM | POA: Insufficient documentation

## 2018-06-30 NOTE — Assessment & Plan Note (Signed)
Presumed bladder infection, not prostate infection.  Drink plenty of water and start septra today.  See notes on labs. Discussed with patient about differential diagnosis. Nontoxic.  Okay for outpatient follow-up.  He agrees with plan.  Condolences offered regarding the death of his father.

## 2018-07-24 DIAGNOSIS — H5203 Hypermetropia, bilateral: Secondary | ICD-10-CM | POA: Diagnosis not present

## 2018-07-24 DIAGNOSIS — Z7984 Long term (current) use of oral hypoglycemic drugs: Secondary | ICD-10-CM | POA: Diagnosis not present

## 2018-07-24 DIAGNOSIS — H524 Presbyopia: Secondary | ICD-10-CM | POA: Diagnosis not present

## 2018-07-24 DIAGNOSIS — E119 Type 2 diabetes mellitus without complications: Secondary | ICD-10-CM | POA: Diagnosis not present

## 2018-07-24 DIAGNOSIS — H2513 Age-related nuclear cataract, bilateral: Secondary | ICD-10-CM | POA: Diagnosis not present

## 2018-08-05 DIAGNOSIS — Z1159 Encounter for screening for other viral diseases: Secondary | ICD-10-CM | POA: Diagnosis not present

## 2018-08-08 ENCOUNTER — Other Ambulatory Visit: Payer: Self-pay | Admitting: Family Medicine

## 2018-09-08 ENCOUNTER — Telehealth: Payer: Self-pay

## 2018-09-08 DIAGNOSIS — I442 Atrioventricular block, complete: Secondary | ICD-10-CM | POA: Diagnosis not present

## 2018-09-08 DIAGNOSIS — R945 Abnormal results of liver function studies: Secondary | ICD-10-CM | POA: Diagnosis not present

## 2018-09-08 DIAGNOSIS — R918 Other nonspecific abnormal finding of lung field: Secondary | ICD-10-CM | POA: Diagnosis not present

## 2018-09-08 DIAGNOSIS — R509 Fever, unspecified: Secondary | ICD-10-CM | POA: Diagnosis not present

## 2018-09-08 DIAGNOSIS — R05 Cough: Secondary | ICD-10-CM | POA: Diagnosis not present

## 2018-09-08 DIAGNOSIS — R748 Abnormal levels of other serum enzymes: Secondary | ICD-10-CM | POA: Diagnosis not present

## 2018-09-08 DIAGNOSIS — R197 Diarrhea, unspecified: Secondary | ICD-10-CM | POA: Diagnosis not present

## 2018-09-08 DIAGNOSIS — Z87891 Personal history of nicotine dependence: Secondary | ICD-10-CM | POA: Diagnosis not present

## 2018-09-08 DIAGNOSIS — J189 Pneumonia, unspecified organism: Secondary | ICD-10-CM | POA: Diagnosis not present

## 2018-09-08 DIAGNOSIS — I444 Left anterior fascicular block: Secondary | ICD-10-CM | POA: Diagnosis not present

## 2018-09-08 DIAGNOSIS — I440 Atrioventricular block, first degree: Secondary | ICD-10-CM | POA: Diagnosis not present

## 2018-09-08 NOTE — Telephone Encounter (Signed)
[place] Night - Client TELEPHONE ADVICE RECORD [facility] Patient Name: Jacob Ponce Gender: Male DOB: [birth year] Age: 78 Y 82 M 16 D Return Phone Number: [phone] (Primary) Address: City/State/Zip: [place] [address] Client [facility] Night - Client Client Site [place] Physician Renford Dills - MD Contact Type Call Who Is Calling Patient / Member / Family / Caregiver Call Type Triage / Clinical Relationship To Patient Self Return Phone Number [phone] (Primary) Chief Complaint Sore Throat Reason for Call Symptomatic / Request for Health Information Initial Comment Caller states he has cough, sore throat, fever of 101. Oxygen is way down. 91 Denies SOB Additional Comment Started Saturday night. Translation No Nurse Assessment Nurse: Cox, RN, [employer]icon Date/Time (Eastern Time): 09/08/2018 7:49:25 AM Confirm and document reason for call. If symptomatic, describe symptoms. ---Caller states he has a cough, fever 101, and sore throat. O2 down to 91. Started Saturday. Has the patient had close contact with a person known or suspected to have the novel coronavirus illness OR traveled / lives in area with major community spread (including international travel) in the last 14 days from the onset of symptoms? * If Asymptomatic, screen for exposure and travel within the last 14 days. ---No Does the patient have any new or worsening symptoms? ---Yes Will a triage be completed? ---Yes Related visit to physician within the last 2 weeks? ---No Does the PT have any chronic conditions? (i.e. diabetes, asthma, this includes High risk factors for pregnancy, etc.) ---Yes List chronic conditions. ---diabetes Is this a behavioral health or substance abuse call? ---No Guidelines Guideline Title Affirmed Question Affirmed Notes Nurse Date/Time (Eastern Time) Cough - Acute Non- Productive Chest pain  (Exception: MILD central chest pain, present only when coughing) Cox, RN, [employer]icon 04/12/9022 0:97:35 AM Disp. Time Eilene Ghazi Time) Disposition Final User PLEASE NOTE: All timestamps contained within this report are represented as [place] Standard Time. CONFIDENTIALTY NOTICE: This fax transmission is intended only for the addressee. It contains information that is legally privileged, confidential or otherwise protected from use or disclosure. If you are not the intended recipient, you are strictly prohibited from reviewing, disclosing, copying using or disseminating any of this information or taking any action in reliance on or regarding this information. If you have received this fax in error, please notify us immediately by telephone so that we can arrange for its return to [place]. Phone: [phone], Toll-Free: [phone], Fax: [phone] Page: 2 of 2 Call Id: [id number] 09/08/2018 7:53:24 AM Go to ED Now Yes Cox, RN, [employer] Caller Disagree/Comply Comply Caller Understands Yes PreDisposition Did not know what to do Care Advice Given Per Guideline GO TO ED NOW: * You need to be seen in the Emergency Department. DRIVING: Another adult should drive. CARE ADVICE given per Cough - Acute Non-Productive (Adult) guideline. Referrals [facility] [place] - ED

## 2018-09-08 NOTE — Telephone Encounter (Signed)
Agree with ER, I'll await the ER notes.

## 2018-09-08 NOTE — Telephone Encounter (Signed)
I spoke with pt and he is trying to get up the energy to get up off the couch and go to the [facility] ED in [facility]. Pt cked pulse ox while I was on the phone and it was 94% now. Pt did say he was going to go to ED very soon. FYI to Dr Damita Dunnings.

## 2018-09-09 ENCOUNTER — Encounter: Payer: Self-pay | Admitting: Family Medicine

## 2018-09-09 DIAGNOSIS — I442 Atrioventricular block, complete: Secondary | ICD-10-CM | POA: Diagnosis not present

## 2018-09-09 DIAGNOSIS — I444 Left anterior fascicular block: Secondary | ICD-10-CM | POA: Diagnosis not present

## 2018-09-10 ENCOUNTER — Telehealth: Payer: Self-pay

## 2018-09-10 DIAGNOSIS — B181 Chronic viral hepatitis B without delta-agent: Secondary | ICD-10-CM | POA: Insufficient documentation

## 2018-09-10 DIAGNOSIS — B1910 Unspecified viral hepatitis B without hepatic coma: Secondary | ICD-10-CM

## 2018-09-10 NOTE — Telephone Encounter (Signed)
Pt sent my chart note to [facility] with note Hep B infection. I called pt and he was seen at [facility] ED on 09/08/18. Pt said dx with double pneumonia; given Zithromax and today is day 3 and Amoxicillin 125 mg taking one pill bid  Qty # 14. Pt has fever 99 today; dry cough, scratchy S/T, SOB upon exertion; diarrhea; pt not sure last time had diarrhea, thinks maybe last wk. ED did a lot of bloodwork and pt has Hep B infection. Pt has pending covid test; pt was supposed to get cb today but so far no call. Pt was to schedule FU with Dr Damita Dunnings ASAP. Please advise.  pt is self quarantining. [facility] [place].

## 2018-09-10 NOTE — Telephone Encounter (Signed)
Call patient.  He feels better in the meantime.  He is not short of breath.  On antibiotics for pneumonia.  Discussed with patient about getting follow-up x-ray in about 1 month, assuming his respiratory situation does not worsen in the meantime.  COVID testing is negative.  He has had some decrease in stool output with stool consistency changes noted.  See my chart messages.  Discussed.  He can try Colace and/or MiraLAX if needed.  Discussed hepatitis diagnosis and we can get follow-up labs done in the near future.  He agrees with all that.  Routine liver cautions given, avoid alcohol and Tylenol.  Drink enough fluids to stay well-hydrated.  He will call the triage line in the meantime if he is feeling worse.  Please call patient on Thursday and set up 30-minute office visit appointment for early next week when possible.  We can do labs at the visit, he does not have to fast.  Thanks.

## 2018-09-11 NOTE — Telephone Encounter (Signed)
Left message for patient to call back  

## 2018-09-11 NOTE — Telephone Encounter (Signed)
Pt returned your call and is requesting a cb °

## 2018-09-11 NOTE — Telephone Encounter (Signed)
Spoke with patient and appointment scheduled 

## 2018-09-14 ENCOUNTER — Encounter (HOSPITAL_BASED_OUTPATIENT_CLINIC_OR_DEPARTMENT_OTHER): Payer: Self-pay | Admitting: Emergency Medicine

## 2018-09-14 ENCOUNTER — Other Ambulatory Visit: Payer: Self-pay

## 2018-09-14 ENCOUNTER — Emergency Department (HOSPITAL_BASED_OUTPATIENT_CLINIC_OR_DEPARTMENT_OTHER)
Admission: EM | Admit: 2018-09-14 | Discharge: 2018-09-14 | Disposition: A | Discharge status: Home / Self Care | Payer: Medicare Other | Attending: Emergency Medicine | Admitting: Emergency Medicine

## 2018-09-14 DIAGNOSIS — Z7984 Long term (current) use of oral hypoglycemic drugs: Secondary | ICD-10-CM | POA: Insufficient documentation

## 2018-09-14 DIAGNOSIS — K5900 Constipation, unspecified: Secondary | ICD-10-CM | POA: Diagnosis not present

## 2018-09-14 DIAGNOSIS — E119 Type 2 diabetes mellitus without complications: Secondary | ICD-10-CM | POA: Insufficient documentation

## 2018-09-14 DIAGNOSIS — R1084 Generalized abdominal pain: Secondary | ICD-10-CM | POA: Diagnosis not present

## 2018-09-14 DIAGNOSIS — R197 Diarrhea, unspecified: Secondary | ICD-10-CM | POA: Diagnosis not present

## 2018-09-14 DIAGNOSIS — Z79899 Other long term (current) drug therapy: Secondary | ICD-10-CM | POA: Diagnosis not present

## 2018-09-14 DIAGNOSIS — Z87891 Personal history of nicotine dependence: Secondary | ICD-10-CM | POA: Diagnosis not present

## 2018-09-14 DIAGNOSIS — Z7982 Long term (current) use of aspirin: Secondary | ICD-10-CM | POA: Insufficient documentation

## 2018-09-14 DIAGNOSIS — I10 Essential (primary) hypertension: Secondary | ICD-10-CM | POA: Insufficient documentation

## 2018-09-14 MED ORDER — FLEET ENEMA 7-19 GM/118ML RE ENEM
1.0000 | ENEMA | Freq: Once | RECTAL | Status: AC
  Administered 2018-09-14: 1 via RECTAL
  Filled 2018-09-14: qty 1

## 2018-09-14 NOTE — Discharge Instructions (Addendum)
You are seen in the emergency department for abdominal pain and constipation.  You had an enema here with some improvement in your symptoms.  Will be important for you to drink plenty of fluids and increase your fiber and exercise.  Please contact your doctor regarding further medications that you may need to take while you are still fighting your pneumonia.  Return if any concerns.

## 2018-09-14 NOTE — ED Triage Notes (Signed)
Reports no bm for 8 days.  Took 2 stool softeners today with no success.

## 2018-09-14 NOTE — ED Notes (Signed)
Pt passed large formed stool

## 2018-09-14 NOTE — ED Provider Notes (Signed)
[facility] [place] EMERGENCY DEPARTMENT Provider Note   CSN: [id number] Arrival date & time: 09/14/18  1733     History   Chief Complaint Chief Complaint  Patient presents with  . Constipation    HPI Jacob Ponce is a 78 y.o. male.  He was recently diagnosed with pneumonia and is on Zithromax and Augmentin and is pending COVID testing, currently isolating.  He said he has not had a bowel movement for 8 days and is just passing a little brown liquid.  Its associated with some abdominal crampy pain.  He tried some oral stool softeners today without any improvement.  No recent fever and he says his breathing feels improved although not back to baseline.  No prior history of constipation.  No rectal bleeding.     The history is provided by the patient.  Constipation Severity:  Severe Time since last bowel movement:  8 days Progression:  Unchanged Chronicity:  New Context: dehydration and medication   Stool description:  Watery Relieved by:  Nothing Worsened by:  Nothing Ineffective treatments:  Stool softeners Associated symptoms: abdominal pain and diarrhea   Associated symptoms: no back pain, no dysuria, no fever, no hematochezia, no nausea and no vomiting   Risk factors: change in medication, recent antibiotic use and recent illness     Past Medical History:  Diagnosis Date  . BPH (benign prostatic hyperplasia)   . Diabetes mellitus    Type II, controlled  . Fracture 2011   Right proximal humerus, resolved w/o deficit  . History of cardiac cath 1999   and Tilt Table Test, both negative  . Hyperlipidemia   . Hypertension   . Impotence of organic origin   . Special screening for malignant neoplasms of other sites     Patient Active Problem List   Diagnosis Date Noted  . HBV (hepatitis B virus) infection 09/10/2018  . Dysuria 06/30/2018  . Conjunctivitis 01/20/2018  . Bradycardia 09/22/2017  . Healthcare maintenance 09/13/2016  . 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 PSA elevation 03/23/2016  . Advance care planning 03/30/2014  . Medicare annual wellness visit, subsequent 08/20/2010  . HLD (hyperlipidemia) 09/14/2009  . Essential hypertension 09/14/2009  . ORGANIC IMPOTENCE 09/14/2009  . BPH (benign prostatic hyperplasia) 09/14/2009  . Diabetes mellitus without complication (HCC) 09/14/2009    Past Surgical History:  Procedure Laterality Date  . APPENDECTOMY    . TIBIA FRACTURE SURGERY     Left, resolved without deficit  . TONSILLECTOMY          Home Medications    Prior to Admission medications   Medication Sig Start Date End Date Taking? Authorizing Provider  aspirin 81 MG tablet Take 81 mg by mouth daily.      [provider]  doxazosin (CARDURA) 8 MG tablet Take 1 tablet (8 mg total) by mouth at bedtime. 09/20/17   Joaquim Namuncan, Graham S, MD  glucose blood test strip USE TO CHECK BLOOD SUGAR DAILY. DX: E11.9 03/17/18   Joaquim Namuncan, Graham S, MD  Lancets [facility](ONETOUCH ULTRASOFT) lancets Use to check blood sugar daily.  Dx: E11.9 03/28/18   Joaquim Namuncan, Graham S, MD  loratadine (CLARITIN) 10 MG tablet Take 10 mg by mouth daily as needed for allergies.    [provider]  losartan (COZAAR) 50 MG tablet Take 1 tablet (50 mg total) by mouth daily. 09/20/17   Joaquim Namuncan, Graham S, MD  metFORMIN (GLUCOPHAGE) 500 MG tablet TAKE 1 TABLET TWICE DAILY WITH A MEAL. 09/20/17  Joaquim Namuncan, Graham S, MD  pioglitazone (ACTOS) 30 MG tablet TAKE 1 TABLET BY MOUTH EVERY DAY 08/11/18   Joaquim Namuncan, Graham S, MD  simvastatin (ZOCOR) 40 MG tablet Take 1 tablet (40 mg total) by mouth at bedtime. 09/20/17   Joaquim Namuncan, Graham S, MD  sulfamethoxazole-trimethoprim (BACTRIM) 400-80 MG tablet Take 1 tablet by mouth 2 (two) times daily. 06/26/18   Joaquim Namuncan, Graham S, MD    Family History Family History  Problem Relation Age of Onset  . Cancer Father        colon  . Seizures Father        after head injury  . Colon cancer Father   . Colon cancer Mother   . Arthritis Brother   .  Prostate cancer Neg Hx     Social History Social History   Tobacco Use  . Smoking status: Former Smoker    Types: Cigarettes    Quit date: 02/05/1985    Years since quitting: 33.6  . Smokeless tobacco: Never Used  . Tobacco comment: Smoked from 1966 to 1987  Substance Use Topics  . Alcohol use: No    Alcohol/week: 0.0 standard drinks  . Drug use: No     Allergies   Patient has no known allergies.   Review of Systems Review of Systems  Constitutional: Negative for fever.  HENT: Negative for sore throat.   Eyes: Negative for visual disturbance.  Respiratory: Positive for cough and shortness of breath.   Cardiovascular: Negative for chest pain.  Gastrointestinal: Positive for abdominal pain, constipation and diarrhea. Negative for hematochezia, nausea and vomiting.  Genitourinary: Negative for dysuria.  Musculoskeletal: Negative for back pain.  Skin: Negative for rash.  Neurological: Negative for headaches.     Physical Exam Updated Vital Signs BP (!) 167/90 (BP Location: Right Arm)   Pulse 84   Temp 97.8 F (36.6 C)   Resp 18   Ht 5\' 10"  (1.778 m)   Wt 90.7 kg   SpO2 99%   BMI 28.70 kg/m   Physical Exam Vitals signs and nursing note reviewed.  Constitutional:      Appearance: He is well-developed.  HENT:     Head: Normocephalic and atraumatic.  Eyes:     Conjunctiva/sclera: Conjunctivae normal.  Neck:     Musculoskeletal: Neck supple.  Cardiovascular:     Rate and Rhythm: Normal rate and regular rhythm.     Heart sounds: No murmur.  Pulmonary:     Effort: Pulmonary effort is normal. No respiratory distress.     Breath sounds: Normal breath sounds.  Abdominal:     Palpations: Abdomen is soft.     Tenderness: There is no abdominal tenderness.  Musculoskeletal: Normal range of motion.     Right lower leg: No edema.     Left lower leg: No edema.  Skin:    General: Skin is warm and dry.     Capillary Refill: Capillary refill takes less than 2  seconds.  Neurological:     General: No focal deficit present.     Mental Status: He is alert and oriented to person, place, and time.      ED Treatments / Results  Labs (all labs ordered are listed, but only abnormal results are displayed) Labs Reviewed - No data to display  EKG None  Radiology No results found.  Procedures Procedures (including critical care time)  Medications Ordered in ED Medications - No data to display   Initial Impression / Assessment and Plan /  ED Course  I have reviewed the triage vital signs and the nursing notes.  Pertinent labs & imaging results that were available during my care of the patient were reviewed by me and considered in my medical decision making (see chart for details).  Clinical Course as of Sep 13 2312  Nancy Fetter Sep 14, 2018  1823 Rectal exam with nurse Claris Pong chaperone.  He was diffusely tender but no masses and no stool in vault.  Have ordered him an enema.   [MB]  1905 Patient had a fleets enema with good relief and a large bowel movement.  He says he still feels a little crampy but his pain is improved.  He is comfortable going home and I asked that he contact his primary care doctor for continued management of this.   [MB]    Clinical Course User Index [MB] Hayden Rasmussen, MD        Final Clinical Impressions(s) / ED Diagnoses   Final diagnoses:  Generalized abdominal pain  Constipation, unspecified constipation type    ED Discharge Orders    None       Hayden Rasmussen, MD 09/14/18 2314

## 2018-09-15 ENCOUNTER — Telehealth: Payer: Self-pay

## 2018-09-15 ENCOUNTER — Telehealth: Payer: Self-pay | Admitting: Family Medicine

## 2018-09-15 ENCOUNTER — Encounter: Payer: Self-pay | Admitting: Family Medicine

## 2018-09-15 NOTE — Telephone Encounter (Signed)
[place] Night - Client TELEPHONE ADVICE RECORD [facility] Patient Name: Jacob Ponce Gender: Male DOB: [birth year] Age: 78 Y 43 M 22 D Return Phone Number: [phone] (Primary) Address: City/State/Zip: [place] [address] Client  [facility] Night - Client Client Site [place] Physician Renford Dills - MD Contact Type Call Who Is Calling Patient / Member / Family / Caregiver Call Type Triage / Clinical Relationship To Patient Self Return Phone Number [phone] (Primary) Chief Complaint Constipation Reason for Call Symptomatic / Request for [place] has pneumonia and has not had a BM in 8 days. Translation No Nurse Assessment Nurse: Hardin Negus, RN, Mardene Celeste Date/Time Eilene Ghazi Time): 09/14/2018 5:08:43 PM Confirm and document reason for call. If symptomatic, describe symptoms. ---Caller has pneumonia and has not had a BM in 8 days. He is feeling better from the pneumonia. He has not been able to eat or drink. He is having stomach pain and has had no vomiting. He has taken stool softener.. Has the patient had close contact with a person known or suspected to have the novel coronavirus illness OR traveled / lives in area with major community spread (including international travel) in the last 14 days from the onset of symptoms? * If Asymptomatic, screen for exposure and travel within the last 14 days. ---No Does the patient have any new or worsening symptoms? ---Yes Will a triage be completed? ---Yes Related visit to physician within the last 2 weeks? ---No Does the PT have any chronic conditions? (i.e. diabetes, asthma, this includes High risk factors for pregnancy, etc.) ---Yes List chronic conditions. ---diabetic Is this a behavioral health or substance abuse call? ---No Guidelines Guideline Title Affirmed Question Affirmed Notes Nurse Date/Time  (Eastern Time) Constipation [1] Constant abdominal pain AND [2] present > 2 hours Hardin Negus, RN, Mardene Celeste 09/14/2018 5:11:08 PM Disp. Time Eilene Ghazi Time) Disposition Final User PLEASE NOTE: All timestamps contained within this report are represented as [place] Standard Time. CONFIDENTIALTY NOTICE: This fax transmission is intended only for the addressee. It contains information that is legally privileged, confidential or otherwise protected from use or disclosure. If you are not the intended recipient, you are strictly prohibited from reviewing, disclosing, copying using or disseminating any of this information or taking any action in reliance on or regarding this information. If you have received this fax in error, please notify us immediately by telephone so that we can arrange for its return to [place]. Phone: [phone], Toll-Free: [phone], Fax: [phone] Page: 2 of 2 Call Id: [id number] 09/14/2018 5:12:43 PM See HCP within 4 Hours (or PCP triage) Yes Hardin Negus, RN, [place] Disagree/Comply Comply Caller Understands Yes PreDisposition Call Doctor Care Advice Given Per Guideline CARE ADVICE given per Constipation (Adult) guideline. NOTHING BY MOUTH: Do not eat or drink anything for now. CALL BACK IF: * You become worse. SEE HCP WITHIN 4 HOURS (OR PCP TRIAGE): Referrals [facility] - ED

## 2018-09-15 NOTE — Telephone Encounter (Signed)
Given to Triage

## 2018-09-15 NOTE — Telephone Encounter (Signed)
Noted. Thanks. Will see at OV.  

## 2018-09-15 NOTE — Telephone Encounter (Signed)
[facility] LPN said she has already triaged pt and attached to the my chart message from pt on 09/15/18. Thank you.

## 2018-09-15 NOTE — Telephone Encounter (Signed)
Spoke to patient by telephone and was advised that he is still having some abdominal pain level of 2-5. Patient stated that he is not able to eat much. Patient stated that he has not had a good bowel movement since the first of August. Patient stated that when he went to the ER they told him that he did not have a blockage. Patient stated that he does not have a fever and the pneumonia seems to be cleared up. Patient stated that he just wanted to keep Dr. Damita Dunnings informed on what is going on with him.Patient stated that he is sipping water and able to urinate at this time. Patient stated that he already has an appointment scheduled with Dr. Damita Dunnings on 09/18/18. Patient stated that he is keeping a record of all that happens before his appointment with Dr. Damita Dunnings.

## 2018-09-15 NOTE — Telephone Encounter (Signed)
See mychart message. Please triage patient and let me know how he is doing today. Thanks.

## 2018-09-15 NOTE — Telephone Encounter (Signed)
Patient seen in the ER yesterday pm and treated for constipation.  He is to follow up only as needed with PCP.  Patient has OV schedule with PCP for 11minutes on 09/18/18.

## 2018-09-16 ENCOUNTER — Encounter: Payer: Self-pay | Admitting: Family Medicine

## 2018-09-16 NOTE — Telephone Encounter (Signed)
Noted. Thanks.

## 2018-09-18 ENCOUNTER — Other Ambulatory Visit: Payer: Self-pay

## 2018-09-18 ENCOUNTER — Encounter: Payer: Self-pay | Admitting: Family Medicine

## 2018-09-18 ENCOUNTER — Ambulatory Visit (INDEPENDENT_AMBULATORY_CARE_PROVIDER_SITE_OTHER): Payer: Medicare Other | Admitting: Family Medicine

## 2018-09-18 ENCOUNTER — Ambulatory Visit (INDEPENDENT_AMBULATORY_CARE_PROVIDER_SITE_OTHER)
Admission: RE | Admit: 2018-09-18 | Discharge: 2018-09-18 | Disposition: A | Discharge status: Home / Self Care | Payer: Medicare Other | Source: Ambulatory Visit | Attending: Family Medicine | Admitting: Family Medicine

## 2018-09-18 VITALS — BP 104/78 | HR 92 | Temp 98.1°F | Resp 20 | Ht 70.0 in | Wt 176.4 lb

## 2018-09-18 DIAGNOSIS — K5900 Constipation, unspecified: Secondary | ICD-10-CM

## 2018-09-18 DIAGNOSIS — J189 Pneumonia, unspecified organism: Secondary | ICD-10-CM

## 2018-09-18 DIAGNOSIS — B1910 Unspecified viral hepatitis B without hepatic coma: Secondary | ICD-10-CM | POA: Diagnosis not present

## 2018-09-18 DIAGNOSIS — E785 Hyperlipidemia, unspecified: Secondary | ICD-10-CM | POA: Diagnosis not present

## 2018-09-18 DIAGNOSIS — I10 Essential (primary) hypertension: Secondary | ICD-10-CM | POA: Diagnosis not present

## 2018-09-18 LAB — CBC WITH DIFFERENTIAL/PLATELET
Basophils Absolute: 0.1 10*3/uL (ref 0.0–0.1)
Basophils Relative: 0.9 % (ref 0.0–3.0)
Eosinophils Absolute: 0 10*3/uL (ref 0.0–0.7)
Eosinophils Relative: 0.2 % (ref 0.0–5.0)
HCT: 46 % (ref 39.0–52.0)
Hemoglobin: 15.5 g/dL (ref 13.0–17.0)
Lymphocytes Relative: 17.1 % (ref 12.0–46.0)
Lymphs Abs: 2 10*3/uL (ref 0.7–4.0)
MCHC: 33.8 g/dL (ref 30.0–36.0)
MCV: 87.5 fl (ref 78.0–100.0)
Monocytes Absolute: 1.4 10*3/uL — ABNORMAL HIGH (ref 0.1–1.0)
Monocytes Relative: 12.1 % — ABNORMAL HIGH (ref 3.0–12.0)
Neutro Abs: 8.3 10*3/uL — ABNORMAL HIGH (ref 1.4–7.7)
Neutrophils Relative %: 69.7 % (ref 43.0–77.0)
Platelets: 595 10*3/uL — ABNORMAL HIGH (ref 150.0–400.0)
RBC: 5.25 Mil/uL (ref 4.22–5.81)
RDW: 14.8 % (ref 11.5–15.5)
WBC: 11.9 10*3/uL — ABNORMAL HIGH (ref 4.0–10.5)

## 2018-09-18 LAB — COMPREHENSIVE METABOLIC PANEL
ALT: 511 U/L — ABNORMAL HIGH (ref 0–53)
AST: 165 U/L — ABNORMAL HIGH (ref 0–37)
Albumin: 4.1 g/dL (ref 3.5–5.2)
Alkaline Phosphatase: 84 U/L (ref 39–117)
BUN: 26 mg/dL — ABNORMAL HIGH (ref 6–23)
CO2: 24 mEq/L (ref 19–32)
Calcium: 10 mg/dL (ref 8.4–10.5)
Chloride: 95 mEq/L — ABNORMAL LOW (ref 96–112)
Creatinine, Ser: 1.1 mg/dL (ref 0.40–1.50)
GFR: 64.98 mL/min (ref >60.00)
Glucose, Bld: 149 mg/dL — ABNORMAL HIGH (ref 70–99)
Potassium: 3.5 mEq/L (ref 3.5–5.1)
Sodium: 133 mEq/L — ABNORMAL LOW (ref 135–145)
Total Bilirubin: 1.5 mg/dL — ABNORMAL HIGH (ref 0.2–1.2)
Total Protein: 7.1 g/dL (ref 6.0–8.3)

## 2018-09-18 MED ORDER — LOSARTAN POTASSIUM 50 MG PO TABS: 50.0000 mg | ORAL_TABLET | Freq: Every day | ORAL | 0 refills | Status: DC

## 2018-09-18 NOTE — Progress Notes (Signed)
Weight loss noted.  PNA and HBV infection noted.  He is off abx currently for PNA.  No fevers now. Recent labs, HBV and LFTS d/w pt.  We talked about HIV screening, pending. He has some lower back pain which could have been due to PNA.     He has been episodically lightheaded.  He had dec in appetite and vomiting.  Recent constipation.  Last BM was with enema at ER 09/14/2018.  He is still passing gas.  He has had some BRBPR with straining for a BM.  No abd pain except for occ epigastric discomfort, esp with inc PO intake.    PMH and SH reviewed  ROS: Per HPI unless specifically indicated in ROS section   Meds, vitals, and allergies reviewed.   GEN: nad, alert and oriented HEENT: ncat NECK: supple w/o LA CV: rrr.  PULM: ctab, no inc wob, no focal dec in BS ABD: soft, +bs EXT: no edema SKIN: no acute rash

## 2018-09-18 NOTE — Patient Instructions (Addendum)
Stop simvastatin for now and if lightheaded then skip losartan.  If your sugar is low, then skip the actos.    We'll contact you with your lab report. If you feel worse, then go to the ER or update [place].  Try an enema in the meantime and update me as needed.  Take care.  Glad to see you.

## 2018-09-19 ENCOUNTER — Other Ambulatory Visit: Payer: Self-pay | Admitting: Family Medicine

## 2018-09-19 DIAGNOSIS — B1910 Unspecified viral hepatitis B without hepatic coma: Secondary | ICD-10-CM

## 2018-09-19 LAB — HIV ANTIBODY (ROUTINE TESTING W REFLEX): HIV 1&2 Ab, 4th Generation: NONREACTIVE

## 2018-09-21 ENCOUNTER — Encounter: Payer: Self-pay | Admitting: Family Medicine

## 2018-09-21 DIAGNOSIS — K5900 Constipation, unspecified: Secondary | ICD-10-CM | POA: Insufficient documentation

## 2018-09-21 DIAGNOSIS — J189 Pneumonia, unspecified organism: Secondary | ICD-10-CM | POA: Insufficient documentation

## 2018-09-21 NOTE — Assessment & Plan Note (Signed)
Benign abdominal exam and still passing gas.  Bowel regimen discussed with patient.  Update me as needed.

## 2018-09-21 NOTE — Assessment & Plan Note (Signed)
No fevers.  Done with antibiotics.  Not short of breath.  Still okay for outpatient follow-up at this point.  X-ray discussed with patient and reviewed at office visit. >25 minutes spent in face to face time with patient, >50% spent in counselling or coordination of care.

## 2018-09-21 NOTE — Assessment & Plan Note (Signed)
Hold losartan since he has been lightheaded.  See after visit summary.  Routine cautions given to patient.  He agrees.

## 2018-09-21 NOTE — Assessment & Plan Note (Addendum)
See notes on labs.  HIV negative.  Refer to hepatology.  At this point still okay for outpatient follow-up.  Routine instructions given to patient to limit infection risk.  >25 minutes spent in face to face time with patient, >50% spent in counselling or coordination of care.

## 2018-09-21 NOTE — Assessment & Plan Note (Signed)
Hold statin for now given hepatitis B infection and LFT elevation.  Discussed with patient.  See after visit summary.

## 2018-09-22 ENCOUNTER — Telehealth: Payer: Self-pay | Admitting: Family Medicine

## 2018-09-22 NOTE — Telephone Encounter (Signed)
Noted  

## 2018-09-22 NOTE — Telephone Encounter (Signed)
Please triage this patient about his GI sx, constipation.  I hope he will not need manual disimpaction.  Many thanks.

## 2018-09-22 NOTE — Telephone Encounter (Signed)
I spoke with pt; the blockage in colon is unblocked ;back no longer hurts; FBS was this morning 128 and BP this morning was 111/72. Pt is not losartan or simvastatin. Pt is feeling enormously better. Pt has terrible dizziness when stands up; loses train of thought, feels like he is going to pass out; has not passed out; if bends over and sits down then gets up slowly pt is OK . Pt is using cane for mobility. Pt is trying to get electrolytes back to normal by drinking more water; pt age banana at breakfast. Pt is trying to eat healthy. Pt does not want appt at this point. Pt has had dizziness when standing before. Pt started eating solid food for first time on 09/21/18. Pt has had the dizziness on and off for long term. Advised pt to stand really slowly and if needed due to dizziness sit back down; pt will continue to use cane for stability. Pt will cb with updates. FYI to Dr Damita Dunnings and Dr Darnell Level.

## 2018-09-23 MED ORDER — SIMVASTATIN 40 MG PO TABS: ORAL_TABLET | ORAL | Status: DC

## 2018-09-23 MED ORDER — LOSARTAN POTASSIUM 50 MG PO TABS: ORAL_TABLET | ORAL | Status: DC

## 2018-09-23 NOTE — Telephone Encounter (Signed)
If he feels some better, then I would continue to hold simvastatin and losartan.  I wouldn't stop doxazosin yet as he may have more trouble with BPH off that med.  The lightheadedness may improve with inc in PO intake/inc in PO fluids.  Update [place] as needed.  Thanks.

## 2018-09-23 NOTE — Addendum Note (Signed)
Addended by: Tonia Ghent on: 09/23/2018 02:08 PM   Modules accepted: Orders

## 2018-09-24 ENCOUNTER — Encounter: Payer: Self-pay | Admitting: Family Medicine

## 2018-09-24 NOTE — Telephone Encounter (Signed)
Patient states dizziness resolved in one day. He is drinking plenty of water and is eating "too much" per patient. He is up to 1 mile of walking and tolerating that well. He is back on Miralax as suggested by Dr. Darnell Level earlier today. Patient will keep [place] posted.

## 2018-09-24 NOTE — Telephone Encounter (Signed)
[facility] pcp fyi

## 2018-09-25 NOTE — Telephone Encounter (Signed)
Noted. Thanks.

## 2018-10-02 ENCOUNTER — Other Ambulatory Visit: Payer: Self-pay

## 2018-10-02 ENCOUNTER — Ambulatory Visit: Payer: Medicare Other

## 2018-10-02 ENCOUNTER — Encounter: Payer: Self-pay | Admitting: Family Medicine

## 2018-10-02 ENCOUNTER — Ambulatory Visit (INDEPENDENT_AMBULATORY_CARE_PROVIDER_SITE_OTHER): Payer: Medicare Other | Admitting: Family Medicine

## 2018-10-02 VITALS — BP 110/60 | HR 63 | Temp 97.7°F | Ht 70.0 in | Wt 186.1 lb

## 2018-10-02 DIAGNOSIS — I10 Essential (primary) hypertension: Secondary | ICD-10-CM

## 2018-10-02 DIAGNOSIS — K5900 Constipation, unspecified: Secondary | ICD-10-CM

## 2018-10-02 DIAGNOSIS — E785 Hyperlipidemia, unspecified: Secondary | ICD-10-CM | POA: Diagnosis not present

## 2018-10-02 DIAGNOSIS — B1910 Unspecified viral hepatitis B without hepatic coma: Secondary | ICD-10-CM

## 2018-10-02 DIAGNOSIS — Z125 Encounter for screening for malignant neoplasm of prostate: Secondary | ICD-10-CM

## 2018-10-02 DIAGNOSIS — Z0000 Encounter for general adult medical examination without abnormal findings: Secondary | ICD-10-CM | POA: Diagnosis not present

## 2018-10-02 DIAGNOSIS — E119 Type 2 diabetes mellitus without complications: Secondary | ICD-10-CM

## 2018-10-02 DIAGNOSIS — Z7189 Other specified counseling: Secondary | ICD-10-CM

## 2018-10-02 DIAGNOSIS — J189 Pneumonia, unspecified organism: Secondary | ICD-10-CM

## 2018-10-02 LAB — CBC WITH DIFFERENTIAL/PLATELET
Basophils Absolute: 0 10*3/uL (ref 0.0–0.1)
Basophils Relative: 0.5 % (ref 0.0–3.0)
Eosinophils Absolute: 0.1 10*3/uL (ref 0.0–0.7)
Eosinophils Relative: 1.1 % (ref 0.0–5.0)
HCT: 37.6 % — ABNORMAL LOW (ref 39.0–52.0)
Hemoglobin: 12.8 g/dL — ABNORMAL LOW (ref 13.0–17.0)
Lymphocytes Relative: 30.2 % (ref 12.0–46.0)
Lymphs Abs: 1.7 10*3/uL (ref 0.7–4.0)
MCHC: 34.2 g/dL (ref 30.0–36.0)
MCV: 87.4 fl (ref 78.0–100.0)
Monocytes Absolute: 0.8 10*3/uL (ref 0.1–1.0)
Monocytes Relative: 13.3 % — ABNORMAL HIGH (ref 3.0–12.0)
Neutro Abs: 3.2 10*3/uL (ref 1.4–7.7)
Neutrophils Relative %: 54.9 % (ref 43.0–77.0)
Platelets: 342 10*3/uL (ref 150.0–400.0)
RBC: 4.3 Mil/uL (ref 4.22–5.81)
RDW: 15.3 % (ref 11.5–15.5)
WBC: 5.8 10*3/uL (ref 4.0–10.5)

## 2018-10-02 LAB — COMPREHENSIVE METABOLIC PANEL
ALT: 422 U/L — ABNORMAL HIGH (ref 0–53)
AST: 207 U/L — ABNORMAL HIGH (ref 0–37)
Albumin: 3.7 g/dL (ref 3.5–5.2)
Alkaline Phosphatase: 56 U/L (ref 39–117)
BUN: 25 mg/dL — ABNORMAL HIGH (ref 6–23)
CO2: 23 mEq/L (ref 19–32)
Calcium: 9.2 mg/dL (ref 8.4–10.5)
Chloride: 105 mEq/L (ref 96–112)
Creatinine, Ser: 0.88 mg/dL (ref 0.40–1.50)
GFR: 84.06 mL/min (ref >60.00)
Glucose, Bld: 89 mg/dL (ref 70–99)
Potassium: 4 mEq/L (ref 3.5–5.1)
Sodium: 136 mEq/L (ref 135–145)
Total Bilirubin: 1.1 mg/dL (ref 0.2–1.2)
Total Protein: 6 g/dL (ref 6.0–8.3)

## 2018-10-02 LAB — HEMOGLOBIN A1C: Hgb A1c MFr Bld: 6.9 % — ABNORMAL HIGH (ref 4.6–6.5)

## 2018-10-02 LAB — PSA, MEDICARE: PSA: 5.24 ng/ml — ABNORMAL HIGH (ref 0.10–4.00)

## 2018-10-02 MED ORDER — PIOGLITAZONE HCL 30 MG PO TABS: 30.0000 mg | ORAL_TABLET | Freq: Every day | ORAL | 3 refills | Status: DC

## 2018-10-02 MED ORDER — DOXAZOSIN MESYLATE 8 MG PO TABS: 8.0000 mg | ORAL_TABLET | Freq: Every day | ORAL | 3 refills | Status: DC

## 2018-10-02 MED ORDER — METFORMIN HCL 500 MG PO TABS: ORAL_TABLET | ORAL | 3 refills | Status: DC

## 2018-10-02 NOTE — Progress Notes (Signed)
I have personally reviewed the Medicare Annual Wellness questionnaire and have noted 1. The patient's medical and social history 2. Their use of alcohol, tobacco or illicit drugs 3. Their current medications and supplements 4. The patient's functional ability including ADL's, fall risks, home safety risks and hearing or visual             impairment. 5. Diet and physical activities 6. Evidence for depression or mood disorders  The patients weight, height, BMI have been recorded in the chart and visual acuity is per eye clinic.  I have made referrals, counseling and provided education to the patient based review of the above and I have provided the pt with a written personalized care plan for preventive services.  Provider list updated- see scanned forms.  Routine anticipatory guidance given to patient.  See health maintenance. The possibility exists that previously documented standard health maintenance information may have been brought forward from a previous encounter into this note.  If needed, that same information has been updated to reflect the current situation based on today's encounter.    Flu to be done after GI evaluation. Shingles d/w pt  PNA 2013  Tetanus 2006, d/w pt.  Colonoscopy 2011, due for f/u later this year.  Prostate cancer screening up to date, done 2015, < 1 year ago  Advance directive- wife designated if patient were incapacitated.  Cognitive function addressed- see scanned forms- and if abnormal then additional documentation follows.   Hypertension:    Losartan held in the meantime. Chest pain with exertion:no Edema:no Short of breath:no Prev labs d/w pt .  Diabetes:  Using medications without difficulties:yes Hypoglycemic episodes:no Hyperglycemic episodes:no Feet problems: He has thick nails at baseline. Blood Sugars averaging: Controlled on home check  No cough, not SOB.   Elevated Cholesterol: Off statin due to recent events and LFT  elevation.  HBV positive.  HIV negative.  Has GI clinic follow-up pending.  Due for recheck LFTs.  No jaundice.  No vomiting.  No abdominal pain.  Routine cautions given to patient regarding infection risk. Constipation resolved in the meantime.  Normal BM this AM.  PMH and SH reviewed  Meds, vitals, and allergies reviewed.   ROS: Per HPI.  Unless specifically indicated otherwise in HPI, the patient denies:  General: fever. Eyes: acute vision changes ENT: sore throat Cardiovascular: chest pain Respiratory: SOB GI: vomiting GU: dysuria Musculoskeletal: acute back pain Derm: acute rash Neuro: acute motor dysfunction Psych: worsening mood Endocrine: polydipsia Heme: bleeding Allergy: hayfever  GEN: nad, alert and oriented HEENT: ncat, no scleral icterus. NECK: supple w/o LA CV: rrr. PULM: ctab, no inc wob ABD: soft, +bs EXT: no edema SKIN: no acute rash  Diabetic foot exam: Normal inspection No skin breakdown No calluses  Normal DP pulses Normal sensation to light touch and monofilament Nails thickened  Health Maintenance  Topic Date Due  . OPHTHALMOLOGY EXAM  08/06/2018  . INFLUENZA VACCINE  09/06/2018  . FOOT EXAM  09/21/2018  . HEMOGLOBIN A1C  04/04/2019  . COLONOSCOPY  06/26/2020  . TETANUS/TDAP  12/05/2026  . PNA vac Low Risk Adult  Completed

## 2018-10-02 NOTE — Patient Instructions (Signed)
Go to the lab on the way out.  We'll contact you with your lab report. Recheck CXR in about 1 month.  Restart losartan only if BP is consistently >140/>90.  Hold simvastatin for now.  Take care.  Glad to see you.  I'll await the GI notes.  Plan on recheck in about 3 months.

## 2018-10-05 ENCOUNTER — Other Ambulatory Visit: Payer: Self-pay | Admitting: Family Medicine

## 2018-10-05 DIAGNOSIS — E119 Type 2 diabetes mellitus without complications: Secondary | ICD-10-CM

## 2018-10-05 DIAGNOSIS — Z125 Encounter for screening for malignant neoplasm of prostate: Secondary | ICD-10-CM

## 2018-10-05 NOTE — Assessment & Plan Note (Signed)
Off statin due to recent events and LFT elevation.

## 2018-10-05 NOTE — Assessment & Plan Note (Signed)
Advance directive- wife designated if patient were incapacitated.  

## 2018-10-05 NOTE — Assessment & Plan Note (Signed)
See notes on follow-up labs.  No change in meds at this point.  He agrees.  Goal to avoid low sugars.  Foot care discussed with patient.

## 2018-10-05 NOTE — Assessment & Plan Note (Signed)
Reasonable to hold losartan at this point.  See notes on follow-up labs.  We can restart losartan later on if his blood pressure increases.

## 2018-10-05 NOTE — Assessment & Plan Note (Signed)
Flu to be done after GI evaluation. Shingles d/w pt  PNA 2013  Tetanus 2006, d/w pt.  Colonoscopy 2011, due for f/u later this year.  Prostate cancer screening up to date, done 2015, <1 year ago  Advance directive- wife designated if patient were incapacitated.  Cognitive function addressed- see scanned forms- and if abnormal then additional documentation follows.

## 2018-10-05 NOTE — Assessment & Plan Note (Signed)
HBV positive.  HIV negative.  Has GI clinic follow-up pending.  Due for recheck LFTs.  No jaundice.  No vomiting.  No abdominal pain.  Routine cautions given to patient regarding infection risk. Constipation resolved in the meantime.  Normal BM this AM.  I will await hepatology/GI input.

## 2018-10-05 NOTE — Assessment & Plan Note (Signed)
See above

## 2018-10-14 DIAGNOSIS — R740 Nonspecific elevation of levels of transaminase and lactic acid dehydrogenase [LDH]: Secondary | ICD-10-CM | POA: Diagnosis not present

## 2018-10-14 DIAGNOSIS — R7989 Other specified abnormal findings of blood chemistry: Secondary | ICD-10-CM | POA: Diagnosis not present

## 2018-10-15 ENCOUNTER — Encounter: Payer: Self-pay | Admitting: Family Medicine

## 2018-10-20 ENCOUNTER — Other Ambulatory Visit: Payer: Self-pay | Admitting: Family Medicine

## 2018-10-20 MED ORDER — METFORMIN HCL 500 MG PO TABS: ORAL_TABLET | ORAL | Status: DC

## 2018-10-31 ENCOUNTER — Encounter: Payer: Self-pay | Admitting: Family Medicine

## 2018-11-07 DIAGNOSIS — K625 Hemorrhage of anus and rectum: Secondary | ICD-10-CM | POA: Diagnosis not present

## 2018-11-07 DIAGNOSIS — R198 Other specified symptoms and signs involving the digestive system and abdomen: Secondary | ICD-10-CM | POA: Diagnosis not present

## 2018-11-13 DIAGNOSIS — Z1159 Encounter for screening for other viral diseases: Secondary | ICD-10-CM | POA: Diagnosis not present

## 2018-11-16 ENCOUNTER — Encounter: Payer: Self-pay | Admitting: Family Medicine

## 2018-11-18 ENCOUNTER — Encounter: Payer: Self-pay | Admitting: Family Medicine

## 2018-11-18 ENCOUNTER — Other Ambulatory Visit: Payer: Self-pay

## 2018-11-18 NOTE — Patient Outreach (Signed)
[facility]) Care Management  11/18/2018  Jacob Ponce [birth year] [id number]   Medication Adherence call to [name] Compliant Voice message left with a call back number.[name] is showing past due on Simvastatin 40 mg under [facility].   [facility] Management Direct Dial [phone]  Fax [phone] [name]

## 2018-11-19 ENCOUNTER — Other Ambulatory Visit: Payer: Self-pay | Admitting: Family Medicine

## 2018-11-19 MED ORDER — PIOGLITAZONE HCL 30 MG PO TABS: 15.0000 mg | ORAL_TABLET | Freq: Every day | ORAL | Status: DC

## 2018-12-08 ENCOUNTER — Other Ambulatory Visit: Payer: Self-pay

## 2018-12-08 NOTE — Patient Outreach (Signed)
[facility]) Care Management  12/08/2018  DEUNDRA BARD April 11, 1940 [id number]   Medication Adherence call to Mr. [place] Compliant Voice message left with a call back number. Mr. Hass Is showing past due on Simvastatin 40 mg and Losartan 25 mg under [facility].   [facility] Management Direct Dial [phone]  Fax [phone] [email or web address]

## 2018-12-11 DIAGNOSIS — R7989 Other specified abnormal findings of blood chemistry: Secondary | ICD-10-CM | POA: Diagnosis not present

## 2018-12-30 ENCOUNTER — Encounter: Payer: Self-pay | Admitting: Family Medicine

## 2019-01-06 ENCOUNTER — Ambulatory Visit (INDEPENDENT_AMBULATORY_CARE_PROVIDER_SITE_OTHER)
Admission: RE | Admit: 2019-01-06 | Discharge: 2019-01-06 | Disposition: A | Discharge status: Home / Self Care | Payer: Medicare Other | Source: Ambulatory Visit | Attending: Family Medicine | Admitting: Family Medicine

## 2019-01-06 ENCOUNTER — Other Ambulatory Visit (INDEPENDENT_AMBULATORY_CARE_PROVIDER_SITE_OTHER): Payer: Medicare Other

## 2019-01-06 DIAGNOSIS — E119 Type 2 diabetes mellitus without complications: Secondary | ICD-10-CM

## 2019-01-06 DIAGNOSIS — J189 Pneumonia, unspecified organism: Secondary | ICD-10-CM

## 2019-01-06 DIAGNOSIS — Z125 Encounter for screening for malignant neoplasm of prostate: Secondary | ICD-10-CM

## 2019-01-06 LAB — CBC WITH DIFFERENTIAL/PLATELET
Basophils Absolute: 0.1 10*3/uL (ref 0.0–0.1)
Basophils Relative: 1.2 % (ref 0.0–3.0)
Eosinophils Absolute: 0.2 10*3/uL (ref 0.0–0.7)
Eosinophils Relative: 2.3 % (ref 0.0–5.0)
HCT: 45.3 % (ref 39.0–52.0)
Hemoglobin: 15.4 g/dL (ref 13.0–17.0)
Lymphocytes Relative: 29 % (ref 12.0–46.0)
Lymphs Abs: 2 10*3/uL (ref 0.7–4.0)
MCHC: 34 g/dL (ref 30.0–36.0)
MCV: 92.3 fl (ref 78.0–100.0)
Monocytes Absolute: 0.7 10*3/uL (ref 0.1–1.0)
Monocytes Relative: 10.5 % (ref 3.0–12.0)
Neutro Abs: 3.9 10*3/uL (ref 1.4–7.7)
Neutrophils Relative %: 57 % (ref 43.0–77.0)
Platelets: 326 10*3/uL (ref 150.0–400.0)
RBC: 4.9 Mil/uL (ref 4.22–5.81)
RDW: 14.6 % (ref 11.5–15.5)
WBC: 6.8 10*3/uL (ref 4.0–10.5)

## 2019-01-06 LAB — COMPREHENSIVE METABOLIC PANEL
ALT: 95 U/L — ABNORMAL HIGH (ref 0–53)
AST: 66 U/L — ABNORMAL HIGH (ref 0–37)
Albumin: 3.8 g/dL (ref 3.5–5.2)
Alkaline Phosphatase: 56 U/L (ref 39–117)
BUN: 21 mg/dL (ref 6–23)
CO2: 25 mEq/L (ref 19–32)
Calcium: 9.7 mg/dL (ref 8.4–10.5)
Chloride: 103 mEq/L (ref 96–112)
Creatinine, Ser: 0.89 mg/dL (ref 0.40–1.50)
GFR: 82.92 mL/min (ref >60.00)
Glucose, Bld: 135 mg/dL — ABNORMAL HIGH (ref 70–99)
Potassium: 4.2 mEq/L (ref 3.5–5.1)
Sodium: 136 mEq/L (ref 135–145)
Total Bilirubin: 0.7 mg/dL (ref 0.2–1.2)
Total Protein: 6.1 g/dL (ref 6.0–8.3)

## 2019-01-06 LAB — HEMOGLOBIN A1C: Hgb A1c MFr Bld: 5.8 % (ref 4.6–6.5)

## 2019-01-06 LAB — PSA, MEDICARE: PSA: 4.29 ng/ml — ABNORMAL HIGH (ref 0.10–4.00)

## 2019-01-07 ENCOUNTER — Other Ambulatory Visit: Payer: Self-pay

## 2019-01-07 NOTE — Patient Outreach (Signed)
[facility]) Care Management  01/07/2019  Jacob Ponce 07/24/1940 [id number]   Medication Adherence call to Jacob Ponce Compliant Voice message left with a call back number. Jacob Ponce is showing past due on Losartan 25 mg under [facility].   [facility] Management Direct Dial [phone]  Fax [phone] [email or web address]

## 2019-01-09 ENCOUNTER — Telehealth: Payer: Self-pay | Admitting: *Deleted

## 2019-01-09 NOTE — Telephone Encounter (Signed)
Dr. Damita Dunnings asked on the lab results comments to see how the patient's BS readings are doing at home.  Patient says his morning readings are 90 - 105 but it goes up through the day.  Evening readings are 130-140.

## 2019-01-09 NOTE — Telephone Encounter (Signed)
Patient advised.

## 2019-01-09 NOTE — Telephone Encounter (Signed)
Then I would continue as is.  If he has troubles with low sugars, then let me know.  Likely reasonable to plan on recheck in about 3 months with labs at the office visit.  Thanks.

## 2019-01-15 ENCOUNTER — Encounter: Payer: Self-pay | Admitting: Family Medicine

## 2019-01-15 NOTE — Telephone Encounter (Signed)
I don't know if Dr. Cristina Gong is with [facility] but if he isn't, we are not supposed to draw labs for other MD's, just a reminder.

## 2019-01-28 ENCOUNTER — Other Ambulatory Visit: Payer: Self-pay | Admitting: Family Medicine

## 2019-01-28 DIAGNOSIS — B1910 Unspecified viral hepatitis B without hepatic coma: Secondary | ICD-10-CM

## 2019-01-28 DIAGNOSIS — E119 Type 2 diabetes mellitus without complications: Secondary | ICD-10-CM

## 2019-01-28 NOTE — Telephone Encounter (Signed)
Dr. Cristina Gong is not with the [facility] but I still want to see these labs when I see the patient in the spring.  I put in the orders.  I will update patient via MyChart message.

## 2019-02-03 ENCOUNTER — Encounter: Payer: Self-pay | Admitting: Family Medicine

## 2019-02-13 ENCOUNTER — Encounter: Payer: Self-pay | Admitting: Family Medicine

## 2019-03-03 ENCOUNTER — Ambulatory Visit: Payer: Medicare Other

## 2019-03-04 ENCOUNTER — Ambulatory Visit: Payer: Medicare Other

## 2019-03-09 ENCOUNTER — Encounter: Payer: Self-pay | Admitting: Family Medicine

## 2019-03-12 ENCOUNTER — Ambulatory Visit: Payer: Medicare Other | Attending: Internal Medicine

## 2019-03-12 DIAGNOSIS — Z23 Encounter for immunization: Secondary | ICD-10-CM | POA: Insufficient documentation

## 2019-04-07 ENCOUNTER — Ambulatory Visit: Payer: Medicare Other | Attending: Internal Medicine

## 2019-04-07 DIAGNOSIS — Z23 Encounter for immunization: Secondary | ICD-10-CM | POA: Insufficient documentation

## 2019-04-07 NOTE — Progress Notes (Signed)
   Covid-19 Vaccination Clinic  Name:  DAMARRION MIMBS    MRN: [id number] DOB: [birth year]  04/07/2019  Mr. Frisbie was observed post Covid-19 immunization for 15 minutes without incident. He was provided with Vaccine Information Sheet and instruction to access the V-Safe system.   Mr. Turney was instructed to call 911 with any severe reactions post vaccine: Marland Kitchen Difficulty breathing  . Swelling of face and throat  . A fast heartbeat  . A bad rash all over body  . Dizziness and weakness   Immunizations Administered    Name Date Dose VIS Date Route   Pfizer COVID-19 Vaccine 04/07/2019  8:35 AM 0.3 mL 01/16/2019 Intramuscular   Manufacturer: [employer], [employer]   Lot: [id number]   NDC: [address]-6

## 2019-04-14 ENCOUNTER — Ambulatory Visit (INDEPENDENT_AMBULATORY_CARE_PROVIDER_SITE_OTHER): Payer: Medicare Other | Admitting: Family Medicine

## 2019-04-14 ENCOUNTER — Encounter: Payer: Self-pay | Admitting: Family Medicine

## 2019-04-14 ENCOUNTER — Other Ambulatory Visit: Payer: Self-pay

## 2019-04-14 VITALS — BP 128/70 | HR 74 | Temp 96.9°F | Ht 70.0 in | Wt 197.2 lb

## 2019-04-14 DIAGNOSIS — B1910 Unspecified viral hepatitis B without hepatic coma: Secondary | ICD-10-CM | POA: Diagnosis not present

## 2019-04-14 DIAGNOSIS — E119 Type 2 diabetes mellitus without complications: Secondary | ICD-10-CM | POA: Diagnosis not present

## 2019-04-14 LAB — COMPREHENSIVE METABOLIC PANEL
ALT: 75 U/L — ABNORMAL HIGH (ref 0–53)
AST: 48 U/L — ABNORMAL HIGH (ref 0–37)
Albumin: 3.9 g/dL (ref 3.5–5.2)
Alkaline Phosphatase: 56 U/L (ref 39–117)
BUN: 26 mg/dL — ABNORMAL HIGH (ref 6–23)
CO2: 25 mEq/L (ref 19–32)
Calcium: 9.7 mg/dL (ref 8.4–10.5)
Chloride: 105 mEq/L (ref 96–112)
Creatinine, Ser: 0.97 mg/dL (ref 0.40–1.50)
GFR: 75.02 mL/min (ref >60.00)
Glucose, Bld: 124 mg/dL — ABNORMAL HIGH (ref 70–99)
Potassium: 4.4 mEq/L (ref 3.5–5.1)
Sodium: 136 mEq/L (ref 135–145)
Total Bilirubin: 0.6 mg/dL (ref 0.2–1.2)
Total Protein: 6.3 g/dL (ref 6.0–8.3)

## 2019-04-14 LAB — CBC WITH DIFFERENTIAL/PLATELET
Basophils Absolute: 0.1 10*3/uL (ref 0.0–0.1)
Basophils Relative: 0.9 % (ref 0.0–3.0)
Eosinophils Absolute: 0.1 10*3/uL (ref 0.0–0.7)
Eosinophils Relative: 1.5 % (ref 0.0–5.0)
HCT: 40.5 % (ref 39.0–52.0)
Hemoglobin: 13.8 g/dL (ref 13.0–17.0)
Lymphocytes Relative: 24.6 % (ref 12.0–46.0)
Lymphs Abs: 1.7 10*3/uL (ref 0.7–4.0)
MCHC: 34 g/dL (ref 30.0–36.0)
MCV: 91.2 fl (ref 78.0–100.0)
Monocytes Absolute: 0.7 10*3/uL (ref 0.1–1.0)
Monocytes Relative: 9.8 % (ref 3.0–12.0)
Neutro Abs: 4.3 10*3/uL (ref 1.4–7.7)
Neutrophils Relative %: 63.2 % (ref 43.0–77.0)
Platelets: 314 10*3/uL (ref 150.0–400.0)
RBC: 4.44 Mil/uL (ref 4.22–5.81)
RDW: 14 % (ref 11.5–15.5)
WBC: 6.9 10*3/uL (ref 4.0–10.5)

## 2019-04-14 LAB — POCT GLYCOSYLATED HEMOGLOBIN (HGB A1C): Hemoglobin A1C: 5.9 % — AB (ref 4.0–5.6)

## 2019-04-14 MED ORDER — METFORMIN HCL 500 MG PO TABS: ORAL_TABLET | ORAL | Status: DC

## 2019-04-14 NOTE — Progress Notes (Signed)
This visit occurred during the SARS-CoV-2 public health emergency.  Safety protocols were in place, including screening questions prior to the visit, additional usage of staff PPE, and extensive cleaning of exam room while observing appropriate contact time as indicated for disinfecting solutions.  Diabetes:  Using medications without difficulties: yes Hypoglycemic episodes: no Hyperglycemic episodes: no Feet problems: no Blood Sugars averaging: ~120 recently in the AM, up to 140s at night.  eye exam within last year: done yearly, he'll go this summer, d/w pt.   A1c 5.9.   D/w pt at OV.    HBV f/u with GI pending, Dr. Matthias Hughs, for next week.  No abd pain, no vomiting, no diarrhea.  Due for f/u labs.  He had some hemorrhoidal bleeding recently.  He is using prep H with relief.    Some post nasal gtt w/o fever noted.   He had covid vaccine, pfizer.    Meds, vitals, and allergies reviewed.  ROS: Per HPI unless specifically indicated in ROS section   GEN: nad, alert and oriented HEENT: ncat NECK: supple w/o LA CV: rrr. PULM: ctab, no inc wob ABD: soft, +bs EXT: no edema SKIN: no acute rash  Diabetic foot exam: Normal inspection No skin breakdown No calluses  Normal DP pulses Normal sensation to light touch and monofilament Nails normal

## 2019-04-14 NOTE — Patient Instructions (Signed)
Go by the lab on the way out.  We'll update the GI clinic.  Take care.  Glad to see you.

## 2019-04-15 NOTE — Assessment & Plan Note (Signed)
A1c 5.9.   D/w pt at OV.   No change in meds at this point.  See notes on follow-up labs otherwise.

## 2019-04-15 NOTE — Assessment & Plan Note (Signed)
HBV f/u with GI pending, Dr. Matthias Hughs, for next week.  No abd pain, no vomiting, no diarrhea.  Due for f/u labs.  He had some hemorrhoidal bleeding recently.  He is using prep H with relief.  See notes on labs.

## 2019-04-23 LAB — HBSAG QUANTITATIVE, MONITOR

## 2019-04-23 LAB — HEP B SURFACE ANTIGEN QUANT: Hep B Surface Antigen Quant: 13832 IU/mL

## 2019-05-12 ENCOUNTER — Other Ambulatory Visit: Payer: Self-pay | Admitting: Family Medicine

## 2019-05-19 ENCOUNTER — Other Ambulatory Visit: Payer: Self-pay | Admitting: *Deleted

## 2019-05-19 MED ORDER — METFORMIN HCL 500 MG PO TABS: ORAL_TABLET | ORAL | 3 refills | Status: DC

## 2019-05-19 MED ORDER — ONETOUCH ULTRASOFT LANCETS MISC: 3 refills | Status: DC

## 2019-05-19 MED ORDER — PIOGLITAZONE HCL 30 MG PO TABS: 15.0000 mg | ORAL_TABLET | Freq: Every day | ORAL | 3 refills | Status: DC

## 2019-05-19 MED ORDER — DOXAZOSIN MESYLATE 8 MG PO TABS: 8.0000 mg | ORAL_TABLET | Freq: Every day | ORAL | 3 refills | Status: DC

## 2019-05-19 MED ORDER — ONETOUCH ULTRA VI STRP: ORAL_STRIP | 3 refills | Status: DC

## 2019-05-27 ENCOUNTER — Encounter: Payer: Self-pay | Admitting: Infectious Disease

## 2019-05-27 DIAGNOSIS — R198 Other specified symptoms and signs involving the digestive system and abdomen: Secondary | ICD-10-CM | POA: Diagnosis not present

## 2019-05-27 LAB — HM HEPATITIS C SCREENING LAB: HM Hepatitis Screen: NEGATIVE

## 2019-06-05 IMAGING — DX DG TIBIA/FIBULA 2V*L*
4 series · 4 of 4 positions shown · non-contrast
Comparison: None.

CLINICAL DATA: Left leg pain after injury.

EXAM:
LEFT TIBIA AND FIBULA - 2 VIEW

[tibia ap (1 of 2)]
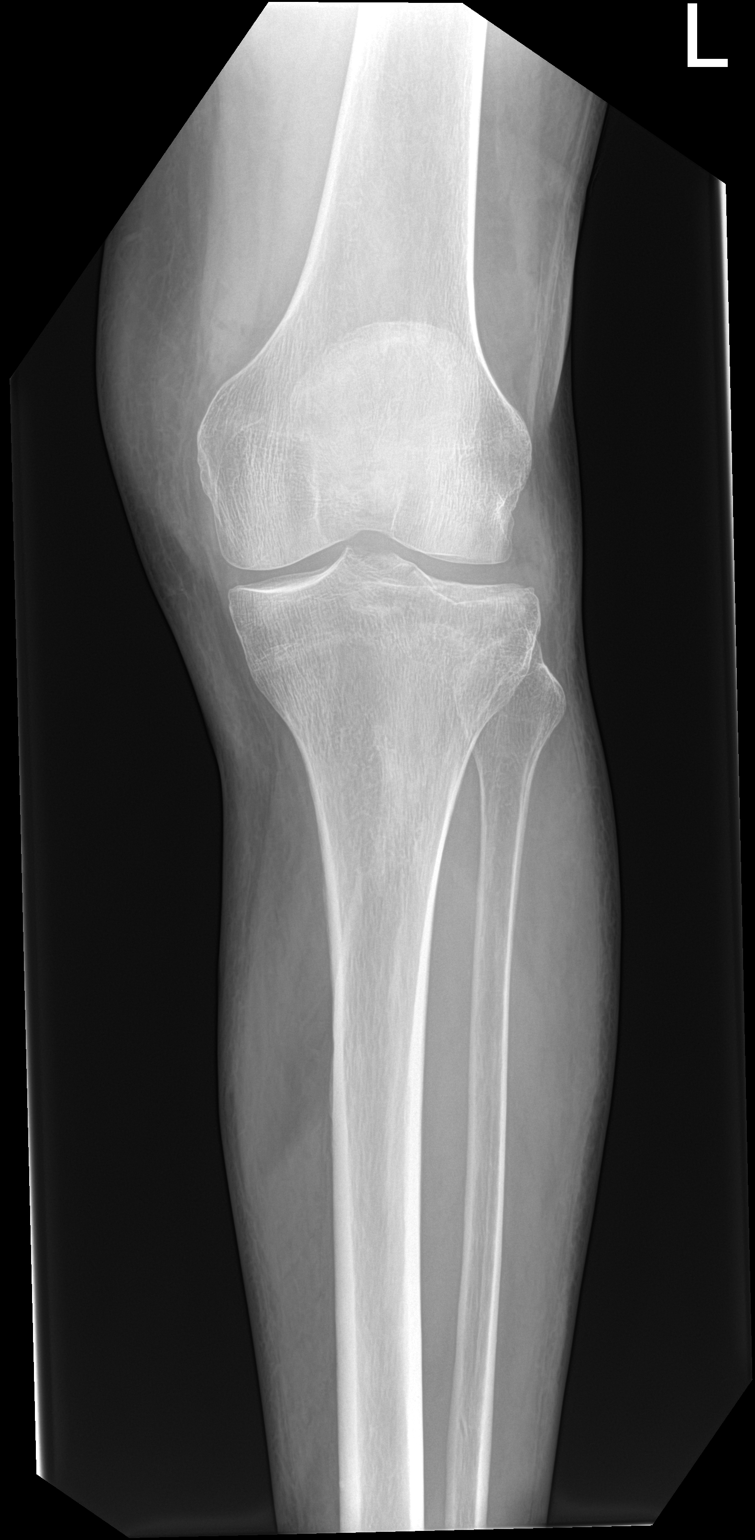

[tibia ap (2 of 2)]
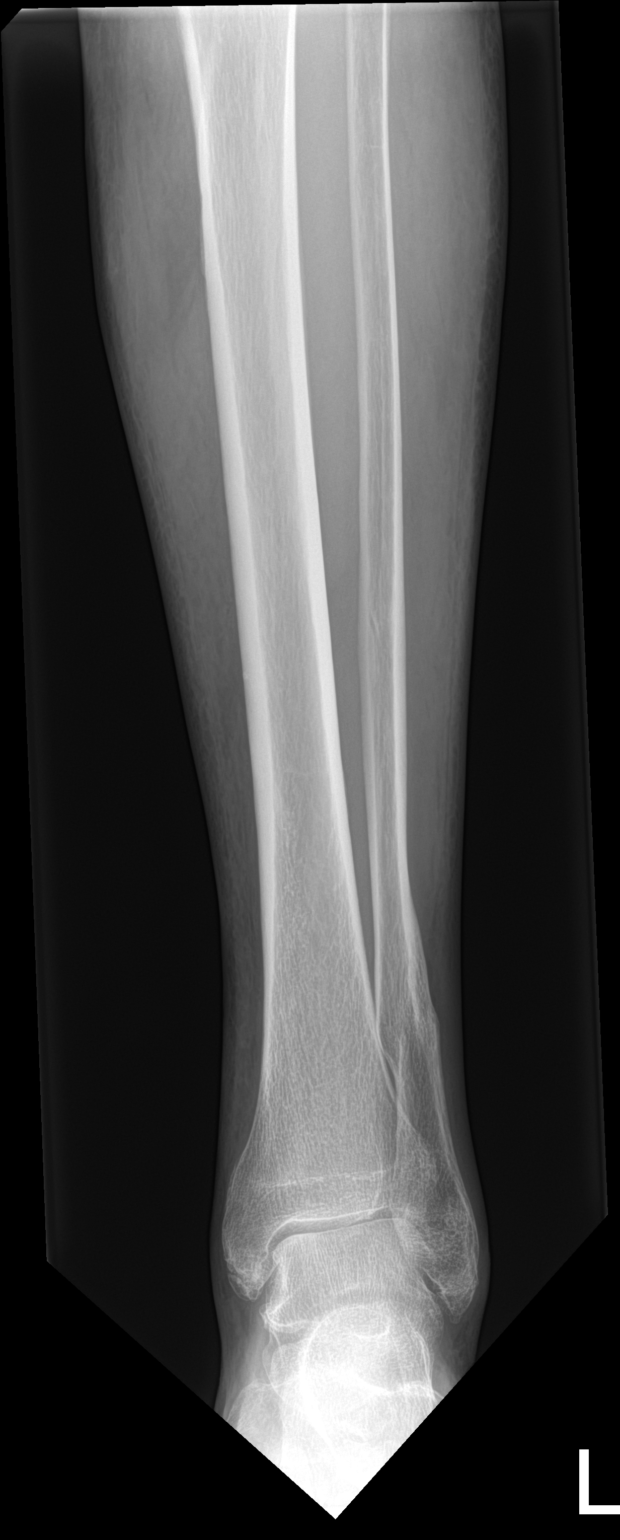

[tibia lat (1 of 2)]
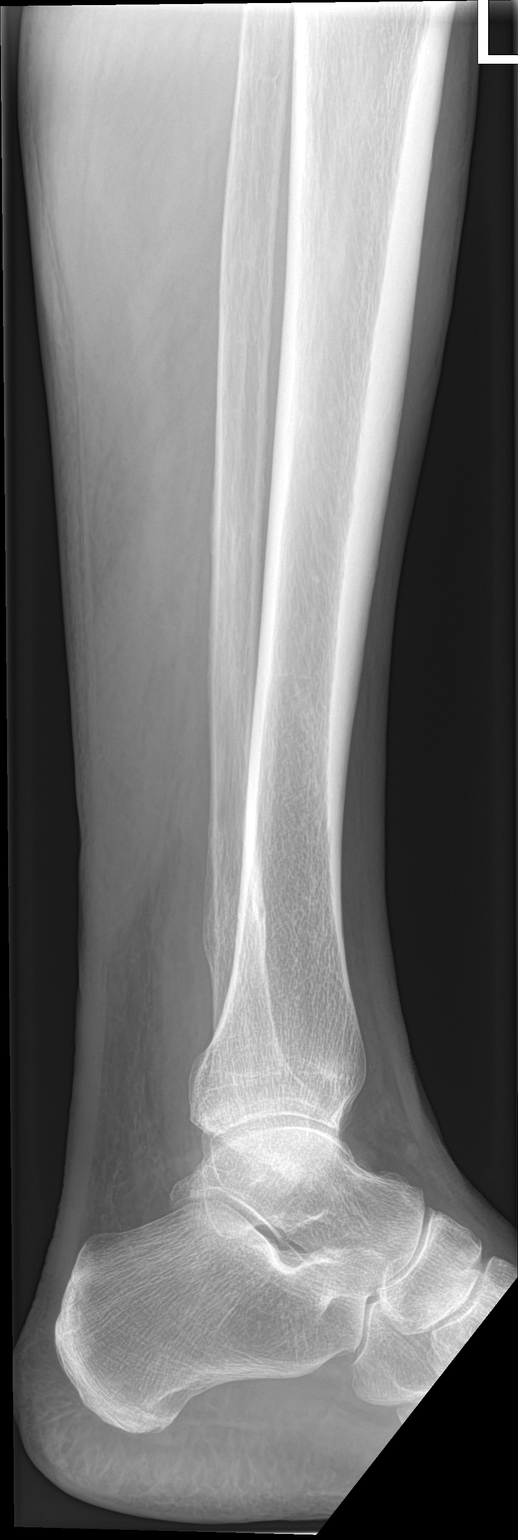

[tibia lat (2 of 2)]
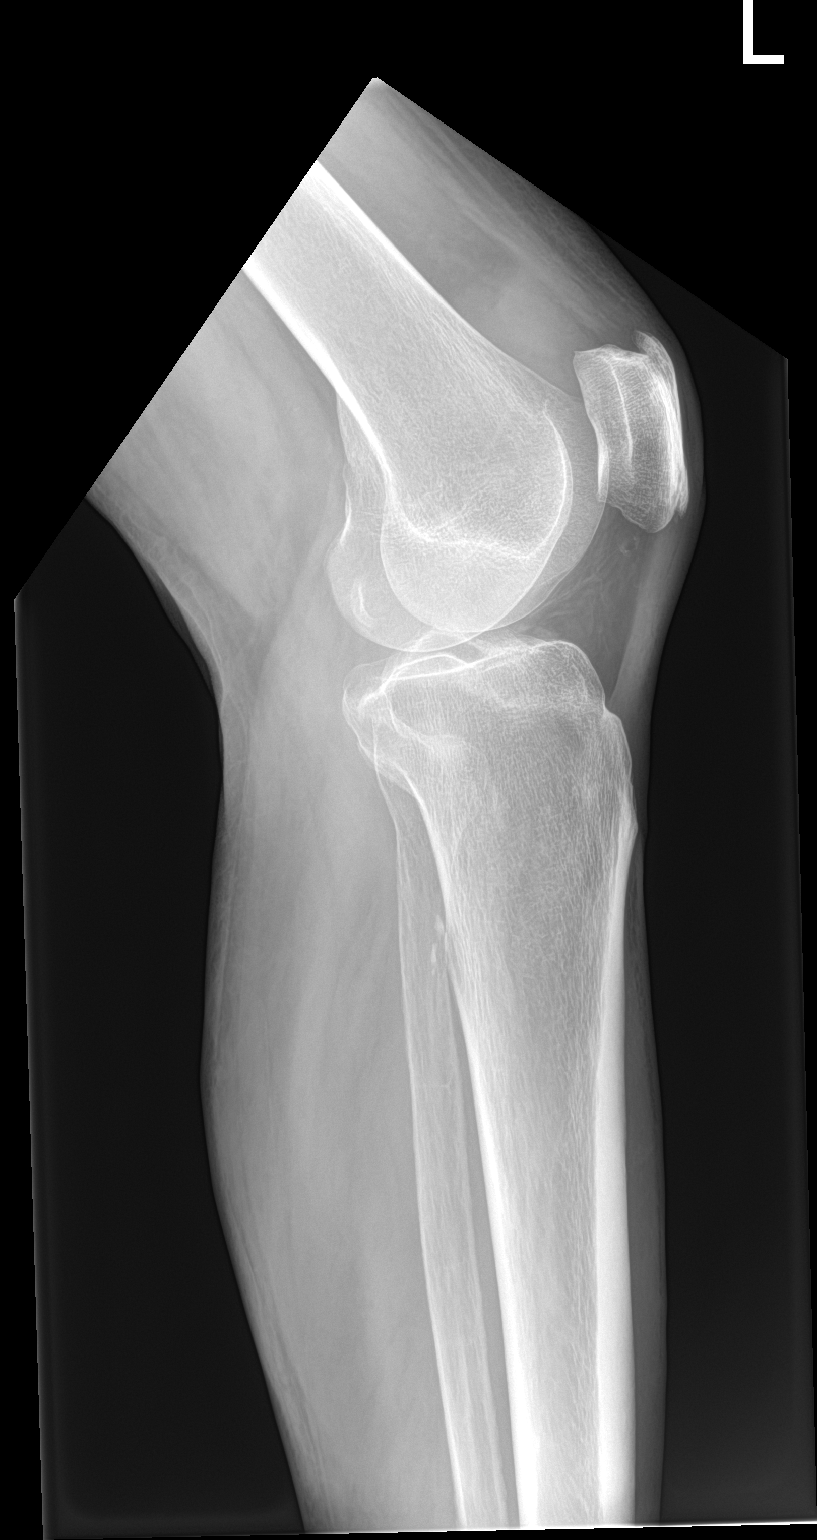

[4 of 4 positions shown; findings below may reference images not displayed]

FINDINGS: Old healed distal left fibular fracture is noted. No acute fracture
or dislocation is noted. No soft tissue abnormality is noted.
IMPRESSION: No acute abnormality seen in the left tibia or fibula.

## 2019-06-29 ENCOUNTER — Ambulatory Visit: Payer: Medicare Other | Admitting: Infectious Disease

## 2019-06-29 ENCOUNTER — Other Ambulatory Visit: Payer: Self-pay

## 2019-06-29 ENCOUNTER — Encounter: Payer: Self-pay | Admitting: Infectious Disease

## 2019-06-29 VITALS — BP 117/72 | HR 77 | Temp 98.4°F | Wt 190.0 lb

## 2019-06-29 DIAGNOSIS — E119 Type 2 diabetes mellitus without complications: Secondary | ICD-10-CM | POA: Diagnosis not present

## 2019-06-29 DIAGNOSIS — B181 Chronic viral hepatitis B without delta-agent: Secondary | ICD-10-CM | POA: Diagnosis not present

## 2019-06-29 NOTE — Progress Notes (Signed)
Subjective:  Reason for infectious disease consult: Chronic Hepatitis B infection  Requesting Physician: Dr. Cristina Gong    Patient ID: Jacob Ponce, male    DOB: October 22, 1940, 79 y.o.   MRN: [id number]  HPI  Jacob Ponce is a 79 year old man with DM, HTN, hyperlipidemia who contracted Hepatitis B last summer. He had had unprotected anal intercourse with another man in July and went for testing at the [facility] where he tested negative.   He then was hospitalized with pneumonia and found to have an acute elevation of his transaminases in August 2020 with his ALT being 511 and AST 165 and his HIV test was negative at that time.  Since then he has been seen by Dr. Cristina Gong who has performed serologies on the patient as recently as April 2021.  At that time his hepatitis B surface antigen remained positive his hepatitis B E antigen was positive his hepatitis B E antigen antibody was negative his surface antibody qualitative was negative and his hepatitis B core antibody was positive on IgM testing in total.  Tested negative for hepatitis C.  I did not see test for hepatitis A or hepatitis B B delta agent or hepatitis B viral load.  His transaminases appear to have normalized in April.  He very much does not want his wife knowing about the extra marital sex. He has in the past been interested in PrEP but it would cost him $1300 a month ehe believes and he is concerned re side effects.  Past Medical History:  Diagnosis Date  . BPH (benign prostatic hyperplasia)   . Diabetes mellitus    Type II, controlled  . Fracture 2011   Right proximal humerus, resolved w/o deficit  . HBV (hepatitis B virus) infection   . History of cardiac cath 1999   and Tilt Table Test, both negative  . Hyperlipidemia   . Hypertension   . Impotence of organic origin   . Special screening for malignant neoplasms of other sites     Past Surgical History:  Procedure Laterality Date  . APPENDECTOMY    . TIBIA FRACTURE SURGERY       Left, resolved without deficit  . TONSILLECTOMY      Family History  Problem Relation Age of Onset  . Cancer Father        colon  . Seizures Father        after head injury  . Colon cancer Father   . Colon cancer Mother   . Arthritis Brother   . Prostate cancer Neg Hx       Social History   Socioeconomic History  . Marital status: Married    Spouse name: Not on file  . Number of children: 4  . Years of education: Not on file  . Highest education level: Not on file  Occupational History  . Occupation: Chief Financial Officer at [employer]: [employer]  . Occupation: Insurance underwriter  Tobacco Use  . Smoking status: Former Smoker    Types: Cigarettes    Quit date: 02/05/1985    Years since quitting: 34.4  . Smokeless tobacco: Never Used  . Tobacco comment: Smoked from 1966 to 1987  Substance and Sexual Activity  . Alcohol use: No    Alcohol/week: 0.0 standard drinks  . Drug use: No  . Sexual activity: Never  Other Topics Concern  . Not on file  Social History Narrative   Undergrad and Masters at [employer].  Second marriage since 1987   4 children, 3 step-children   Flew small aircraft- retired from flying 2019   Working 3-4 days a week as of 2021   Social Determinants of Corporate investment banker Strain:   . Difficulty of Paying Living Expenses:   Food Insecurity:   . Worried About Programme researcher, broadcasting/film/video in the Last Year:   . Barista in the Last Year:   Transportation Needs:   . Freight forwarder (Medical):   Marland Kitchen Lack of Transportation (Non-Medical):   Physical Activity:   . Days of Exercise per Week:   . Minutes of Exercise per Session:   Stress:   . Feeling of Stress :   Social Connections:   . Frequency of Communication with Friends and Family:   . Frequency of Social Gatherings with Friends and Family:   . Attends Religious Services:   . Active Member of Clubs or Organizations:   . Attends Banker Meetings:   Marland Kitchen Marital Status:     No  Known Allergies   Current Outpatient Medications:  .  aspirin 81 MG tablet, Take 81 mg by mouth daily.  , Disp: , Rfl:  .  doxazosin (CARDURA) 8 MG tablet, Take 1 tablet (8 mg total) by mouth at bedtime., Disp: 90 tablet, Rfl: 3 .  glucose blood (ONETOUCH ULTRA) test strip, USE TO CHECK BLOOD SUGAR DAILY. DX: E11.9, Disp: 100 strip, Rfl: 3 .  Lancets (ONETOUCH ULTRASOFT) lancets, Use to check blood sugar daily.  Dx: E11.9, Disp: 100 each, Rfl: 3 .  loratadine (CLARITIN) 10 MG tablet, Take 10 mg by mouth daily as needed for allergies., Disp: , Rfl:  .  metFORMIN (GLUCOPHAGE) 500 MG tablet, TAKE 1 TABLET TWICE DAILY WITH A MEAL., Disp: 180 tablet, Rfl: 3 .  pioglitazone (ACTOS) 30 MG tablet, Take 0.5 tablets (15 mg total) by mouth daily., Disp: 45 tablet, Rfl: 3 .  losartan (COZAAR) 50 MG tablet, HELD (Patient not taking: Reported on 04/14/2019), Disp: , Rfl:  .  simvastatin (ZOCOR) 40 MG tablet, HELD (Patient not taking: Reported on 04/14/2019), Disp: , Rfl:    Review of Systems  Constitutional: Negative for activity change, appetite change, chills, diaphoresis, fatigue, fever and unexpected weight change.  HENT: Negative for congestion, rhinorrhea, sinus pressure, sneezing, sore throat and trouble swallowing.   Eyes: Negative for photophobia and visual disturbance.  Respiratory: Negative for cough, chest tightness, shortness of breath, wheezing and stridor.   Cardiovascular: Negative for chest pain, palpitations and leg swelling.  Gastrointestinal: Negative for abdominal distention, abdominal pain, anal bleeding, blood in stool, constipation, diarrhea, nausea and vomiting.  Genitourinary: Negative for difficulty urinating, dysuria, flank pain and hematuria.  Musculoskeletal: Negative for arthralgias, back pain, gait problem, joint swelling and myalgias.  Skin: Negative for color change, pallor, rash and wound.  Neurological: Negative for dizziness, tremors, weakness and light-headedness.    Hematological: Negative for adenopathy. Does not bruise/bleed easily.  Psychiatric/Behavioral: Negative for agitation, behavioral problems, confusion, decreased concentration, dysphoric mood and sleep disturbance.       Objective:   Physical Exam Constitutional:      General: He is not in acute distress.    Appearance: He is not diaphoretic.  HENT:     Head: Normocephalic and atraumatic.     Right Ear: External ear normal.     Left Ear: External ear normal.     Nose: Nose normal.     Mouth/Throat:  Pharynx: No oropharyngeal exudate.  Eyes:     General: No scleral icterus.    Extraocular Movements: Extraocular movements intact.     Conjunctiva/sclera: Conjunctivae normal.     Pupils: Pupils are equal, round, and reactive to light.  Cardiovascular:     Rate and Rhythm: Normal rate and regular rhythm.  Pulmonary:     Effort: Pulmonary effort is normal. No respiratory distress.     Breath sounds: No wheezing.  Abdominal:     General: Bowel sounds are normal. There is no distension.     Palpations: Abdomen is soft.     Tenderness: There is no rebound.  Musculoskeletal:        General: No tenderness. Normal range of motion.     Cervical back: Normal range of motion and neck supple.  Lymphadenopathy:     Cervical: No cervical adenopathy.  Skin:    General: Skin is warm and dry.     Coloration: Skin is not pale.     Findings: No erythema or rash.  Neurological:     General: No focal deficit present.     Mental Status: He is alert and oriented to person, place, and time.     Coordination: Coordination normal.  Psychiatric:        Mood and Affect: Mood normal.        Behavior: Behavior normal.        Thought Content: Thought content normal.        Judgment: Judgment normal.           Assessment & Plan:   Chronic hepatitis B: he is E ag positive, E ab negative.  His Eminase is appear to have normalized we will check a hep B DNA today check a hepatitis B delta agent  and check elastography.  If we ended up giving him preexposure prophylaxis we would start treating him for his hepatitis B as part of that.  However if we do not pursue that I do not clearly see an indication yet but may be the hep B DNAand other tests may give a  reason to initiate treatment  Risk for HIV: he would benefit from PrEP and given Hep B + I would want to go with Descovy. I will ask my pharmacist to look into cost for him

## 2019-07-01 ENCOUNTER — Telehealth: Payer: Self-pay | Admitting: Pharmacy Technician

## 2019-07-01 NOTE — Telephone Encounter (Signed)
[facility] Patient Advocate Encounter  Tried calling the patient twice today.  Need to let him know that he is approved for copay assistance for Descovy through PA[employer].  No voicemail set up, no answer.  He does not want his wife to know.  Below are the pharmacy numbers needed to get the assistance.  We want to know which pharmacy he wants Descovy sent.     Will continue to follow.  Netty Starring. Dimas Aguas CPhT Specialty Pharmacy Patient [facility] Phone: [phone] Fax:  [phone]

## 2019-07-01 NOTE — Telephone Encounter (Signed)
[facility] Patient Advocate Encounter   I was successful in securing patient a $7500 grant from [employer] ([employer]) to provide copayment coverage for Descovy.  This will make the out of pocket cost $0.     I have spoken with the patient.    The billing information will be shared with the patient's pharmacy   Patient knows to call the office with questions or concerns.  Netty Starring. Dimas Aguas CPhT Specialty Pharmacy Patient [facility] for Infectious Disease Phone: [phone] Fax:  [phone]

## 2019-07-03 ENCOUNTER — Other Ambulatory Visit: Payer: Self-pay | Admitting: Pharmacist

## 2019-07-03 DIAGNOSIS — Z7251 High risk heterosexual behavior: Secondary | ICD-10-CM

## 2019-07-03 DIAGNOSIS — B181 Chronic viral hepatitis B without delta-agent: Secondary | ICD-10-CM

## 2019-07-03 MED ORDER — DESCOVY 200-25 MG PO TABS: 1.0000 | ORAL_TABLET | Freq: Every day | ORAL | 2 refills | Status: DC

## 2019-07-03 MED FILL — DESCOVY 200-25 MG TABS: 200-25 | 30 days supply | Qty: 30 | Fill #0

## 2019-07-07 NOTE — Progress Notes (Signed)
We went ahead and got him coverage for Descovy and started him on that.. is that ok?

## 2019-07-10 LAB — COMPLETE METABOLIC PANEL WITH GFR
AG Ratio: 1.4 (calc) (ref 1.0–2.5)
ALT: 50 U/L — ABNORMAL HIGH (ref 9–46)
AST: 37 U/L — ABNORMAL HIGH (ref 10–35)
Albumin: 3.7 g/dL (ref 3.6–5.1)
Alkaline phosphatase (APISO): 52 U/L (ref 35–144)
BUN/Creatinine Ratio: 26 (calc) — ABNORMAL HIGH (ref 6–22)
BUN: 27 mg/dL — ABNORMAL HIGH (ref 7–25)
CO2: 21 mmol/L (ref 20–32)
Calcium: 9.8 mg/dL (ref 8.6–10.3)
Chloride: 106 mmol/L (ref 98–110)
Creat: 1.02 mg/dL (ref 0.70–1.18)
GFR, Est African American: 82 mL/min/{1.73_m2} (ref >=60)
GFR, Est Non African American: 71 mL/min/{1.73_m2} (ref >=60)
Globulin: 2.6 g/dL (calc) (ref 1.9–3.7)
Glucose, Bld: 161 mg/dL — ABNORMAL HIGH (ref 65–99)
Potassium: 4.6 mmol/L (ref 3.5–5.3)
Sodium: 137 mmol/L (ref 135–146)
Total Bilirubin: 0.6 mg/dL (ref 0.2–1.2)
Total Protein: 6.3 g/dL (ref 6.1–8.1)

## 2019-07-10 LAB — HEPATITIS A ANTIBODY, TOTAL: Hepatitis A AB,Total: NONREACTIVE

## 2019-07-10 LAB — HEPATITIS DELTA VIRUS ANTIGEN

## 2019-07-10 LAB — HEPATITIS B DNA, ULTRAQUANTITATIVE, PCR
Hepatitis B DNA (Calc): 8.72 Log IU/mL — ABNORMAL HIGH
Hepatitis B DNA: 530000000 IU/mL — ABNORMAL HIGH

## 2019-07-10 LAB — HIV ANTIBODY (ROUTINE TESTING W REFLEX): HIV 1&2 Ab, 4th Generation: NONREACTIVE

## 2019-07-10 LAB — HEPATITIS DELTA ANTIBODY: Hepatitis D Ab, Total: NEGATIVE

## 2019-07-15 ENCOUNTER — Ambulatory Visit
Admission: RE | Admit: 2019-07-15 | Discharge: 2019-07-15 | Disposition: A | Discharge status: Home / Self Care | Payer: Medicare Other | Source: Ambulatory Visit | Attending: Infectious Disease | Admitting: Infectious Disease

## 2019-07-15 DIAGNOSIS — B181 Chronic viral hepatitis B without delta-agent: Secondary | ICD-10-CM

## 2019-07-21 ENCOUNTER — Other Ambulatory Visit: Payer: Self-pay | Admitting: Pharmacist

## 2019-07-21 DIAGNOSIS — Z7251 High risk heterosexual behavior: Secondary | ICD-10-CM

## 2019-07-21 DIAGNOSIS — B181 Chronic viral hepatitis B without delta-agent: Secondary | ICD-10-CM

## 2019-07-21 MED ORDER — DESCOVY 200-25 MG PO TABS: 1.0000 | ORAL_TABLET | Freq: Every day | ORAL | 2 refills | Status: DC

## 2019-07-21 NOTE — Progress Notes (Unsigned)
Descovy sent to [facility]

## 2019-07-23 DIAGNOSIS — H35363 Drusen (degenerative) of macula, bilateral: Secondary | ICD-10-CM | POA: Diagnosis not present

## 2019-07-23 DIAGNOSIS — E119 Type 2 diabetes mellitus without complications: Secondary | ICD-10-CM | POA: Diagnosis not present

## 2019-07-23 DIAGNOSIS — H353132 Nonexudative age-related macular degeneration, bilateral, intermediate dry stage: Secondary | ICD-10-CM | POA: Diagnosis not present

## 2019-07-23 DIAGNOSIS — H5203 Hypermetropia, bilateral: Secondary | ICD-10-CM | POA: Diagnosis not present

## 2019-07-23 DIAGNOSIS — Z7984 Long term (current) use of oral hypoglycemic drugs: Secondary | ICD-10-CM | POA: Diagnosis not present

## 2019-07-30 ENCOUNTER — Encounter: Payer: Self-pay | Admitting: Infectious Disease

## 2019-07-30 ENCOUNTER — Other Ambulatory Visit (HOSPITAL_COMMUNITY)
Admission: RE | Admit: 2019-07-30 | Discharge: 2019-07-30 | Disposition: A | Discharge status: Home / Self Care | Payer: Medicare Other | Source: Ambulatory Visit | Attending: Infectious Disease | Admitting: Infectious Disease

## 2019-07-30 ENCOUNTER — Ambulatory Visit: Payer: Medicare Other | Admitting: Infectious Disease

## 2019-07-30 ENCOUNTER — Other Ambulatory Visit: Payer: Self-pay

## 2019-07-30 VITALS — BP 121/75 | HR 68 | Wt 188.8 lb

## 2019-07-30 DIAGNOSIS — Z79899 Other long term (current) drug therapy: Secondary | ICD-10-CM

## 2019-07-30 DIAGNOSIS — B181 Chronic viral hepatitis B without delta-agent: Secondary | ICD-10-CM | POA: Diagnosis not present

## 2019-07-30 DIAGNOSIS — Z23 Encounter for immunization: Secondary | ICD-10-CM | POA: Diagnosis not present

## 2019-07-30 DIAGNOSIS — Z113 Encounter for screening for infections with a predominantly sexual mode of transmission: Secondary | ICD-10-CM | POA: Diagnosis not present

## 2019-07-30 NOTE — Progress Notes (Signed)
Subjective:  Chief complaint follow-up for hepatitis B infection and need for preexposure prophylaxis   Patient ID: Jacob Ponce, male    DOB: 28-Jul-1940, 79 y.o.   MRN: [id number]  HPI  Jacob Ponce is a 79 year old man with DM, HTN, hyperlipidemia who contracted Hepatitis B last summer. He had had unprotected anal intercourse with another man in July and went for testing at the [facility] where he tested negative.   He then was hospitalized with pneumonia and found to have an acute elevation of his transaminases in August 2020 with his ALT being 511 and AST 165 and his HIV test was negative at that time.  Since then he has been seen by Dr. Cristina Gong who has performed serologies on the patient as recently as April 2021.  At that time his hepatitis B surface antigen remained positive his hepatitis B E antigen was positive his hepatitis B E antigen antibody was negative his surface antibody qualitative was negative and his hepatitis B core antibody was positive on IgM testing in total.  Tested negative for hepatitis C.  We checked hepatitis B viral load which was at 3,500,000 copies. Transaminases were relatively stable. I did feel that it would be prudent to initiate therapy given his age in particular for hepatitis B but to do so in the form of DESCOVY which would also serve him in providing preexposure prophylaxis.  He has been on DESCOVY for a month and tolerated quite well.  He tells me that the sexual encounters he has had recently have not involved intercourse he had many questions about the risk of hepatitis B transmission to other sexual partners  Review of Systems  Constitutional: Negative for activity change, appetite change, chills, diaphoresis, fatigue, fever and unexpected weight change.  HENT: Negative for congestion, rhinorrhea, sinus pressure, sneezing, sore throat and trouble swallowing.   Eyes: Negative for photophobia and visual disturbance.  Respiratory: Negative for cough, chest  tightness, shortness of breath, wheezing and stridor.   Cardiovascular: Negative for chest pain, palpitations and leg swelling.  Gastrointestinal: Negative for abdominal distention, abdominal pain, anal bleeding, blood in stool, constipation, diarrhea, nausea and vomiting.  Genitourinary: Negative for difficulty urinating, dysuria, flank pain and hematuria.  Musculoskeletal: Negative for arthralgias, back pain, gait problem, joint swelling and myalgias.  Skin: Negative for color change, pallor, rash and wound.  Neurological: Negative for dizziness, tremors, weakness and light-headedness.  Hematological: Negative for adenopathy. Does not bruise/bleed easily.  Psychiatric/Behavioral: Negative for agitation, behavioral problems, confusion, decreased concentration, dysphoric mood and sleep disturbance.       Objective:   Physical Exam Constitutional:      General: He is not in acute distress.    Appearance: Normal appearance. He is well-developed. He is not ill-appearing or diaphoretic.  HENT:     Head: Normocephalic and atraumatic.     Right Ear: Hearing and external ear normal.     Left Ear: Hearing and external ear normal.     Nose: No nasal deformity or rhinorrhea.  Eyes:     General: No scleral icterus.    Conjunctiva/sclera: Conjunctivae normal.     Right eye: Right conjunctiva is not injected.     Left eye: Left conjunctiva is not injected.     Pupils: Pupils are equal, round, and reactive to light.  Neck:     Vascular: No JVD.  Cardiovascular:     Rate and Rhythm: Normal rate and regular rhythm.     Heart sounds: S1  normal and S2 normal.  Pulmonary:     Effort: Pulmonary effort is normal. No respiratory distress.  Abdominal:     General: Bowel sounds are normal. There is no distension.     Palpations: Abdomen is soft.     Tenderness: There is no abdominal tenderness.  Musculoskeletal:        General: Normal range of motion.     Right shoulder: Normal.     Left shoulder:  Normal.     Cervical back: Normal range of motion and neck supple.     Right hip: Normal.     Left hip: Normal.     Right knee: Normal.     Left knee: Normal.  Lymphadenopathy:     Head:     Right side of head: No submandibular, preauricular or posterior auricular adenopathy.     Left side of head: No submandibular, preauricular or posterior auricular adenopathy.     Cervical: No cervical adenopathy.     Right cervical: No superficial or deep cervical adenopathy.    Left cervical: No superficial or deep cervical adenopathy.  Skin:    General: Skin is warm and dry.     Coloration: Skin is not pale.     Findings: No abrasion, bruising, ecchymosis, erythema, lesion or rash.     Nails: There is no clubbing.  Neurological:     Mental Status: He is alert and oriented to person, place, and time.     Sensory: No sensory deficit.     Coordination: Coordination normal.     Gait: Gait normal.  Psychiatric:        Attention and Perception: He is attentive.        Mood and Affect: Mood normal.        Speech: Speech normal.        Behavior: Behavior normal. Behavior is cooperative.        Thought Content: Thought content normal.        Judgment: Judgment normal.           Assessment & Plan:   Chronic hepatitis B without delta agent:  He is on DESCOVY we will check a CMP today.  I will get a vaccination for hepatitis A.  His ultrasound was negative for hepatocellular carcinoma.  Need for preexposure prophylaxis we will screen him for STIs including HIV syphilis gonorrhea and chlamydia genital and extra genital sites. Check if HIV negative I will give him a 46-month rx of DESCOVY.  I told him that the risk of hepatitis B transmission in the context of treatment of hepatitis B has not been studied as rigorously as that of HIV.  I do think that by treating him with DESCOVY we will suppress his hep B virus and help prevent transmission of hepatitis B to sexual partners I encouraged him  to make sure that his sexual partners were also aware of the HIV and hepatitis B serostatus were vaccinated and were on preexposure prophylaxis.

## 2019-07-31 ENCOUNTER — Other Ambulatory Visit: Payer: Self-pay

## 2019-07-31 DIAGNOSIS — B181 Chronic viral hepatitis B without delta-agent: Secondary | ICD-10-CM

## 2019-07-31 DIAGNOSIS — Z7251 High risk heterosexual behavior: Secondary | ICD-10-CM

## 2019-07-31 LAB — COMPLETE METABOLIC PANEL WITH GFR
AG Ratio: 1.5 (calc) (ref 1.0–2.5)
ALT: 43 U/L (ref 9–46)
AST: 35 U/L (ref 10–35)
Albumin: 3.8 g/dL (ref 3.6–5.1)
Alkaline phosphatase (APISO): 48 U/L (ref 35–144)
BUN: 23 mg/dL (ref 7–25)
CO2: 25 mmol/L (ref 20–32)
Calcium: 9.7 mg/dL (ref 8.6–10.3)
Chloride: 104 mmol/L (ref 98–110)
Creat: 0.94 mg/dL (ref 0.70–1.18)
GFR, Est African American: 90 mL/min/{1.73_m2} (ref >=60)
GFR, Est Non African American: 78 mL/min/{1.73_m2} (ref >=60)
Globulin: 2.6 g/dL (calc) (ref 1.9–3.7)
Glucose, Bld: 130 mg/dL — ABNORMAL HIGH (ref 65–99)
Potassium: 4.5 mmol/L (ref 3.5–5.3)
Sodium: 135 mmol/L (ref 135–146)
Total Bilirubin: 0.6 mg/dL (ref 0.2–1.2)
Total Protein: 6.4 g/dL (ref 6.1–8.1)

## 2019-07-31 LAB — CYTOLOGY, (ORAL, ANAL, URETHRAL) ANCILLARY ONLY
Chlamydia: NEGATIVE
Chlamydia: NEGATIVE
Comment: NEGATIVE
Comment: NEGATIVE
Comment: NORMAL
Comment: NORMAL
Neisseria Gonorrhea: NEGATIVE
Neisseria Gonorrhea: NEGATIVE

## 2019-07-31 LAB — HIV ANTIBODY (ROUTINE TESTING W REFLEX): HIV 1&2 Ab, 4th Generation: NONREACTIVE

## 2019-07-31 LAB — URINE CYTOLOGY ANCILLARY ONLY
Chlamydia: NEGATIVE
Comment: NEGATIVE
Comment: NORMAL
Neisseria Gonorrhea: NEGATIVE

## 2019-07-31 LAB — RPR: RPR Ser Ql: NONREACTIVE

## 2019-07-31 MED ORDER — DESCOVY 200-25 MG PO TABS: 1.0000 | ORAL_TABLET | Freq: Every day | ORAL | 0 refills | Status: DC

## 2019-07-31 NOTE — Addendum Note (Signed)
Addended by: Andree Coss on: 07/31/2019 05:23 PM   Modules accepted: Orders

## 2019-08-02 ENCOUNTER — Encounter: Payer: Self-pay | Admitting: Family Medicine

## 2019-08-03 ENCOUNTER — Other Ambulatory Visit: Payer: Self-pay | Admitting: Infectious Disease

## 2019-08-23 ENCOUNTER — Other Ambulatory Visit: Payer: Self-pay | Admitting: Pharmacist

## 2019-08-23 DIAGNOSIS — Z7251 High risk heterosexual behavior: Secondary | ICD-10-CM

## 2019-08-23 DIAGNOSIS — B181 Chronic viral hepatitis B without delta-agent: Secondary | ICD-10-CM

## 2019-08-24 ENCOUNTER — Encounter: Payer: Self-pay | Admitting: Family Medicine

## 2019-08-25 ENCOUNTER — Telehealth: Payer: Self-pay | Admitting: Family Medicine

## 2019-08-25 MED ORDER — TRIAMCINOLONE ACETONIDE 0.1 % EX CREA
1.0000 | TOPICAL_CREAM | Freq: Two times a day (BID) | CUTANEOUS | 0 refills | Status: DC
Start: 2019-08-25 — End: 2019-10-28

## 2019-08-25 NOTE — Telephone Encounter (Signed)
Please triage patient about his rash.  Thanks.

## 2019-08-25 NOTE — Telephone Encounter (Signed)
Spoke to patient and was advised that the rash started about two weeks ago. Patient stated that he has not changed anything. Patient stated that the rash is all over but mainly on his back. Patient stated that it does itch and he has been scratching it. Patient stated that the rash appears like rough patches. Patient stated that he took a Claritin yesterday which helped some but not much. Patient denies any SOB or difficulty breathing.

## 2019-08-25 NOTE — Telephone Encounter (Signed)
Patient advised.

## 2019-08-25 NOTE — Telephone Encounter (Signed)
Would continue claritin, use topical TAC if needed (rx sent).  We likely need to check him at [facility] if this doesn't resolve in the near future.  Thanks.

## 2019-08-25 NOTE — Addendum Note (Signed)
Addended by: Joaquim Nam on: 08/25/2019 02:01 PM   Modules accepted: Orders

## 2019-09-03 ENCOUNTER — Other Ambulatory Visit: Payer: Self-pay | Admitting: Pharmacist

## 2019-09-03 DIAGNOSIS — B181 Chronic viral hepatitis B without delta-agent: Secondary | ICD-10-CM

## 2019-09-03 DIAGNOSIS — Z7251 High risk heterosexual behavior: Secondary | ICD-10-CM

## 2019-09-26 ENCOUNTER — Other Ambulatory Visit: Payer: Self-pay | Admitting: Family Medicine

## 2019-09-26 DIAGNOSIS — E119 Type 2 diabetes mellitus without complications: Secondary | ICD-10-CM

## 2019-09-26 DIAGNOSIS — Z125 Encounter for screening for malignant neoplasm of prostate: Secondary | ICD-10-CM

## 2019-10-01 ENCOUNTER — Other Ambulatory Visit: Payer: Self-pay | Admitting: Pharmacist

## 2019-10-01 DIAGNOSIS — B181 Chronic viral hepatitis B without delta-agent: Secondary | ICD-10-CM

## 2019-10-01 DIAGNOSIS — Z7251 High risk heterosexual behavior: Secondary | ICD-10-CM

## 2019-10-05 ENCOUNTER — Other Ambulatory Visit: Payer: Self-pay

## 2019-10-05 ENCOUNTER — Other Ambulatory Visit (INDEPENDENT_AMBULATORY_CARE_PROVIDER_SITE_OTHER): Payer: Medicare Other

## 2019-10-05 ENCOUNTER — Ambulatory Visit: Payer: Medicare Other

## 2019-10-05 DIAGNOSIS — Z125 Encounter for screening for malignant neoplasm of prostate: Secondary | ICD-10-CM | POA: Diagnosis not present

## 2019-10-05 DIAGNOSIS — E119 Type 2 diabetes mellitus without complications: Secondary | ICD-10-CM

## 2019-10-05 LAB — COMPREHENSIVE METABOLIC PANEL
ALT: 42 U/L (ref 0–53)
AST: 33 U/L (ref 0–37)
Albumin: 4 g/dL (ref 3.5–5.2)
Alkaline Phosphatase: 50 U/L (ref 39–117)
BUN: 26 mg/dL — ABNORMAL HIGH (ref 6–23)
CO2: 23 mEq/L (ref 19–32)
Calcium: 9.9 mg/dL (ref 8.4–10.5)
Chloride: 102 mEq/L (ref 96–112)
Creatinine, Ser: 1.03 mg/dL (ref 0.40–1.50)
GFR: 69.91 mL/min (ref >60.00)
Glucose, Bld: 212 mg/dL — ABNORMAL HIGH (ref 70–99)
Potassium: 4.5 mEq/L (ref 3.5–5.1)
Sodium: 135 mEq/L (ref 135–145)
Total Bilirubin: 0.7 mg/dL (ref 0.2–1.2)
Total Protein: 6.8 g/dL (ref 6.0–8.3)

## 2019-10-05 LAB — PSA, MEDICARE: PSA: 4.12 ng/ml — ABNORMAL HIGH (ref 0.10–4.00)

## 2019-10-05 LAB — LIPID PANEL
Cholesterol: 187 mg/dL (ref 0–200)
HDL: 38.4 mg/dL — ABNORMAL LOW (ref >39.00)
LDL Cholesterol: 130 mg/dL — ABNORMAL HIGH (ref 0–99)
NonHDL: 149.06
Total CHOL/HDL Ratio: 5
Triglycerides: 97 mg/dL (ref 0.0–149.0)
VLDL: 19.4 mg/dL (ref 0.0–40.0)

## 2019-10-05 LAB — HEMOGLOBIN A1C: Hgb A1c MFr Bld: 6.1 % (ref 4.6–6.5)

## 2019-10-07 ENCOUNTER — Other Ambulatory Visit: Payer: Self-pay

## 2019-10-07 ENCOUNTER — Ambulatory Visit (INDEPENDENT_AMBULATORY_CARE_PROVIDER_SITE_OTHER): Payer: Medicare Other

## 2019-10-07 DIAGNOSIS — Z0000 Encounter for general adult medical examination without abnormal findings: Secondary | ICD-10-CM | POA: Diagnosis not present

## 2019-10-07 NOTE — Progress Notes (Signed)
Subjective:   Jacob Ponce is a 79 y.o. male who presents for Medicare Annual/Subsequent preventive examination.  Review of Systems: N/A      I connected with the patient today by telephone and verified that I am speaking with the correct person using two identifiers. Location patient: home Location nurse: work Persons participating in the telephone visit: patient, nurse.   I discussed the limitations, risks, security and privacy concerns of performing an evaluation and management service by telephone and the availability of in person appointments. I also discussed with the patient that there may be a patient responsible charge related to this service. The patient expressed understanding and verbally consented to this telephonic visit.        Cardiac Risk Factors include: advanced age (>6455men, 34>65 women);diabetes mellitus;hypertension;male gender;dyslipidemia     Objective:    Today's Vitals   There is no height or weight on file to calculate BMI.  Advanced Directives 10/07/2019 09/14/2018 11/07/2017 09/20/2017 09/12/2016  Does Patient Have a Medical Advance Directive? Yes Yes Yes Yes Yes  Type of Estate agentAdvance Directive Healthcare Power of [place] North[place]orney;Living will Healthcare Power of [place];Living will Healthcare Power of [place];Living will Healthcare Power of [place]orney;Living will Healthcare Power of [place];Living will  Copy of Healthcare Power of Attorney in Chart? No - copy requested No - copy requested - No - copy requested No - copy requested  Would patient like information on creating a medical advance directive? - No - Patient declined - - -    Current Medications (verified) Outpatient Encounter Medications as of 10/07/2019  Medication Sig  . aspirin 81 MG tablet Take 81 mg by mouth daily.    . DESCOVY 200-25 MG tablet TAKE 1 TABLET BY MOUTH  DAILY  . doxazosin (CARDURA) 8 MG tablet Take 1 tablet (8 mg total) by mouth at bedtime.  Marland Kitchen. glucose blood (ONETOUCH ULTRA) test strip USE TO  CHECK BLOOD SUGAR DAILY. DX: E11.9  . Lancets (ONETOUCH ULTRASOFT) lancets Use to check blood sugar daily.  Dx: E11.9  . loratadine (CLARITIN) 10 MG tablet Take 10 mg by mouth daily as needed for allergies.  . metFORMIN (GLUCOPHAGE) 500 MG tablet TAKE 1 TABLET TWICE DAILY WITH A MEAL.  . pioglitazone (ACTOS) 30 MG tablet Take 0.5 tablets (15 mg total) by mouth daily.  Marland Kitchen. losartan (COZAAR) 50 MG tablet HELD (Patient not taking: Reported on 04/14/2019)  . simvastatin (ZOCOR) 40 MG tablet HELD (Patient not taking: Reported on 04/14/2019)  . triamcinolone cream (KENALOG) 0.1 % Apply 1 application topically 2 (two) times daily.   No facility-administered encounter medications on file as of 10/07/2019.    Allergies (verified) Patient has no known allergies.   History: Past Medical History:  Diagnosis Date  . BPH (benign prostatic hyperplasia)   . Diabetes mellitus    Type II, controlled  . Fracture 2011   Right proximal humerus, resolved w/o deficit  . HBV (hepatitis B virus) infection   . History of cardiac cath 1999   and Tilt Table Test, both negative  . Hyperlipidemia   . Hypertension   . Impotence of organic origin   . Special screening for malignant neoplasms of other sites    Past Surgical History:  Procedure Laterality Date  . APPENDECTOMY    . TIBIA FRACTURE SURGERY     Left, resolved without deficit  . TONSILLECTOMY     Family History  Problem Relation Age of Onset  . Cancer Father  colon  . Seizures Father        after head injury  . Colon cancer Father   . Colon cancer Mother   . Arthritis Brother   . Prostate cancer Neg Hx    Social History   Socioeconomic History  . Marital status: Married    Spouse name: Not on file  . Number of children: 4  . Years of education: Not on file  . Highest education level: Not on file  Occupational History  . Occupation: Art gallery manager at [employer]    Employer: [employer]  . Occupation: Occupational hygienist  Tobacco Use  . Smoking status:  Former Smoker    Types: Cigarettes    Quit date: 02/05/1985    Years since quitting: 34.6  . Smokeless tobacco: Never Used  . Tobacco comment: Smoked from 1966 to 1987  Substance and Sexual Activity  . Alcohol use: No    Alcohol/week: 0.0 standard drinks  . Drug use: No  . Sexual activity: Never  Other Topics Concern  . Not on file  Social History Narrative   Undergrad and Masters at [employer].   Second marriage since 1987   4 children, 3 step-children   Flew small aircraft- retired from flying 2019   Working 3-4 days a week as of 2021   Social Determinants of Corporate investment banker Strain: Low Risk   . Difficulty of Paying Living Expenses: Not hard at all  Food Insecurity: No Food Insecurity  . Worried About Programme researcher, broadcasting/film/video in the Last Year: Never true  . Ran Out of Food in the Last Year: Never true  Transportation Needs: No Transportation Needs  . Lack of Transportation (Medical): No  . Lack of Transportation (Non-Medical): No  Physical Activity: Sufficiently Active  . Days of Exercise per Week: 7 days  . Minutes of Exercise per Session: 60 min  Stress: No Stress Concern Present  . Feeling of Stress : Not at all  Social Connections:   . Frequency of Communication with Friends and Family: Not on file  . Frequency of Social Gatherings with Friends and Family: Not on file  . Attends Religious Services: Not on file  . Active Member of Clubs or Organizations: Not on file  . Attends Banker Meetings: Not on file  . Marital Status: Not on file    Tobacco Counseling Counseling given: Not Answered Comment: Smoked from 1966 to 1987   Clinical Intake:  Pre-visit preparation completed: Yes  Pain : No/denies pain     Nutritional Risks: None Diabetes: Yes CBG done?: No Did pt. bring in CBG monitor from home?: No  How often do you need to have someone help you when you read instructions, pamphlets, or other written materials from your doctor or  pharmacy?: 1 - Never What is the last grade level you completed in school?: masters  Diabetic: Yes Nutrition Risk Assessment:  Has the patient had any N/V/D within the last 2 months?  No  Does the patient have any non-healing wounds?  No  Has the patient had any unintentional weight loss or weight gain?  No   Diabetes:  Is the patient diabetic?  Yes  If diabetic, was a CBG obtained today?  No  Did the patient bring in their glucometer from home?  No  How often do you monitor your CBG's? daily.   Financial Strains and Diabetes Management:  Are you having any financial strains with the device, your supplies or your  medication? No .  Does the patient want to be seen by Chronic Care Management for management of their diabetes?  No  Would the patient like to be referred to a Nutritionist or for Diabetic Management?  No   Diabetic Exams:  Diabetic Eye Exam: Completed 08/12/2019 Diabetic Foot Exam: Overdue, Pt has been advised about the importance in completing this exam. Pt is scheduled for diabetic foot exam on 10/09/2019.   Interpreter Needed?: No  Information entered by :: CJohnson, LPN   Activities of Daily Living In your present state of health, do you have any difficulty performing the following activities: 10/07/2019  Hearing? Y  Comment 50% hearing loss  Vision? N  Difficulty concentrating or making decisions? Y  Comment some short term memory loss  Walking or climbing stairs? N  Dressing or bathing? N  Doing errands, shopping? N  Preparing Food and eating ? N  Using the Toilet? N  In the past six months, have you accidently leaked urine? Y  Comment wears pads  Do you have problems with loss of bowel control? N  Managing your Medications? N  Managing your Finances? N  Housekeeping or managing your Housekeeping? N  Some recent data might be hidden    Patient Care Team: Joaquim Nam, MD as PCP - Benay Pike, MD as Consulting Physician  (Gastroenterology)  Indicate any recent Medical Services you may have received from other than [facility] providers in the past year (date may be approximate).     Assessment:   This is a routine wellness examination for Aris.  Hearing/Vision screen  Hearing Screening   125Hz  250Hz  500Hz  1000Hz  2000Hz  3000Hz  4000Hz  6000Hz  8000Hz   Right ear:           Left ear:           Vision Screening Comments: Patient gets annual eye exams  Dietary issues and exercise activities discussed: Current Exercise Habits: Home exercise routine, Type of exercise: walking, Time (Minutes): 60, Frequency (Times/Week): 7, Weekly Exercise (Minutes/Week): 420, Intensity: Moderate, Exercise limited by: None identified  Goals    . Patient Stated     09/20/2017, I will continue to take medications as prescribed.     . Patient Stated     10/07/2019, I will continue to walk every morning for about 2 miles.       Depression Screen PHQ 2/9 Scores 10/07/2019 10/02/2018 09/20/2017 09/12/2016 03/29/2014 08/10/2013  PHQ - 2 Score 2 0 0 0 0 0  PHQ- 9 Score 4 - 0 - - -    Fall Risk Fall Risk  10/07/2019 10/02/2018 09/20/2017 09/12/2016 03/29/2014  Falls in the past year? 1 0 No No Yes  Comment fell off of chair - - - -  Number falls in past yr: 0 - - - 1  Injury with Fall? 0 - - - -  Risk for fall due to : Medication side effect - - - -  Follow up Falls evaluation completed;Falls prevention discussed - - - -    Any stairs in or around the home? Yes  If so, are there any without handrails? No  Home free of loose throw rugs in walkways, pet beds, electrical cords, etc? Yes  Adequate lighting in your home to reduce risk of falls? Yes   ASSISTIVE DEVICES UTILIZED TO PREVENT FALLS:  Life alert? No  Use of a cane, walker or w/c? No  Grab bars in the bathroom? No  Shower chair or bench in shower?  No  Elevated toilet seat or a handicapped toilet? No   TIMED UP AND GO:  Was the test performed? N/A, telephonic visit .     Cognitive Function: MMSE - Mini Mental State Exam 10/07/2019 09/20/2017 09/12/2016  Orientation to time 5 5 5   Orientation to Place 5 5 5   Registration 3 3 3   Attention/ Calculation 5 0 0  Recall 3 3 3   Language- name 2 objects - 0 0  Language- repeat 1 1 1   Language- follow 3 step command - 3 3  Language- read & follow direction - 0 0  Write a sentence - 0 0  Copy design - 0 0  Total score - 20 20  Mini Cog  Mini-Cog screen was completed. Maximum score is 22. A value of 0 denotes this part of the MMSE was not completed or the patient failed this part of the Mini-Cog screening.       Immunizations Immunization History  Administered Date(s) Administered  . Hepatitis A, Adult 07/30/2019  . Influenza, High Dose Seasonal PF 03/28/2018  . PFIZER SARS-COV-2 Vaccination 03/12/2019, 04/07/2019  . Pneumococcal Conjugate-13 08/31/2014  . Pneumococcal Polysaccharide-23 02/06/2004, 09/18/2011  . Td 02/06/2004  . Tdap 12/04/2016    TDAP status: Up to date Flu Vaccine status: due, will get at upcoming office visit.  Pneumococcal vaccine status: Up to date Covid-19 vaccine status: Completed vaccines  Qualifies for Shingles Vaccine? Yes   Zostavax completed No   Shingrix Completed?: No.    Education has been provided regarding the importance of this vaccine. Patient has been advised to call insurance company to determine out of pocket expense if they have not yet received this vaccine. Advised may also receive vaccine at local pharmacy or Health Dept. Verbalized acceptance and understanding.  Screening Tests Health Maintenance  Topic Date Due  . Hepatitis C Screening  Never done  . INFLUENZA VACCINE  09/06/2019  . FOOT EXAM  10/02/2019  . HEMOGLOBIN A1C  04/04/2020  . COLONOSCOPY  06/26/2020  . OPHTHALMOLOGY EXAM  08/11/2020  . TETANUS/TDAP  12/05/2026  . COVID-19 Vaccine  Completed  . PNA vac Low Risk Adult  Completed    Health Maintenance  Health Maintenance Due  Topic  Date Due  . Hepatitis C Screening  Never done  . INFLUENZA VACCINE  09/06/2019  . FOOT EXAM  10/02/2019    Colorectal cancer screening: Completed 06/27/2015. Repeat every 5 years  Lung Cancer Screening: (Low Dose CT Chest recommended if Age 56-80 years, 30 pack-year currently smoking OR have quit w/in 15 years.) does not qualify.    Additional Screening:  Hepatitis C Screening: does qualify; Completed due  Vision Screening: Recommended annual ophthalmology exams for early detection of glaucoma and other disorders of the eye. Is the patient up to date with their annual eye exam?  Yes  Who is the provider or what is the name of the office in which the patient attends annual eye exams? Dr. 10/12/2020  If pt is not established with a provider, would they like to be referred to a provider to establish care? No .   Dental Screening: Recommended annual dental exams for proper oral hygiene  Community Resource Referral / Chronic Care Management: CRR required this visit?  No   CCM required this visit?  No      Plan:     I have personally reviewed and noted the following in the patient's chart:   . Medical and social history . Use of alcohol,  tobacco or illicit drugs  . Current medications and supplements . Functional ability and status . Nutritional status . Physical activity . Advanced directives . List of other physicians . Hospitalizations, surgeries, and ER visits in previous 12 months . Vitals . Screenings to include cognitive, depression, and falls . Referrals and appointments  In addition, I have reviewed and discussed with patient certain preventive protocols, quality metrics, and best practice recommendations. A written personalized care plan for preventive services as well as general preventive health recommendations were provided to patient.   Due to this being a telephonic visit, the after visit summary with patients personalized plan was offered to patient via mail or  my-chart. Patient preferred to pick up at office at next visit.   Janalyn Shy, LPN   07/09/8451

## 2019-10-07 NOTE — Patient Instructions (Signed)
Jacob Ponce , Thank you for taking time to come for your Medicare Wellness Visit. I appreciate your ongoing commitment to your health goals. Please review the following plan we discussed and let me know if I can assist you in the future.   Screening recommendations/referrals: Colonoscopy: Up to date, completed 06/27/2015, due 06/2020 Recommended yearly ophthalmology/optometry visit for glaucoma screening and checkup Recommended yearly dental visit for hygiene and checkup  Vaccinations: Influenza vaccine: due, will get at upcoming office visit  Pneumococcal vaccine: Completed series Tdap vaccine: Up to date, completed 12/04/2016, due 11/2026 Shingles vaccine: due, check with your insurance regarding coverage    Covid-19: Completed series  Advanced directives: Please bring a copy of your POA (Power of Attorney) and/or Living Will to your next appointment.   Conditions/risks identified: diabetes, hypertension, hyperlipidemia  Next appointment: Follow up in one year for your annual wellness visit.   Preventive Care 15 Years and Older, Male Preventive care refers to lifestyle choices and visits with your health care provider that can promote health and wellness. What does preventive care include?  A yearly physical exam. This is also called an annual well check.  Dental exams once or twice a year.  Routine eye exams. Ask your health care provider how often you should have your eyes checked.  Personal lifestyle choices, including:  Daily care of your teeth and gums.  Regular physical activity.  Eating a healthy diet.  Avoiding tobacco and drug use.  Limiting alcohol use.  Practicing safe sex.  Taking low doses of aspirin every day.  Taking vitamin and mineral supplements as recommended by your health care provider. What happens during an annual well check? The services and screenings done by your health care provider during your annual well check will depend on your age, overall  health, lifestyle risk factors, and family history of disease. Counseling  Your health care provider may ask you questions about your:  Alcohol use.  Tobacco use.  Drug use.  Emotional well-being.  Home and relationship well-being.  Sexual activity.  Eating habits.  History of falls.  Memory and ability to understand (cognition).  Work and work Astronomer. Screening  You may have the following tests or measurements:  Height, weight, and BMI.  Blood pressure.  Lipid and cholesterol levels. These may be checked every 5 years, or more frequently if you are over 78 years old.  Skin check.  Lung cancer screening. You may have this screening every year starting at age 18 if you have a 30-pack-year history of smoking and currently smoke or have quit within the past 15 years.  Fecal occult blood test (FOBT) of the stool. You may have this test every year starting at age 82.  Flexible sigmoidoscopy or colonoscopy. You may have a sigmoidoscopy every 5 years or a colonoscopy every 10 years starting at age 46.  Prostate cancer screening. Recommendations will vary depending on your family history and other risks.  Hepatitis C blood test.  Hepatitis B blood test.  Sexually transmitted disease (STD) testing.  Diabetes screening. This is done by checking your blood sugar (glucose) after you have not eaten for a while (fasting). You may have this done every 1-3 years.  Abdominal aortic aneurysm (AAA) screening. You may need this if you are a current or former smoker.  Osteoporosis. You may be screened starting at age 65 if you are at high risk. Talk with your health care provider about your test results, treatment options, and if necessary, the need  for more tests. Vaccines  Your health care provider may recommend certain vaccines, such as:  Influenza vaccine. This is recommended every year.  Tetanus, diphtheria, and acellular pertussis (Tdap, Td) vaccine. You may need a Td  booster every 10 years.  Zoster vaccine. You may need this after age 8.  Pneumococcal 13-valent conjugate (PCV13) vaccine. One dose is recommended after age 62.  Pneumococcal polysaccharide (PPSV23) vaccine. One dose is recommended after age 70. Talk to your health care provider about which screenings and vaccines you need and how often you need them. This information is not intended to replace advice given to you by your health care provider. Make sure you discuss any questions you have with your health care provider. Document Released: 02/18/2015 Document Revised: 10/12/2015 Document Reviewed: 11/23/2014 Elsevier Interactive Patient Education  2017 [employer] Prevention in the Home Falls can cause injuries. They can happen to people of all ages. There are many things you can do to make your home safe and to help prevent falls. What can I do on the outside of my home?  Regularly fix the edges of walkways and driveways and fix any cracks.  Remove anything that might make you trip as you walk through a door, such as a raised step or threshold.  Trim any bushes or trees on the path to your home.  Use bright outdoor lighting.  Clear any walking paths of anything that might make someone trip, such as rocks or tools.  Regularly check to see if handrails are loose or broken. Make sure that both sides of any steps have handrails.  Any raised decks and porches should have guardrails on the edges.  Have any leaves, snow, or ice cleared regularly.  Use sand or salt on walking paths during winter.  Clean up any spills in your garage right away. This includes oil or grease spills. What can I do in the bathroom?  Use night lights.  Install grab bars by the toilet and in the tub and shower. Do not use towel bars as grab bars.  Use non-skid mats or decals in the tub or shower.  If you need to sit down in the shower, use a plastic, non-slip stool.  Keep the floor dry. Clean up  any water that spills on the floor as soon as it happens.  Remove soap buildup in the tub or shower regularly.  Attach bath mats securely with double-sided non-slip rug tape.  Do not have throw rugs and other things on the floor that can make you trip. What can I do in the bedroom?  Use night lights.  Make sure that you have a light by your bed that is easy to reach.  Do not use any sheets or blankets that are too big for your bed. They should not hang down onto the floor.  Have a firm chair that has side arms. You can use this for support while you get dressed.  Do not have throw rugs and other things on the floor that can make you trip. What can I do in the kitchen?  Clean up any spills right away.  Avoid walking on wet floors.  Keep items that you use a lot in easy-to-reach places.  If you need to reach something above you, use a strong step stool that has a grab bar.  Keep electrical cords out of the way.  Do not use floor polish or wax that makes floors slippery. If you must use wax, use  non-skid floor wax.  Do not have throw rugs and other things on the floor that can make you trip. What can I do with my stairs?  Do not leave any items on the stairs.  Make sure that there are handrails on both sides of the stairs and use them. Fix handrails that are broken or loose. Make sure that handrails are as long as the stairways.  Check any carpeting to make sure that it is firmly attached to the stairs. Fix any carpet that is loose or worn.  Avoid having throw rugs at the top or bottom of the stairs. If you do have throw rugs, attach them to the floor with carpet tape.  Make sure that you have a light switch at the top of the stairs and the bottom of the stairs. If you do not have them, ask someone to add them for you. What else can I do to help prevent falls?  Wear shoes that:  Do not have high heels.  Have rubber bottoms.  Are comfortable and fit you well.  Are  closed at the toe. Do not wear sandals.  If you use a stepladder:  Make sure that it is fully opened. Do not climb a closed stepladder.  Make sure that both sides of the stepladder are locked into place.  Ask someone to hold it for you, if possible.  Clearly mark and make sure that you can see:  Any grab bars or handrails.  First and last steps.  Where the edge of each step is.  Use tools that help you move around (mobility aids) if they are needed. These include:  Canes.  Walkers.  Scooters.  Crutches.  Turn on the lights when you go into a dark area. Replace any light bulbs as soon as they burn out.  Set up your furniture so you have a clear path. Avoid moving your furniture around.  If any of your floors are uneven, fix them.  If there are any pets around you, be aware of where they are.  Review your medicines with your doctor. Some medicines can make you feel dizzy. This can increase your chance of falling. Ask your doctor what other things that you can do to help prevent falls. This information is not intended to replace advice given to you by your health care provider. Make sure you discuss any questions you have with your health care provider. Document Released: 11/18/2008 Document Revised: 06/30/2015 Document Reviewed: 02/26/2014 Elsevier Interactive Patient Education  2017 [employer].

## 2019-10-07 NOTE — Progress Notes (Signed)
PCP notes:  Health Maintenance: Foot exam- due Flu- due   Abnormal Screenings: none   Patient concerns: Trouble sleeping Sore throat- onset 1 year ago Prostate issues- urinating frequently Itchy skin    Nurse concerns: none   Next PCP appt.: 10/09/2019 @ 9:30 am

## 2019-10-09 ENCOUNTER — Other Ambulatory Visit: Payer: Self-pay

## 2019-10-09 ENCOUNTER — Ambulatory Visit (INDEPENDENT_AMBULATORY_CARE_PROVIDER_SITE_OTHER): Payer: Medicare Other | Admitting: Family Medicine

## 2019-10-09 ENCOUNTER — Encounter: Payer: Self-pay | Admitting: Family Medicine

## 2019-10-09 VITALS — BP 112/60 | HR 66 | Temp 97.2°F | Ht 70.0 in | Wt 183.1 lb

## 2019-10-09 DIAGNOSIS — E119 Type 2 diabetes mellitus without complications: Secondary | ICD-10-CM

## 2019-10-09 DIAGNOSIS — J029 Acute pharyngitis, unspecified: Secondary | ICD-10-CM | POA: Diagnosis not present

## 2019-10-09 DIAGNOSIS — R21 Rash and other nonspecific skin eruption: Secondary | ICD-10-CM | POA: Diagnosis not present

## 2019-10-09 DIAGNOSIS — R972 Elevated prostate specific antigen [PSA]: Secondary | ICD-10-CM | POA: Diagnosis not present

## 2019-10-09 DIAGNOSIS — B181 Chronic viral hepatitis B without delta-agent: Secondary | ICD-10-CM

## 2019-10-09 DIAGNOSIS — Z0000 Encounter for general adult medical examination without abnormal findings: Secondary | ICD-10-CM

## 2019-10-09 DIAGNOSIS — Z7189 Other specified counseling: Secondary | ICD-10-CM

## 2019-10-09 DIAGNOSIS — I10 Essential (primary) hypertension: Secondary | ICD-10-CM

## 2019-10-09 DIAGNOSIS — R351 Nocturia: Secondary | ICD-10-CM

## 2019-10-09 MED ORDER — TAMSULOSIN HCL 0.4 MG PO CAPS: 0.4000 mg | ORAL_CAPSULE | Freq: Every day | ORAL | 3 refills | Status: DC

## 2019-10-09 MED ORDER — PERMETHRIN 5 % EX CREA: 1.0000 "application " | TOPICAL_CREAM | Freq: Once | CUTANEOUS | 0 refills | Status: AC

## 2019-10-09 NOTE — Patient Instructions (Addendum)
Start flomax but let me know if you have lightheadedness.  We'll call about seeing urology.  Use permethrin cream and let me know if that doesn't help.  If the sore throat returns, try using claritin and see if that helps.   Take care.  Glad to see you.

## 2019-10-09 NOTE — Progress Notes (Signed)
This visit occurred during the SARS-CoV-2 public health emergency.  Safety protocols were in place, including screening questions prior to the visit, additional usage of staff PPE, and extensive cleaning of exam room while observing appropriate contact time as indicated for disinfecting solutions.  Diabetes:  Using medications without difficulties: still on metformin and actos.   Hypoglycemic episodes:no Hyperglycemic episodes:no Feet problems: no Blood Sugars averaging: usually ~120-140s.   eye exam within last year: yes Labs d/w pt.    Hypertension:    Using medication without problems or lightheadedness: yes, off ARB at this point.   Chest pain with exertion:no Edema:no Short of breath:no Walked 4 miles without difficulty.  HBV.  LFTs wnl.  Off statin.  D/w pt.  Has ID clinic f/u pending.  Still on descovy per ID clinic.    Sore throat- onset 1 year ago.  Mild, episodic.  Unclear if from post nasal gtt.  No heartburn.  No sx now.  D/w pt about trying claritin to see if that helps, if sx return.   Occ brief and limited R proximal lateral shin pain, not posterior.  No skin changes.  No trauma.  No clear trigger.  No symptoms now.  He will update me if this returns.  LUTS - urinating frequently, insomnia from nocturia.  Already on doxazosin.  D/w pt about flomax trial with routine cautions.    Itchy skin on chest.  TAC helps a little but then sx return.  Blanching rash on the chest wall, irregular distribution. Noted on back and chest.  Present for about months.  D/w pt about scabies tx.    Flu and shingles d/w pt.   He can get at pharmacy.  covid vaccine 2021 Tdap 2018 PNA up to date.  Advance directive- wife designated if patient were incapacitated.  Colonoscopy 2017  PMH and SH reviewed  Meds, vitals, and allergies reviewed.   ROS: Per HPI unless specifically indicated in ROS section   GEN: nad, alert and oriented HEENT: ncat, OP wnl.  No oral lesions. NECK: supple w/o  LA CV: rrr. PULM: ctab, no inc wob ABD: soft, +bs EXT: no edema SKIN: chronic irritated blanching lesions on the chest and back.  Not on the palms.    Diabetic foot exam: Normal inspection No skin breakdown No calluses  Normal DP pulses Normal sensation to light touch and monofilament Nails normal

## 2019-10-12 ENCOUNTER — Telehealth: Payer: Self-pay | Admitting: Family Medicine

## 2019-10-12 DIAGNOSIS — J029 Acute pharyngitis, unspecified: Secondary | ICD-10-CM | POA: Insufficient documentation

## 2019-10-12 DIAGNOSIS — L282 Other prurigo: Secondary | ICD-10-CM | POA: Insufficient documentation

## 2019-10-12 DIAGNOSIS — E785 Hyperlipidemia, unspecified: Secondary | ICD-10-CM

## 2019-10-12 NOTE — Assessment & Plan Note (Signed)
Still on Metformin and Actos.  Would continue for now.  I will check with infectious disease clinic and GI about potentially restarting statin.

## 2019-10-12 NOTE — Assessment & Plan Note (Signed)
Benign exam.  History of sore throat.  He can take Claritin if he notices return of symptoms then update me as needed.  Unclear if he has postnasal drip causing symptoms episodically.

## 2019-10-12 NOTE — Telephone Encounter (Signed)
I would like input from Dr. Matthias Hughs and from Dr. Daiva Eves about when patient can potentially restart simvastatin.  His LFTs have normalized in the meantime.  I greatly appreciate the help from both GI and infectious disease.

## 2019-10-12 NOTE — Assessment & Plan Note (Signed)
Flu and shingles d/w pt.   He can get at pharmacy.  covid vaccine 2021 Tdap 2018 PNA up to date.  Advance directive- wife designated if patient were incapacitated.  Colonoscopy 2017

## 2019-10-12 NOTE — Assessment & Plan Note (Signed)
Advance directive- wife designated if patient were incapacitated.  

## 2019-10-12 NOTE — Assessment & Plan Note (Signed)
LFTs wnl.  Off statin.  D/w pt.  Has ID clinic f/u pending.  Still on descovy per ID clinic.  Appreciate GI and infectious disease clinic help.

## 2019-10-12 NOTE — Assessment & Plan Note (Signed)
Given the persistent symptoms and the significant itching we talked about options.  He will use permethrin per routine and let me know if that does not help.  He agrees.  He may not have scabies but there is a high benefit to risk ratio with treatment.

## 2019-10-12 NOTE — Assessment & Plan Note (Signed)
Controlled.  Would continue off ARB at this point.  He agrees.  He will update me as needed.  Continue doxazosin for now.

## 2019-10-12 NOTE — Assessment & Plan Note (Signed)
Discussed options.  Reasonable to see urology.  Referred.  He agreed.

## 2019-10-13 ENCOUNTER — Telehealth: Payer: Self-pay | Admitting: *Deleted

## 2019-10-13 NOTE — Telephone Encounter (Signed)
Did we tell him to stop the statin? Descoy shouldn't be an issue for his liver unless he comes off it suddenly

## 2019-10-13 NOTE — Telephone Encounter (Signed)
I prev asked him to hold statin while his LFTs were elevated.  With LFT improvement, I wanted to make sure that restart was okay with both GI and ID.

## 2019-10-13 NOTE — Telephone Encounter (Signed)
I dont have a problem. I have another pt on an azole and they are contraindicated with statins but that is not this guy

## 2019-10-13 NOTE — Telephone Encounter (Signed)
Faxed refill request. [place] 5% Cream Last office visit:   10/09/2019 Last Filled:   ?  I don't see this on the patient's current meds list or historical.

## 2019-10-14 MED ORDER — SIMVASTATIN 40 MG PO TABS
40.0000 mg | ORAL_TABLET | Freq: Every day | ORAL | Status: DC
Start: 2019-10-14 — End: 2019-11-30

## 2019-10-14 NOTE — Addendum Note (Signed)
Addended by: Joaquim Nam on: 10/14/2019 07:41 PM   Modules accepted: Orders

## 2019-10-14 NOTE — Telephone Encounter (Addendum)
Please advise patient to restart simvastatin.  If he has any muscle aches, then update me.  I would like to recheck his lipids and LFTS in about 2 months.  I put in the orders.  He should be able to get those done either here or at [facility] on [place].  Needs to fast for labs.  Thanks.

## 2019-10-14 NOTE — Telephone Encounter (Signed)
This was sent to [facility] recently.  This was likely an auto refill request but he shouldn't need a refill yet.  Thanks.

## 2019-10-15 NOTE — Telephone Encounter (Addendum)
Patient advised and states he will probably go by the [place] office around the first of November but if he decides to come here, will call for a lab appt.

## 2019-10-18 DIAGNOSIS — Z20822 Contact with and (suspected) exposure to covid-19: Secondary | ICD-10-CM | POA: Diagnosis not present

## 2019-10-18 DIAGNOSIS — Z1159 Encounter for screening for other viral diseases: Secondary | ICD-10-CM | POA: Diagnosis not present

## 2019-10-27 ENCOUNTER — Telehealth: Payer: Self-pay

## 2019-10-27 NOTE — Telephone Encounter (Signed)
COVID-19 Pre-Screening Questions:10/27/19 ° °Do you currently have a fever (>100 °F), chills or unexplained body aches?NO ° °Are you currently experiencing new cough, shortness of breath, sore throat, runny nose? NO °•  °Have you been in contact with someone that is currently pending confirmation of Covid19 testing or has been confirmed to have the Covid19 virus? NO  ° °**If the patient answers NO to ALL questions -  advise the patient to please call the clinic before coming to the office should any symptoms develop.  ° ° °

## 2019-10-28 ENCOUNTER — Other Ambulatory Visit: Payer: Self-pay

## 2019-10-28 ENCOUNTER — Encounter: Payer: Self-pay | Admitting: Infectious Disease

## 2019-10-28 ENCOUNTER — Other Ambulatory Visit (HOSPITAL_COMMUNITY)
Admission: RE | Admit: 2019-10-28 | Discharge: 2019-10-28 | Disposition: A | Discharge status: Home / Self Care | Payer: Medicare Other | Source: Ambulatory Visit | Attending: Infectious Disease | Admitting: Infectious Disease

## 2019-10-28 ENCOUNTER — Ambulatory Visit: Payer: Medicare Other | Admitting: Infectious Disease

## 2019-10-28 VITALS — BP 159/82 | HR 74 | Temp 98.6°F | Wt 181.0 lb

## 2019-10-28 DIAGNOSIS — B181 Chronic viral hepatitis B without delta-agent: Secondary | ICD-10-CM | POA: Diagnosis not present

## 2019-10-28 DIAGNOSIS — Z79899 Other long term (current) drug therapy: Secondary | ICD-10-CM | POA: Insufficient documentation

## 2019-10-28 DIAGNOSIS — Z23 Encounter for immunization: Secondary | ICD-10-CM | POA: Diagnosis not present

## 2019-10-28 DIAGNOSIS — L282 Other prurigo: Secondary | ICD-10-CM | POA: Diagnosis not present

## 2019-10-28 MED ORDER — TRIAMCINOLONE ACETONIDE 0.5 % EX OINT: 1.0000 "application " | TOPICAL_OINTMENT | Freq: Two times a day (BID) | CUTANEOUS | 2 refills | Status: DC

## 2019-10-28 MED ORDER — CETIRIZINE HCL 10 MG PO TABS: 10.0000 mg | ORAL_TABLET | Freq: Every day | ORAL | 11 refills | Status: DC

## 2019-10-28 NOTE — Progress Notes (Signed)
Subjective:  Chief complaint follow-up for hepatitis B infection and need for preexposure prophylaxis   Patient ID: Jacob Ponce, male    DOB: [birth year], 79 y.o.   MRN: [id number]  HPI  Mr Jacob Ponce is a 79 year old man with DM, HTN, hyperlipidemia who contracted Hepatitis B last summer. He had had unprotected anal intercourse with another man in July 2020 and went for testing at the [facility] where he tested negative.   He then was hospitalized with pneumonia and found to have an acute elevation of his transaminases in August 2020 with his ALT being 511 and AST 165 and his HIV test was negative at that time.  Since then he has been seen by Dr. Matthias HughsBuccini who has performed serologies on the patient as recently as April 2021.  At that time his hepatitis B surface antigen remained positive his hepatitis B E antigen was positive his hepatitis B E antigen antibody was negative his surface antibody qualitative was negative and his hepatitis B core antibody was positive on IgM testing in total.  Tested negative for hepatitis C.  We checked hepatitis B viral load which was at 530,000 copies. Transaminases were relatively stable. I did feel that it would be prudent to initiate therapy given his age in particular for hepatitis B but to do so in the form of DESCOVY which would also serve him in providing preexposure prophylaxis.  He has been on DESCOVY since June 2021 and tolerated quite well.  As I last saw him he has been suffering from a pruritic rash that responded somewhat to topical triamcinolone.  He is also been prescribed permethrin.  He is having sex with a few other men.  When he has receptive anal intercourse is always with condoms.  He does have oral sex without protection.     Past Medical History:  Diagnosis Date  . BPH (benign prostatic hyperplasia)   . Diabetes mellitus    Type II, controlled  . Fracture 2011   Right proximal humerus, resolved w/o deficit  . HBV (hepatitis B virus)  infection   . History of cardiac cath 1999   and Tilt Table Test, both negative  . Hyperlipidemia   . Hypertension   . Impotence of organic origin   . Special screening for malignant neoplasms of other sites     Past Surgical History:  Procedure Laterality Date  . APPENDECTOMY    . TIBIA FRACTURE SURGERY     Left, resolved without deficit  . TONSILLECTOMY      Family History  Problem Relation Age of Onset  . Cancer Father        colon  . Seizures Father        after head injury  . Colon cancer Father   . Colon cancer Mother   . Arthritis Brother   . Prostate cancer Neg Hx       Social History   Socioeconomic History  . Marital status: Married    Spouse name: Not on file  . Number of children: 4  . Years of education: Not on file  . Highest education level: Not on file  Occupational History  . Occupation: Art gallery managerngineer at [employer]    Employer: [employer]  . Occupation: Occupational hygienistilot  Tobacco Use  . Smoking status: Former Smoker    Types: Cigarettes    Quit date: 02/05/1985    Years since quitting: 34.7  . Smokeless tobacco: Never Used  . Tobacco comment: Smoked from  1966 to 1987  Substance and Sexual Activity  . Alcohol use: No    Alcohol/week: 0.0 standard drinks  . Drug use: No  . Sexual activity: Never  Other Topics Concern  . Not on file  Social History Narrative   Undergrad and Masters at [employer].   Second marriage since 1987   4 children, 3 step-children   Flew small aircraft- retired from flying 2019   Working 3-4 days a week as of 2021   Social Determinants of Corporate investment banker Strain: Low Risk   . Difficulty of Paying Living Expenses: Not hard at all  Food Insecurity: No Food Insecurity  . Worried About Programme researcher, broadcasting/film/video in the Last Year: Never true  . Ran Out of Food in the Last Year: Never true  Transportation Needs: No Transportation Needs  . Lack of Transportation (Medical): No  . Lack of Transportation (Non-Medical): No  Physical  Activity: Sufficiently Active  . Days of Exercise per Week: 7 days  . Minutes of Exercise per Session: 60 min  Stress: No Stress Concern Present  . Feeling of Stress : Not at all  Social Connections:   . Frequency of Communication with Friends and Family: Not on file  . Frequency of Social Gatherings with Friends and Family: Not on file  . Attends Religious Services: Not on file  . Active Member of Clubs or Organizations: Not on file  . Attends Banker Meetings: Not on file  . Marital Status: Not on file    No Known Allergies   Current Outpatient Medications:  .  aspirin 81 MG tablet, Take 81 mg by mouth daily.  , Disp: , Rfl:  .  DESCOVY 200-25 MG tablet, TAKE 1 TABLET BY MOUTH  DAILY, Disp: 90 tablet, Rfl: 2 .  doxazosin (CARDURA) 8 MG tablet, Take 1 tablet (8 mg total) by mouth at bedtime., Disp: 90 tablet, Rfl: 3 .  glucose blood (ONETOUCH ULTRA) test strip, USE TO CHECK BLOOD SUGAR DAILY. DX: E11.9, Disp: 100 strip, Rfl: 3 .  Lancets (ONETOUCH ULTRASOFT) lancets, Use to check blood sugar daily.  Dx: E11.9, Disp: 100 each, Rfl: 3 .  loratadine (CLARITIN) 10 MG tablet, Take 10 mg by mouth daily as needed for allergies., Disp: , Rfl:  .  metFORMIN (GLUCOPHAGE) 500 MG tablet, TAKE 1 TABLET TWICE DAILY WITH A MEAL., Disp: 180 tablet, Rfl: 3 .  pioglitazone (ACTOS) 30 MG tablet, Take 0.5 tablets (15 mg total) by mouth daily., Disp: 45 tablet, Rfl: 3 .  simvastatin (ZOCOR) 40 MG tablet, Take 1 tablet (40 mg total) by mouth at bedtime., Disp: , Rfl:  .  cetirizine (ZYRTEC ALLERGY) 10 MG tablet, Take 1 tablet (10 mg total) by mouth daily., Disp: 30 tablet, Rfl: 11 .  permethrin (ELIMITE) 5 % cream, Apply topically. (Patient not taking: Reported on 10/28/2019), Disp: , Rfl:  .  tamsulosin (FLOMAX) 0.4 MG CAPS capsule, Take 1 capsule (0.4 mg total) by mouth daily. (Patient not taking: Reported on 10/28/2019), Disp: 90 capsule, Rfl: 3 .  triamcinolone ointment (KENALOG) 0.5 %, Apply  1 application topically 2 (two) times daily., Disp: 30 g, Rfl: 2   Review of Systems  Constitutional: Negative for activity change, appetite change, chills, diaphoresis, fatigue, fever and unexpected weight change.  HENT: Negative for congestion, rhinorrhea, sinus pressure, sneezing, sore throat and trouble swallowing.   Eyes: Negative for photophobia and visual disturbance.  Respiratory: Negative for cough, chest tightness, shortness of breath,  wheezing and stridor.   Cardiovascular: Negative for chest pain, palpitations and leg swelling.  Gastrointestinal: Negative for abdominal distention, abdominal pain, anal bleeding, blood in stool, constipation, diarrhea, nausea and vomiting.  Genitourinary: Negative for difficulty urinating, dysuria, flank pain and hematuria.  Musculoskeletal: Negative for arthralgias, back pain, gait problem, joint swelling and myalgias.  Skin: Positive for rash. Negative for color change, pallor and wound.  Neurological: Negative for dizziness, tremors, weakness and light-headedness.  Hematological: Negative for adenopathy. Does not bruise/bleed easily.  Psychiatric/Behavioral: Negative for agitation, behavioral problems, confusion, decreased concentration, dysphoric mood and sleep disturbance.       Objective:   Physical Exam Constitutional:      General: He is not in acute distress.    Appearance: Normal appearance. He is well-developed. He is not ill-appearing or diaphoretic.  HENT:     Head: Normocephalic and atraumatic.     Right Ear: Hearing and external ear normal.     Left Ear: Hearing and external ear normal.     Nose: No nasal deformity or rhinorrhea.  Eyes:     General: No scleral icterus.    Conjunctiva/sclera: Conjunctivae normal.     Right eye: Right conjunctiva is not injected.     Left eye: Left conjunctiva is not injected.     Pupils: Pupils are equal, round, and reactive to light.  Neck:     Vascular: No JVD.  Cardiovascular:     Rate  and Rhythm: Normal rate and regular rhythm.     Heart sounds: S1 normal and S2 normal.  Pulmonary:     Effort: Pulmonary effort is normal. No respiratory distress.  Abdominal:     General: There is no distension.     Palpations: Abdomen is soft.  Musculoskeletal:        General: Normal range of motion.     Right shoulder: Normal.     Left shoulder: Normal.     Cervical back: Normal range of motion and neck supple.     Right hip: Normal.     Left hip: Normal.     Right knee: Normal.     Left knee: Normal.  Lymphadenopathy:     Head:     Right side of head: No submandibular, preauricular or posterior auricular adenopathy.     Left side of head: No submandibular, preauricular or posterior auricular adenopathy.     Cervical: No cervical adenopathy.     Right cervical: No superficial or deep cervical adenopathy.    Left cervical: No superficial or deep cervical adenopathy.  Skin:    General: Skin is warm and dry.     Coloration: Skin is not pale.     Findings: Rash present. No abrasion, bruising, ecchymosis, erythema or lesion.     Nails: There is no clubbing.  Neurological:     Mental Status: He is alert and oriented to person, place, and time.     Sensory: No sensory deficit.     Coordination: Coordination normal.     Gait: Gait normal.  Psychiatric:        Attention and Perception: He is attentive.        Mood and Affect: Mood normal.        Speech: Speech normal.        Behavior: Behavior normal. Behavior is cooperative.        Thought Content: Thought content normal.        Judgment: Judgment normal.     Pruritic rash pictured  October 28, 2019:            Assessment & Plan:   Chronic hepatitis B without delta agent:  He is on DESCOVY we will check a CMP today.  He needs a second hepatitis A vaccine in December.  His ultrasound was negative for hepatocellular carcinoma.  Need for preexposure prophylaxis we will screen him for STIs including HIV syphilis  gonorrhea and chlamydia genital and extra genital sites.If remains HIV negative I will give him a 81-month rx of DESCOVY.  Riddick rash encouraged him to use Zyrtec we will prescribe topical triamcinolone at a higher dose and have referred him to dermatology with [facility].

## 2019-10-29 ENCOUNTER — Telehealth: Payer: Self-pay

## 2019-10-29 ENCOUNTER — Other Ambulatory Visit: Payer: Self-pay | Admitting: Infectious Disease

## 2019-10-29 DIAGNOSIS — B181 Chronic viral hepatitis B without delta-agent: Secondary | ICD-10-CM

## 2019-10-29 DIAGNOSIS — Z7251 High risk heterosexual behavior: Secondary | ICD-10-CM

## 2019-10-29 LAB — CYTOLOGY, (ORAL, ANAL, URETHRAL) ANCILLARY ONLY
Chlamydia: NEGATIVE
Chlamydia: NEGATIVE
Comment: NEGATIVE
Comment: NEGATIVE
Comment: NORMAL
Comment: NORMAL
Neisseria Gonorrhea: NEGATIVE
Neisseria Gonorrhea: NEGATIVE

## 2019-10-29 LAB — URINE CYTOLOGY ANCILLARY ONLY
Chlamydia: NEGATIVE
Comment: NEGATIVE
Comment: NORMAL
Neisseria Gonorrhea: NEGATIVE

## 2019-10-29 MED ORDER — DESCOVY 200-25 MG PO TABS: 1.0000 | ORAL_TABLET | Freq: Every day | ORAL | 0 refills | Status: DC

## 2019-10-29 NOTE — Telephone Encounter (Signed)
-----   Message from Randall Hiss, MD sent at 10/29/2019  1:23 PM EDT ----- HIV negative , sent 90 days of Descovy in

## 2019-10-29 NOTE — Telephone Encounter (Signed)
Contacted patient to notify him of lab results. Left HIPAA compliant message requesting call back.   Taven Strite Loyola Mast, RN

## 2019-11-02 LAB — HIV ANTIBODY (ROUTINE TESTING W REFLEX): HIV 1&2 Ab, 4th Generation: NONREACTIVE

## 2019-11-02 LAB — HEPATITIS B SURFACE ANTIGEN: Hepatitis B Surface Ag: REACTIVE — AB

## 2019-11-02 LAB — COMPLETE METABOLIC PANEL WITH GFR
AG Ratio: 1.5 (calc) (ref 1.0–2.5)
ALT: 43 U/L (ref 9–46)
AST: 36 U/L — ABNORMAL HIGH (ref 10–35)
Albumin: 4.1 g/dL (ref 3.6–5.1)
Alkaline phosphatase (APISO): 44 U/L (ref 35–144)
BUN/Creatinine Ratio: 29 (calc) — ABNORMAL HIGH (ref 6–22)
BUN: 27 mg/dL — ABNORMAL HIGH (ref 7–25)
CO2: 22 mmol/L (ref 20–32)
Calcium: 9.8 mg/dL (ref 8.6–10.3)
Chloride: 103 mmol/L (ref 98–110)
Creat: 0.93 mg/dL (ref 0.70–1.18)
GFR, Est African American: 91 mL/min/{1.73_m2} (ref >=60)
GFR, Est Non African American: 79 mL/min/{1.73_m2} (ref >=60)
Globulin: 2.7 g/dL (calc) (ref 1.9–3.7)
Glucose, Bld: 170 mg/dL — ABNORMAL HIGH (ref 65–99)
Potassium: 4.5 mmol/L (ref 3.5–5.3)
Sodium: 135 mmol/L (ref 135–146)
Total Bilirubin: 0.6 mg/dL (ref 0.2–1.2)
Total Protein: 6.8 g/dL (ref 6.1–8.1)

## 2019-11-02 LAB — RPR: RPR Ser Ql: NONREACTIVE

## 2019-11-02 LAB — HEPATITIS C ANTIBODY
Hepatitis C Ab: NONREACTIVE
SIGNAL TO CUT-OFF: 0.04 (ref <1.00)

## 2019-11-02 LAB — HEPATITIS DELTA VIRUS ANTIGEN: Hepatitis D Antigen: NOT DETECTED

## 2019-11-02 LAB — HEPATITIS B E ANTIBODY: Hep B E Ab: NONREACTIVE

## 2019-11-02 LAB — HEPATITIS B DNA, ULTRAQUANTITATIVE, PCR
Hepatitis B DNA (Calc): 3.4 Log IU/mL — ABNORMAL HIGH
Hepatitis B DNA: 2510 IU/mL — ABNORMAL HIGH

## 2019-11-02 LAB — HEPATITIS A ANTIBODY, TOTAL: Hepatitis A AB,Total: REACTIVE — AB

## 2019-11-04 ENCOUNTER — Ambulatory Visit: Payer: Medicare Other | Admitting: Infectious Disease

## 2019-11-21 ENCOUNTER — Ambulatory Visit: Payer: Medicare Other | Attending: Internal Medicine

## 2019-11-21 DIAGNOSIS — Z23 Encounter for immunization: Secondary | ICD-10-CM

## 2019-11-21 NOTE — Progress Notes (Signed)
   Covid-19 Vaccination Clinic  Name:  Jacob Ponce    MRN: [id number] DOB: Apr 18, 1940  11/21/2019  Jacob Ponce was observed post Covid-19 immunization for 15 minutes without incident. He was provided with Vaccine Information Sheet and instruction to access the V-Safe system.   Jacob Ponce was instructed to call 911 with any severe reactions post vaccine: Marland Kitchen Difficulty breathing  . Swelling of face and throat  . A fast heartbeat  . A bad rash all over body  . Dizziness and weakness

## 2019-11-26 ENCOUNTER — Other Ambulatory Visit: Payer: Self-pay | Admitting: Infectious Disease

## 2019-11-26 DIAGNOSIS — Z7251 High risk heterosexual behavior: Secondary | ICD-10-CM

## 2019-11-26 DIAGNOSIS — B181 Chronic viral hepatitis B without delta-agent: Secondary | ICD-10-CM

## 2019-11-29 ENCOUNTER — Encounter: Payer: Self-pay | Admitting: Family Medicine

## 2019-11-30 ENCOUNTER — Other Ambulatory Visit: Payer: Self-pay | Admitting: *Deleted

## 2019-11-30 MED ORDER — SIMVASTATIN 40 MG PO TABS
40.0000 mg | ORAL_TABLET | Freq: Every day | ORAL | 1 refills | Status: DC
Start: 2019-11-30 — End: 2020-04-12

## 2019-12-01 ENCOUNTER — Telehealth: Payer: Self-pay

## 2019-12-01 NOTE — Telephone Encounter (Signed)
Called to get patient a follow up scheduled sometime in December with provider, left voicemail to call us back and get that scheduled

## 2020-01-07 ENCOUNTER — Other Ambulatory Visit: Payer: Self-pay

## 2020-01-07 ENCOUNTER — Ambulatory Visit (INDEPENDENT_AMBULATORY_CARE_PROVIDER_SITE_OTHER): Payer: Medicare Other | Admitting: Family Medicine

## 2020-01-07 ENCOUNTER — Encounter: Payer: Self-pay | Admitting: Family Medicine

## 2020-01-07 VITALS — BP 138/64 | HR 78 | Temp 98.5°F | Ht 70.0 in | Wt 184.0 lb

## 2020-01-07 DIAGNOSIS — L989 Disorder of the skin and subcutaneous tissue, unspecified: Secondary | ICD-10-CM | POA: Diagnosis not present

## 2020-01-07 MED ORDER — DOXYCYCLINE HYCLATE 100 MG PO TABS: 100.0000 mg | ORAL_TABLET | Freq: Two times a day (BID) | ORAL | 0 refills | Status: DC

## 2020-01-07 NOTE — Patient Instructions (Signed)
Start the antibiotics and update me tomorrow.  We'll send out the wound culture.  If you have a fever, then go to the hospital.   Take care.  Glad to see you.   Check on getting more dressing supplies.   Address: [address] [place], [place] [address] Phone: [phone]

## 2020-01-07 NOTE — Progress Notes (Signed)
This visit occurred during the SARS-CoV-2 public health emergency.  Safety protocols were in place, including screening questions prior to the visit, additional usage of staff PPE, and extensive cleaning of exam room while observing appropriate contact time as indicated for disinfecting solutions.  Initial eval today for skin lesions.  Started at the R shoulder, about 10 days ago. Then lesion on the L abd wall.  Draining when larger.  Start as small ~1cm lesions, then the lesion on the R abd wall got bigger in the meantime with a draining yellow center.  No others with similar.  No FCNAVD.  Still on HBV suppression.  Locally ttp.  He had been using neosporin and dressing them.    He is on his baseline antiviral medications, see med list.  Meds, vitals, and allergies reviewed.   ROS: Per HPI unless specifically indicated in ROS section   nad ncat Neck supple, no LA rrr ctab Skin with multiple lesions noted.  Skin is well perfused. Lesions on R upper back - 1cm, minimally irritated.  No ulceration. R shoulder - 1cm, minimally irritated no ulceration R elbow x2 - 1cm each, minimal irritated, no ulceration. R abd wall - 4x2cm ulcer with slough and 6x9cm erythema surrounding the ulcer. L lower abd wall  - 2x3cm ulcer with 3x5cm surrounding erthema  At least 30 minutes were devoted to patient care in this encounter (this can include time spent reviewing the patient's file/history, interviewing and examining the patient, counseling/reviewing plan with patient, ordering referrals, ordering tests, reviewing relevant laboratory or x-ray data, and documenting the encounter).

## 2020-01-08 ENCOUNTER — Encounter: Payer: Self-pay | Admitting: Family Medicine

## 2020-01-10 DIAGNOSIS — R21 Rash and other nonspecific skin eruption: Secondary | ICD-10-CM | POA: Insufficient documentation

## 2020-01-10 DIAGNOSIS — L989 Disorder of the skin and subcutaneous tissue, unspecified: Secondary | ICD-10-CM | POA: Insufficient documentation

## 2020-01-10 LAB — WOUND CULTURE
MICRO NUMBER:: 11269038
SPECIMEN QUALITY:: ADEQUATE

## 2020-01-10 NOTE — Assessment & Plan Note (Signed)
Unclear source.  Already on Descovy and compliant.  Will start doxycycline.  Routine cautions given to patient.  No fevers.  Still okay for outpatient follow-up.  Will update ID clinic in the meantime.  He agrees with plan.  Wounds dressed at office visit.

## 2020-01-14 ENCOUNTER — Other Ambulatory Visit: Payer: Self-pay | Admitting: Family Medicine

## 2020-02-01 DIAGNOSIS — H16223 Keratoconjunctivitis sicca, not specified as Sjogren's, bilateral: Secondary | ICD-10-CM | POA: Diagnosis not present

## 2020-02-01 DIAGNOSIS — H35363 Drusen (degenerative) of macula, bilateral: Secondary | ICD-10-CM | POA: Diagnosis not present

## 2020-02-01 DIAGNOSIS — H3554 Dystrophies primarily involving the retinal pigment epithelium: Secondary | ICD-10-CM | POA: Diagnosis not present

## 2020-02-01 DIAGNOSIS — H353132 Nonexudative age-related macular degeneration, bilateral, intermediate dry stage: Secondary | ICD-10-CM | POA: Diagnosis not present

## 2020-02-02 ENCOUNTER — Encounter: Payer: Self-pay | Admitting: Family Medicine

## 2020-02-03 ENCOUNTER — Other Ambulatory Visit: Payer: Self-pay | Admitting: Family Medicine

## 2020-02-10 ENCOUNTER — Other Ambulatory Visit (HOSPITAL_COMMUNITY)
Admission: RE | Admit: 2020-02-10 | Discharge: 2020-02-10 | Disposition: A | Discharge status: Home / Self Care | Payer: Medicare Other | Source: Ambulatory Visit | Attending: Infectious Disease | Admitting: Infectious Disease

## 2020-02-10 ENCOUNTER — Other Ambulatory Visit: Payer: Self-pay

## 2020-02-10 ENCOUNTER — Ambulatory Visit: Payer: Medicare Other | Admitting: Infectious Disease

## 2020-02-10 ENCOUNTER — Encounter: Payer: Self-pay | Admitting: Infectious Disease

## 2020-02-10 VITALS — Resp 16 | Ht 70.0 in | Wt 184.0 lb

## 2020-02-10 DIAGNOSIS — Z23 Encounter for immunization: Secondary | ICD-10-CM

## 2020-02-10 DIAGNOSIS — L282 Other prurigo: Secondary | ICD-10-CM | POA: Diagnosis not present

## 2020-02-10 DIAGNOSIS — B181 Chronic viral hepatitis B without delta-agent: Secondary | ICD-10-CM | POA: Insufficient documentation

## 2020-02-10 DIAGNOSIS — Z79899 Other long term (current) drug therapy: Secondary | ICD-10-CM

## 2020-02-10 HISTORY — DX: Other long term (current) drug therapy: Z79.899

## 2020-02-10 MED ORDER — EMTRICITABINE-TENOFOVIR DF 200-300 MG PO TABS: 1.0000 | ORAL_TABLET | Freq: Every day | ORAL | 2 refills | Status: DC

## 2020-02-10 NOTE — Progress Notes (Signed)
Subjective:  Chief complaint follow-up : intense rash  Patient ID: Jacob Ponce, male    DOB: 08-Mar-1940, 80 y.o.   MRN: [id number]  HPI  Jacob Ponce is a 80 year old man with DM, HTN, hyperlipidemia who contracted Hepatitis B last summer. He had had unprotected anal intercourse with another man in July 2020 and went for testing at the [facility] where he tested negative.   He then was hospitalized with pneumonia and found to have an acute elevation of his transaminases in August 2020 with his ALT being 511 and AST 165 and his HIV test was negative at that time.  Since then he has been seen by Dr. Matthias Ponce who has performed serologies on the patient as recently as April 2021.  At that time his hepatitis B surface antigen remained positive his hepatitis B E antigen was positive his hepatitis B E antigen antibody was negative his surface antibody qualitative was negative and his hepatitis B core antibody was positive on IgM testing in total.  Tested negative for hepatitis C.  We checked hepatitis B viral load which was at 530,000 copies. Transaminases were relatively stable. I did feel that it would be prudent to initiate therapy given his age in particular for hepatitis B but to do so in the form of DESCOVY which would also serve him in providing preexposure prophylaxis.  He has been on DESCOVY since June 2021 and had seemed to tolerated quite well.   In the interim he began suffering from a pruritic rash that responded somewhat to topical triamcinolone.  He is also was prescribed permethrin.  He has not responded to antihistamines and topical corticosteroids I referred him to dermatology but he says his appointment with them was not scheduled until late January when he made it back in October.   These areas became quite excoriated and he was treated with doxycycline by Dr. Para Ponce his PCP Dr. Rexene Ponce also obtained a culture which yielded group A streptococcus.  Patient's rash did get a little  bit better while on doxy.  However he continues to have intense itching and still has presence of the rash.  At this point in time I think it is prudent to trial him off of DESCOVY.  In terms of HIV prevention IM APRETUDE q 2 months should soon by an option been approved by the FDA roughly a month ago.  However it would not also cover his hepatitis B.  Questions with our infectious disease pharmacy Jacob Ponce (who also runs our PrEP clinic) she suggested switch to Truvada as option.   Past Medical History:  Diagnosis Date  . BPH (benign prostatic hyperplasia)   . Diabetes mellitus    Type II, controlled  . Fracture 2011   Right proximal humerus, resolved w/o deficit  . HBV (hepatitis B virus) infection   . History of cardiac cath 1999   and Tilt Table Test, both negative  . Hyperlipidemia   . Hypertension   . Impotence of organic origin   . Special screening for malignant neoplasms of other sites     Past Surgical History:  Procedure Laterality Date  . APPENDECTOMY    . TIBIA FRACTURE SURGERY     Left, resolved without deficit  . TONSILLECTOMY      Family History  Problem Relation Age of Onset  . Cancer Father        colon  . Seizures Father        after head injury  .  Colon cancer Father   . Colon cancer Mother   . Arthritis Brother   . Prostate cancer Neg Hx       Social History   Socioeconomic History  . Marital status: Married    Spouse name: Not on file  . Number of children: 4  . Years of education: Not on file  . Highest education level: Not on file  Occupational History  . Occupation: Chief Financial Officer at [employer]: Jacob Ponce  . Occupation: Insurance underwriter  Tobacco Use  . Smoking status: Former Smoker    Types: Cigarettes    Quit date: 02/05/1985    Years since quitting: 35.0  . Smokeless tobacco: Never Used  . Tobacco comment: Smoked from 1966 to 1987  Substance and Sexual Activity  . Alcohol use: No    Alcohol/week: 0.0 standard drinks   . Drug use: No  . Sexual activity: Never  Other Topics Concern  . Not on file  Social History Narrative   Undergrad and Masters at [employer].   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 Difficulty of Paying Living Expenses: Not hard at all  Food Insecurity: No Food Insecurity  . Worried About Charity fundraiser in the Last Year: Never true  . Ran Out of Food in the Last Year: Never true  Transportation Needs: No Transportation Needs  . Lack of Transportation (Medical): No  . Lack of Transportation (Non-Medical): No  Physical Activity: Sufficiently Active  . Days of Exercise per Week: 7 days  . Minutes of Exercise per Session: 60 min  Stress: No Stress Concern Present  . Feeling of Stress : Not at all  Social Connections: Not on file    Allergies  Allergen Reactions  . Tamsulosin     Would avoid based on ophthalmology recommendation.     Current Outpatient Medications:  .  aspirin 81 MG tablet, Take 81 mg by mouth daily., Disp: , Rfl:  .  doxazosin (CARDURA) 8 MG tablet, Take 1 tablet (8 mg total) by mouth at bedtime., Disp: 90 tablet, Rfl: 3 .  emtricitabine-tenofovir (TRUVADA) 200-300 MG tablet, Take 1 tablet by mouth daily., Disp: 30 tablet, Rfl: 2 .  glucose blood (ONETOUCH ULTRA) test strip, USE TO CHECK BLOOD SUGAR DAILY. DX: E11.9, Disp: 100 strip, Rfl: 3 .  Lancets (ONETOUCH ULTRASOFT) lancets, Use to check blood sugar daily.  Dx: E11.9, Disp: 100 each, Rfl: 3 .  loratadine (CLARITIN) 10 MG tablet, Take 10 mg by mouth daily as needed for allergies., Disp: , Rfl:  .  metFORMIN (GLUCOPHAGE) 500 MG tablet, TAKE 1 TABLET BY MOUTH  TWICE DAILY WITH MEALS, Disp: 180 tablet, Rfl: 3 .  permethrin (ELIMITE) 5 % cream, Apply topically., Disp: , Rfl:  .  pioglitazone (ACTOS) 30 MG tablet, Take 0.5  tablets (15 mg total) by mouth daily., Disp: 45 tablet, Rfl: 3 .  simvastatin (ZOCOR) 40 MG tablet, Take 1 tablet (40 mg total) by mouth at bedtime., Disp: 90 tablet, Rfl: 1 .  triamcinolone ointment (KENALOG) 0.5 %, Apply 1 application topically 2 (two) times daily., Disp: 30 g, Rfl: 2 .  cetirizine (ZYRTEC ALLERGY) 10 MG tablet, Take 1 tablet (10 mg total) by mouth daily., Disp: 30 tablet, Rfl: 11  Review of Systems  Constitutional: Negative for activity change, appetite change, chills, diaphoresis, fatigue, fever and unexpected weight change.  HENT: Negative for congestion, rhinorrhea, sinus pressure, sneezing, sore throat and trouble swallowing.   Eyes: Negative for photophobia and visual disturbance.  Respiratory: Negative for cough, chest tightness, shortness of breath, wheezing and stridor.   Cardiovascular: Negative for chest pain, palpitations and leg swelling.  Gastrointestinal: Negative for abdominal distention, abdominal pain, anal bleeding, blood in stool, constipation, diarrhea, nausea and vomiting.  Genitourinary: Negative for difficulty urinating, dysuria, flank pain and hematuria.  Musculoskeletal: Negative for arthralgias, back pain, gait problem, joint swelling and myalgias.  Skin: Positive for rash and wound. Negative for color change and pallor.  Neurological: Negative for dizziness, tremors, weakness and light-headedness.  Hematological: Negative for adenopathy. Does not bruise/bleed easily.  Psychiatric/Behavioral: Negative for agitation, behavioral problems, confusion, decreased concentration, dysphoric mood and sleep disturbance.       Objective:   Physical Exam Constitutional:      General: He is not in acute distress.    Appearance: Normal appearance. He is well-developed. He is not ill-appearing or diaphoretic.  HENT:     Head: Normocephalic and atraumatic.     Right Ear: Hearing and external ear normal.     Left Ear: Hearing and external ear normal.      Nose: No nasal deformity or rhinorrhea.  Eyes:     General: No scleral icterus.    Conjunctiva/sclera: Conjunctivae normal.     Right eye: Right conjunctiva is not injected.     Left eye: Left conjunctiva is not injected.     Pupils: Pupils are equal, round, and reactive to light.  Neck:     Vascular: No JVD.  Cardiovascular:     Rate and Rhythm: Normal rate and regular rhythm.     Heart sounds: S1 normal and S2 normal.  Pulmonary:     Effort: Pulmonary effort is normal. No respiratory distress.  Abdominal:     General: There is no distension.     Palpations: Abdomen is soft.  Musculoskeletal:        General: Normal range of motion.     Right shoulder: Normal.     Left shoulder: Normal.     Cervical back: Normal range of motion and neck supple.     Right hip: Normal.     Left hip: Normal.     Right knee: Normal.     Left knee: Normal.  Lymphadenopathy:     Head:     Right side of head: No submandibular, preauricular or posterior auricular adenopathy.     Left side of head: No submandibular, preauricular or posterior auricular adenopathy.     Cervical: No cervical adenopathy.     Right cervical: No superficial or deep cervical adenopathy.    Left cervical: No superficial or deep cervical adenopathy.  Skin:    General: Skin is warm and dry.     Coloration: Skin is not pale.     Findings: Rash present. No abrasion, bruising, ecchymosis, erythema or lesion.     Nails: There is no clubbing.  Neurological:     Mental Status: He is alert and oriented to person, place, and time.     Sensory: No sensory deficit.     Coordination: Coordination normal.     Gait: Gait normal.  Psychiatric:        Attention and Perception: He is attentive.        Mood and Affect: Mood  normal.        Speech: Speech normal.        Behavior: Behavior normal. Behavior is cooperative.        Thought Content: Thought content normal.        Judgment: Judgment normal.     Pruritic rash pictured  October 28, 2019:       Areas of excoriation pictured today February 10, 2020:           Assessment & Plan:   Chronic hepatitis B without delta agent:  He has been on DESCOVY will check CMP today.  My plan is to trial him off of DESCOVY I do not think that a few weeks off of this will make his hepatitis B flare terribly much.   He received a second hepatitis A vaccine today  His ultrasound was negative for hepatocellular carcinoma.  Need for preexposure prophylaxis we will screen him for STIs including HIV syphilis gonorrhea and chlamydia genital and extra genital sites.  Now my plan is to watch him off DESCOVY and then start him on Truvada.  If his rash goes away and itching goes away on DESCOVY we know that is the culprit and that we can challenge him with Truvada.  He tolerates that then that will help take care of both preexposure prophylaxis and hepatitis B.   Pruritic rash: Again recommend trial him off of DESCOVY also checking a CBC with a differential as I do not see that one was checked in the last several months.  Hopefully his rash resolves off the DESCOVY and then if we challenged with Truvada will be a problem if however he continues to have the rash while off DESCOVY that he will need further investigation.  If he develops rash on challenge with Truvada will have to look at other options for HIV prevention and consider other options for hepatitis B treatment versus observation.  I spent greater than 40 minutes with the patient including greater than 50%  counsel of the patient re his rash, differential his HBV and HIV PrEP and in coordination of his care.

## 2020-02-10 NOTE — Addendum Note (Signed)
Addended by: Clayborne Artist A on: 02/10/2020 10:31 AM   Modules accepted: Orders

## 2020-02-11 ENCOUNTER — Other Ambulatory Visit: Payer: Self-pay | Admitting: Infectious Disease

## 2020-02-11 LAB — URINE CYTOLOGY ANCILLARY ONLY
Chlamydia: NEGATIVE
Comment: NEGATIVE
Comment: NORMAL
Neisseria Gonorrhea: NEGATIVE

## 2020-02-11 LAB — CBC WITH DIFFERENTIAL/PLATELET
Absolute Monocytes: 612 cells/uL (ref 200–950)
Basophils Absolute: 27 cells/uL (ref 0–200)
Basophils Relative: 0.4 %
Eosinophils Absolute: 340 cells/uL (ref 15–500)
Eosinophils Relative: 5 %
HCT: 41.2 % (ref 38.5–50.0)
Hemoglobin: 14 g/dL (ref 13.2–17.1)
Lymphs Abs: 1027 cells/uL (ref 850–3900)
MCH: 30.8 pg (ref 27.0–33.0)
MCHC: 34 g/dL (ref 32.0–36.0)
MCV: 90.5 fL (ref 80.0–100.0)
MPV: 10.1 fL (ref 7.5–12.5)
Monocytes Relative: 9 %
Neutro Abs: 4794 cells/uL (ref 1500–7800)
Neutrophils Relative %: 70.5 %
Platelets: 411 10*3/uL — ABNORMAL HIGH (ref 140–400)
RBC: 4.55 10*6/uL (ref 4.20–5.80)
RDW: 14.3 % (ref 11.0–15.0)
Total Lymphocyte: 15.1 %
WBC: 6.8 10*3/uL (ref 3.8–10.8)

## 2020-02-11 LAB — SEDIMENTATION RATE: Sed Rate: 6 mm/h (ref 0–20)

## 2020-02-11 LAB — COMPLETE METABOLIC PANEL WITH GFR
AG Ratio: 1.5 (calc) (ref 1.0–2.5)
ALT: 43 U/L (ref 9–46)
AST: 35 U/L (ref 10–35)
Albumin: 4.1 g/dL (ref 3.6–5.1)
Alkaline phosphatase (APISO): 42 U/L (ref 35–144)
BUN/Creatinine Ratio: 30 (calc) — ABNORMAL HIGH (ref 6–22)
BUN: 31 mg/dL — ABNORMAL HIGH (ref 7–25)
CO2: 24 mmol/L (ref 20–32)
Calcium: 10.2 mg/dL (ref 8.6–10.3)
Chloride: 103 mmol/L (ref 98–110)
Creat: 1.04 mg/dL (ref 0.70–1.18)
GFR, Est African American: 80 mL/min/{1.73_m2} (ref >=60)
GFR, Est Non African American: 69 mL/min/{1.73_m2} (ref >=60)
Globulin: 2.7 g/dL (calc) (ref 1.9–3.7)
Glucose, Bld: 194 mg/dL — ABNORMAL HIGH (ref 65–99)
Potassium: 4.1 mmol/L (ref 3.5–5.3)
Sodium: 136 mmol/L (ref 135–146)
Total Bilirubin: 0.7 mg/dL (ref 0.2–1.2)
Total Protein: 6.8 g/dL (ref 6.1–8.1)

## 2020-02-11 LAB — CYTOLOGY, (ORAL, ANAL, URETHRAL) ANCILLARY ONLY
Chlamydia: NEGATIVE
Chlamydia: NEGATIVE
Comment: NEGATIVE
Comment: NORMAL
Comment: NEGATIVE
Comment: NORMAL
Neisseria Gonorrhea: NEGATIVE
Neisseria Gonorrhea: NEGATIVE

## 2020-02-11 LAB — HIV ANTIBODY (ROUTINE TESTING W REFLEX): HIV 1&2 Ab, 4th Generation: NONREACTIVE

## 2020-02-11 LAB — C-REACTIVE PROTEIN: CRP: 1.2 mg/L (ref <8.0)

## 2020-02-11 LAB — RPR: RPR Ser Ql: NONREACTIVE

## 2020-03-10 ENCOUNTER — Other Ambulatory Visit: Payer: Self-pay | Admitting: Family Medicine

## 2020-03-25 ENCOUNTER — Telehealth: Payer: Self-pay | Admitting: Family Medicine

## 2020-03-25 NOTE — Progress Notes (Signed)
°  Chronic Care Management   Outreach Note  03/25/2020 Name: Jacob Ponce MRN: [id number] DOB: Jun 25, 1940  Referred by: Joaquim Nam, MD Reason for referral : Chronic Care Management   An unsuccessful telephone outreach was attempted today. The patient was referred to the pharmacist for assistance with care management and care coordination.   Follow Up Plan:   Jacob Ponce  Upstream Scheduler

## 2020-04-06 ENCOUNTER — Telehealth: Payer: Self-pay | Admitting: Family Medicine

## 2020-04-06 NOTE — Chronic Care Management (AMB) (Signed)
  Chronic Care Management   Note  04/06/2020 Name: Jacob Ponce MRN: [id number] DOB: 1940/02/08  Jacob Ponce is a 80 y.o. year old male who is a primary care patient of Joaquim Nam, MD. I reached out to Jacob Ponce by phone today in response to a referral sent by [name]'s PCP, Joaquim Nam, MD.   [name] was given information about Chronic Care Management services today including:  1. CCM service includes personalized support from designated clinical staff supervised by his physician, including individualized plan of care and coordination with other care providers 2. 24/7 contact phone numbers for assistance for urgent and routine care needs. 3. Service will only be billed when office clinical staff spend 20 minutes or more in a month to coordinate care. 4. Only one practitioner may furnish and bill the service in a calendar month. 5. The patient may stop CCM services at any time (effective at the end of the month) by phone call to the office staff.   Patient agreed to services and verbal consent obtained.   Follow up plan:   Aggie Hacker  Upstream Scheduler

## 2020-04-11 ENCOUNTER — Other Ambulatory Visit: Payer: Self-pay | Admitting: Family Medicine

## 2020-04-26 ENCOUNTER — Encounter: Payer: Self-pay | Admitting: Family Medicine

## 2020-05-11 ENCOUNTER — Telehealth: Payer: Self-pay

## 2020-05-11 ENCOUNTER — Other Ambulatory Visit (HOSPITAL_COMMUNITY)
Admission: RE | Admit: 2020-05-11 | Discharge: 2020-05-11 | Disposition: A | Discharge status: Home / Self Care | Payer: Medicare Other | Source: Ambulatory Visit | Attending: Infectious Disease | Admitting: Infectious Disease

## 2020-05-11 ENCOUNTER — Other Ambulatory Visit: Payer: Self-pay

## 2020-05-11 ENCOUNTER — Ambulatory Visit: Payer: Medicare Other | Admitting: Infectious Disease

## 2020-05-11 VITALS — BP 165/83 | HR 57 | Temp 98.1°F | Wt 188.0 lb

## 2020-05-11 DIAGNOSIS — Z79899 Other long term (current) drug therapy: Secondary | ICD-10-CM | POA: Diagnosis not present

## 2020-05-11 DIAGNOSIS — R21 Rash and other nonspecific skin eruption: Secondary | ICD-10-CM | POA: Insufficient documentation

## 2020-05-11 DIAGNOSIS — B181 Chronic viral hepatitis B without delta-agent: Secondary | ICD-10-CM

## 2020-05-11 NOTE — Progress Notes (Signed)
Subjective:  Chief complaint follow-up : Abdominal discomfort  Patient ID: Jacob Ponce, male    DOB: May 09, 1940, 80 y.o.   MRN: [id number]  HPI  Jacob Ponce is a 80 year old man with DM, HTN, hyperlipidemia who contracted Hepatitis B last summer. He had had unprotected anal intercourse with another man in July 2020 and went for testing at the [facility] where he tested negative.   He then was hospitalized with pneumonia and found to have an acute elevation of his transaminases in August 2020 with his ALT being 511 and AST 165 and his HIV test was negative at that time.  Since then he has been seen by Jacob Ponce who has performed serologies on the patient as recently as April 2021.  At that time his hepatitis B surface antigen remained positive his hepatitis B E antigen was positive his hepatitis B E antigen antibody was negative his surface antibody qualitative was negative and his hepatitis B core antibody was positive on IgM testing in total.  Tested negative for hepatitis C.  We checked hepatitis B viral load which was at 530,000 copies. Transaminases were relatively stable. I did feel that it would be prudent to initiate therapy given his age in particular for hepatitis B but to do so in the form of DESCOVY which would also serve him in providing preexposure prophylaxis.  He has been on DESCOVY since June 2021 and had seemed to tolerated quite well.   In the interim he began suffering from a pruritic rash that responded somewhat to topical triamcinolone.  He is also was prescribed permethrin.  He had not responded to antihistamines and topical corticosteroids I referred him to dermatology but he says his appointment with them was not scheduled until late January when he made it back in October.   These areas became quite excoriated and he was treated with doxycycline by Jacob Ponce his PCP Jacob Ponce also obtained a culture which yielded group A streptococcus.  Patient's rash did get a  little bit better while on doxy.  However he continued to have intense itching and still has presence of the rash.  At this point in time I think it is prudent to trial him off of DESCOVY.  Due to concerns that the Descovy might be causing the rash switch him over to Truvada.  I was under the impression that the rash had improved after he had come off DESCOVY with the other intervention as these began to apply topical Goldbond to his arms and he has had improvement in the rash.  [facility] but he was quite frustrated with trying to even see them as he could not see them for months and they would not take his insurance.  Was complaining of some abdominal discomfort.  We reviewed the different benefits and risks to both the Truvada and DESCOVY and he would prefer to trial DESCOVY again.  Another option in terms of preexposure prophylaxis would be Apretude IM, this would not cover his hepatitis B we could use another agent potentially such as entecavir to try to cover the hep B  Past Medical History:  Diagnosis Date  . BPH (benign prostatic hyperplasia)   . Diabetes mellitus    Type II, controlled  . Fracture 2011   Right proximal humerus, resolved w/o deficit  . HBV (hepatitis B virus) infection   . History of cardiac cath 1999   and Tilt Table Test, both negative  .  Hyperlipidemia   . Hypertension   . Impotence of organic origin   . On pre-exposure prophylaxis for HIV 02/10/2020  . Special screening for malignant neoplasms of other sites     Past Surgical History:  Procedure Laterality Date  . APPENDECTOMY    . TIBIA FRACTURE SURGERY     Left, resolved without deficit  . TONSILLECTOMY      Family History  Problem Relation Age of Onset  . Cancer Father        colon  . Seizures Father        after head injury  . Colon cancer Father   . Colon cancer Mother   . Arthritis Brother   . Prostate cancer Neg Hx       Social History    Socioeconomic History  . Marital status: Married    Spouse name: Not on file  . Number of children: 4  . Years of education: Not on file  . Highest education level: Not on file  Occupational History  . Occupation: Art gallery manager at [employer]    Employer: [employer]  . Occupation: Occupational hygienist  Tobacco Use  . Smoking status: Former Smoker    Types: Cigarettes    Quit date: 02/05/1985    Years since quitting: 35.2  . Smokeless tobacco: Never Used  . Tobacco comment: Smoked from 1966 to 1987  Substance and Sexual Activity  . Alcohol use: No    Alcohol/week: 0.0 standard drinks  . Drug use: No  . Sexual activity: Never  Other Topics Concern  . Not on file  Social History Narrative   Undergrad and Masters at [employer].   Second marriage since 1987   4 children, 3 step-children   Flew small aircraft- retired from flying 2019   Working 3-4 days a week as of 2021   Social Determinants of Corporate investment banker Strain: Low Risk   . Difficulty of Paying Living Expenses: Not hard at all  Food Insecurity: No Food Insecurity  . Worried About Programme researcher, broadcasting/film/video in the Last Year: Never true  . Ran Out of Food in the Last Year: Never true  Transportation Needs: No Transportation Needs  . Lack of Transportation (Medical): No  . Lack of Transportation (Non-Medical): No  Physical Activity: Sufficiently Active  . Days of Exercise per Week: 7 days  . Minutes of Exercise per Session: 60 min  Stress: No Stress Concern Present  . Feeling of Stress : Not at all  Social Connections: Not on file    Allergies  Allergen Reactions  . Tamsulosin     Would avoid based on ophthalmology recommendation.     Current Outpatient Medications:  .  aspirin 81 MG tablet, Take 81 mg by mouth daily., Disp: , Rfl:  .  doxazosin (CARDURA) 8 MG tablet, Take 1 tablet (8 mg total) by mouth at bedtime., Disp: 90 tablet, Rfl: 3 .  emtricitabine-tenofovir (TRUVADA) 200-300 MG tablet, Take 1 tablet by mouth daily.,  Disp: 30 tablet, Rfl: 2 .  glucose blood (ONETOUCH ULTRA) test strip, USE TO CHECK BLOOD SUGAR DAILY. DX: E11.9, Disp: 100 strip, Rfl: 3 .  Lancets (ONETOUCH ULTRASOFT) lancets, USE TO CHECK BLOOD SUGAR  DAILY, Disp: 100 each, Rfl: 3 .  loratadine (CLARITIN) 10 MG tablet, Take 10 mg by mouth daily as needed for allergies., Disp: , Rfl:  .  metFORMIN (GLUCOPHAGE) 500 MG tablet, TAKE 1 TABLET BY MOUTH  TWICE DAILY WITH MEALS, Disp: 180 tablet,  Rfl: 3 .  pioglitazone (ACTOS) 30 MG tablet, Take 0.5 tablets (15 mg total) by mouth daily., Disp: 45 tablet, Rfl: 3 .  simvastatin (ZOCOR) 40 MG tablet, TAKE 1 TABLET BY MOUTH AT  BEDTIME, Disp: 90 tablet, Rfl: 3 .  triamcinolone ointment (KENALOG) 0.5 %, Apply 1 application topically 2 (two) times daily., Disp: 30 g, Rfl: 2 .  cetirizine (ZYRTEC ALLERGY) 10 MG tablet, Take 1 tablet (10 mg total) by mouth daily., Disp: 30 tablet, Rfl: 11 .  permethrin (ELIMITE) 5 % cream, Apply topically., Disp: , Rfl:    Review of Systems  Constitutional: Negative for activity change, appetite change, chills, diaphoresis, fatigue, fever and unexpected weight change.  HENT: Negative for congestion, rhinorrhea, sinus pressure, sneezing, sore throat and trouble swallowing.   Eyes: Negative for photophobia and visual disturbance.  Respiratory: Negative for cough, chest tightness, shortness of breath, wheezing and stridor.   Cardiovascular: Negative for chest pain, palpitations and leg swelling.  Gastrointestinal: Positive for abdominal pain. Negative for abdominal distention, anal bleeding, blood in stool, constipation, diarrhea, nausea and vomiting.  Genitourinary: Negative for difficulty urinating, dysuria, flank pain and hematuria.  Musculoskeletal: Negative for arthralgias, back pain, gait problem, joint swelling and myalgias.  Skin: Positive for rash. Negative for color change and pallor.  Neurological: Negative for dizziness, tremors, weakness and light-headedness.   Hematological: Negative for adenopathy. Does not bruise/bleed easily.  Psychiatric/Behavioral: Negative for agitation, behavioral problems, confusion, decreased concentration, dysphoric mood and sleep disturbance.       Objective:   Physical Exam Constitutional:      General: He is not in acute distress.    Appearance: Normal appearance. He is well-developed. He is not ill-appearing or diaphoretic.  HENT:     Head: Normocephalic and atraumatic.     Right Ear: Hearing and external ear normal.     Left Ear: Hearing and external ear normal.     Nose: No nasal deformity or rhinorrhea.  Eyes:     General: No scleral icterus.    Conjunctiva/sclera: Conjunctivae normal.     Right eye: Right conjunctiva is not injected.     Left eye: Left conjunctiva is not injected.     Pupils: Pupils are equal, round, and reactive to light.  Neck:     Vascular: No JVD.  Cardiovascular:     Rate and Rhythm: Normal rate and regular rhythm.     Heart sounds: S1 normal and S2 normal.  Pulmonary:     Effort: Pulmonary effort is normal. No respiratory distress.  Abdominal:     General: There is no distension.     Palpations: Abdomen is soft.  Musculoskeletal:        General: Normal range of motion.     Right shoulder: Normal.     Left shoulder: Normal.     Cervical back: Normal range of motion and neck supple.     Right hip: Normal.     Left hip: Normal.     Right knee: Normal.     Left knee: Normal.  Lymphadenopathy:     Head:     Right side of head: No submandibular, preauricular or posterior auricular adenopathy.     Left side of head: No submandibular, preauricular or posterior auricular adenopathy.     Cervical: No cervical adenopathy.     Right cervical: No superficial or deep cervical adenopathy.    Left cervical: No superficial or deep cervical adenopathy.  Skin:    General: Skin is warm  and dry.     Coloration: Skin is not pale.     Findings: Rash present. No abrasion, bruising,  ecchymosis, erythema or lesion.     Nails: There is no clubbing.  Neurological:     Mental Status: He is alert and oriented to person, place, and time.     Sensory: No sensory deficit.     Coordination: Coordination normal.     Gait: Gait normal.  Psychiatric:        Attention and Perception: He is attentive.        Mood and Affect: Mood normal.        Speech: Speech normal.        Behavior: Behavior normal. Behavior is cooperative.        Thought Content: Thought content normal.        Judgment: Judgment normal.     Pruritic rash pictured October 28, 2019:       Areas of excoriationJanuary 5, 2022:      Arm today 05/11/2020:         Assessment & Plan:   Chronic hepatitis B without delta agent:  We will check hepatitis B DNA.  Is request CMP His ultrasound was negative for hepatocellular carcinoma.  He would like to retry DESCOVY  Need for preexposure prophylaxis we will screen him for STIs including HIV syphilis gonorrhea and chlamydia genital and extra genital sites we will rechallenge with Descovy after HIV by test comes back.  Note we also sent HIV RNA quant as per new CDC guidelines requiring RNA testing on those who are on preexposure prophylaxis.  Pruritic rash: Really not sure what is causing this and it would be great if he could actually see a dermatologist he is tolerating this much better now with the Goldbond.  Since it is not a severe rash I feel like rechallenge with DESCOVY is reasonable.   I spent greater than 30 minutes with the patient including greater than 50% of time in face to face counsel of the patient and in coordination of his care.

## 2020-05-11 NOTE — Chronic Care Management (AMB) (Addendum)
Chronic Care Management Pharmacy Assistant   Name: Jacob Ponce  MRN: [id number] DOB: Sep 26, 1940  Reason for Encounter: Initial Questions for 05/16/20 CCM visit   Conditions to be addressed/monitored: HTN, HLD and DMII  Recent office visits:  01/07/20- Dr. Para Ponce- PCP - Started short term doxycycline 100 mg  Recent consult visits:  02/10/20- Dr. Paulette Blanch Ponce- Infectious Disease- Started Truvada 200-300 mg 01/26/20- Dr. Arlyss Ponce- Ophthalmology   [facility] visits:  None in previous 6 months  Medications: Outpatient Encounter Medications as of 05/11/2020  Medication Sig Note   aspirin 81 MG tablet Take 81 mg by mouth daily.    cetirizine (ZYRTEC ALLERGY) 10 MG tablet Take 1 tablet (10 mg total) by mouth daily.    doxazosin (CARDURA) 8 MG tablet Take 1 tablet (8 mg total) by mouth at bedtime.    emtricitabine-tenofovir (TRUVADA) 200-300 MG tablet Take 1 tablet by mouth daily.    glucose blood (ONETOUCH ULTRA) test strip USE TO CHECK BLOOD SUGAR DAILY. DX: E11.9    Lancets (ONETOUCH ULTRASOFT) lancets USE TO CHECK BLOOD SUGAR  DAILY    loratadine (CLARITIN) 10 MG tablet Take 10 mg by mouth daily as needed for allergies.    metFORMIN (GLUCOPHAGE) 500 MG tablet TAKE 1 TABLET BY MOUTH  TWICE DAILY WITH MEALS    permethrin (ELIMITE) 5 % cream Apply topically. 10/28/2019: Waiting for Rx to be filled    pioglitazone (ACTOS) 30 MG tablet Take 0.5 tablets (15 mg total) by mouth daily.    simvastatin (ZOCOR) 40 MG tablet TAKE 1 TABLET BY MOUTH AT  BEDTIME    triamcinolone ointment (KENALOG) 0.5 % Apply 1 application topically 2 (two) times daily.    No facility-administered encounter medications on file as of 05/11/2020.    Lab Results  Component Value Date/Time   HGBA1C 6.1 10/05/2019 09:20 AM   HGBA1C 5.9 (A) 04/14/2019 09:39 AM   HGBA1C 5.8 01/06/2019 10:53 AM   MICROALBUR 2.0 (H) 09/14/2009 03:40 PM   MICROALBUR 3.8 08/18/2008 12:00 AM   MICROALBUR 3.8 08/18/2008 12:00 AM      BP Readings from Last 3 Encounters:  01/07/20 138/64  10/28/19 (!) 159/82  10/09/19 112/60    Attempted to contact patient 05/11/20 and 05/13/20 for initial questions prior to CCM appointment.  Left message for patient to return call. Unsuccessful Outreach.   Have you seen any other providers since your last visit with PCP? Yes  02/10/20- Dr. Paulette Blanch Ponce- Infectious Disease-   Any changes in your medications or health? Yes  02/10/20 - Started Truvada 200-300 mg   Patient's preferred pharmacy is:  [id number] - [place], [place] - [address]OSS [address] [place] [address] Phone: [phone] Fax: [phone]  [facility] [address] [place] [address] Phone: [phone] Fax: [phone]  [facility] - [place], [place] - [address] [place], [address] [place], [address] [place] [address] Phone: [phone] Fax: [phone]    Left message reminding patient to have all medications, supplements and any blood glucose and blood pressure readings available for review with Jacob Ponce, Pharm. D, at his telephone visit on 05/16/20 at 11:00.    Star Rating Drugs:  Medication:  Last Fill: Day Supply Metformin 500 mg 02/18/20 90 Pioglitazone 30 mg 04/27/20 90 Simvastatin 40 mg 02/18/20 90  Follow-Up:  Pharmacist Review  Jacob Ponce, CPP notified  Jacob Ponce, CMA  Clinical Pharmacy Assistant [phone]  I have reviewed the care management and care coordination activities outlined in this encounter and I am certifying that I agree with the content of this note. No further action required.  Jacob Ponce, PharmD Clinical Pharmacist [facility] [phone]

## 2020-05-12 LAB — URINE CYTOLOGY ANCILLARY ONLY
Chlamydia: NEGATIVE
Comment: NEGATIVE
Comment: NORMAL
Neisseria Gonorrhea: NEGATIVE

## 2020-05-12 LAB — CYTOLOGY, (ORAL, ANAL, URETHRAL) ANCILLARY ONLY
Chlamydia: NEGATIVE
Chlamydia: NEGATIVE
Comment: NEGATIVE
Comment: NEGATIVE
Comment: NORMAL
Comment: NORMAL
Neisseria Gonorrhea: NEGATIVE
Neisseria Gonorrhea: NEGATIVE

## 2020-05-13 ENCOUNTER — Other Ambulatory Visit: Payer: Self-pay | Admitting: Infectious Disease

## 2020-05-13 ENCOUNTER — Telehealth: Payer: Self-pay

## 2020-05-13 MED ORDER — EMTRICITABINE-TENOFOVIR AF 200-25 MG PO TABS: 1.0000 | ORAL_TABLET | Freq: Every day | ORAL | 2 refills | Status: DC

## 2020-05-13 NOTE — Telephone Encounter (Signed)
Per Dr. Daiva Eves, patient's HIV test was negative and Descovy prescription was sent to pharmacy. Patient had no questions/concerns.  Vera Furniss Loyola Mast, RN

## 2020-05-13 NOTE — Progress Notes (Addendum)
Chronic Care Management Pharmacy Note  05/16/2020 Name:  Jacob Ponce MRN:  [id number] DOB:  08-21-1940  Subjective: Jacob Ponce is an 80 y.o. year old male who is a primary patient of Damita Dunnings, Elveria Rising, MD.  The CCM team was consulted for assistance with disease management and care coordination needs.    Engaged with patient by telephone for initial visit in response to provider referral for pharmacy case management and/or care coordination services.   04/06/20 - CCM Consent  Consent to Services:  The patient was given the following information about Chronic Care Management services today, agreed to services, and gave verbal consent: 1. CCM service includes personalized support from designated clinical staff supervised by the primary care provider, including individualized plan of care and coordination with other care providers 2. 24/7 contact phone numbers for assistance for urgent and routine care needs. 3. Service will only be billed when office clinical staff spend 20 minutes or more in a month to coordinate care. 4. Only one practitioner may furnish and bill the service in a calendar month. 5.The patient may stop CCM services at any time (effective at the end of the month) by phone call to the office staff. 6. The patient will be responsible for cost sharing (co-pay) of up to 20% of the service fee (after annual deductible is met). Patient agreed to services and consent obtained.  Patient Care Team: Tonia Ghent, MD as PCP - Windle Guard, MD as Consulting Physician (Gastroenterology) Debbora Dus, [facility] as Pharmacist (Pharmacist)  Recent office visits: 02/02/20 - Pt Message - My eye doctor has recommended I stop taking Flowmax due to potentially negative side affects related to cataracts. It does not seem to help much beyond the Cardura anyway. 01/07/20 - PCP, skin lesion  10/09/19 - PCP - DM, BG ~ 120-140 on metformin, Actos. HTN off ARB at this point. HBV off statin.  LUTS on doxazosin, insomnia from nocturia.DM foot exam normal. I will check with infectious disease clinic and GI about potentially restarting statin. Start flomax but let me know if you have lightheadedness.  We'll call about seeing urology.  Use permethrin cream and let me know if that doesn't help.   Recent consult visits: 05/11/20 - Hepatitis B - [facility] visits: None in previous 6 months  Objective:  Lab Results  Component Value Date   CREATININE 0.95 05/11/2020   BUN 26 (H) 05/11/2020   GFR 69.91 10/05/2019   GFRNONAA 76 05/11/2020   GFRAA 89 05/11/2020   NA 138 05/11/2020   K 4.4 05/11/2020   CALCIUM 10.0 05/11/2020   CO2 25 05/11/2020   GLUCOSE 157 (H) 05/11/2020    Lab Results  Component Value Date/Time   HGBA1C 6.1 10/05/2019 09:20 AM   HGBA1C 5.9 (A) 04/14/2019 09:39 AM   HGBA1C 5.8 01/06/2019 10:53 AM   GFR 69.91 10/05/2019 09:20 AM   GFR 75.02 04/14/2019 10:27 AM   MICROALBUR 2.0 (H) 09/14/2009 03:40 PM   MICROALBUR 3.8 08/18/2008 12:00 AM   MICROALBUR 3.8 08/18/2008 12:00 AM    Last diabetic Eye exam:  Lab Results  Component Value Date/Time   HMDIABEYEEXA No Retinopathy 07/04/2016 12:00 AM    Last diabetic Foot exam: 09/21 normal   Lab Results  Component Value Date   CHOL 187 10/05/2019   HDL 38.40 (L) 10/05/2019   LDLCALC 130 (H) 10/05/2019   LDLDIRECT 69 04/16/2008   TRIG 97.0 10/05/2019   CHOLHDL 5 10/05/2019  Hepatic Function Latest Ref Rng & Units 05/11/2020 02/10/2020 10/28/2019  Total Protein 6.1 - 8.1 g/dL 6.6 6.8 6.8  Albumin 3.5 - 5.2 g/dL - - -  AST 10 - 35 U/L 30 35 36(H)  ALT 9 - 46 U/L 38 43 43  Alk Phosphatase 39 - 117 U/L - - -  Total Bilirubin 0.2 - 1.2 mg/dL 0.6 0.7 0.6  Bilirubin, Direct 0.0 - 0.3 mg/dL - - -    No results found for: TSH, FREET4  CBC Latest Ref Rng & Units 02/10/2020 04/14/2019 01/06/2019  WBC 3.8 - 10.8 Thousand/uL 6.8 6.9 6.8  Hemoglobin 13.2 - 17.1 g/dL 14.0 13.8 15.4  Hematocrit 38.5 -  50.0 % 41.2 40.5 45.3  Platelets 140 - 400 Thousand/uL 411(H) 314.0 326.0   No results found for: VD25OH  Clinical ASCVD: No  The 10-year ASCVD risk score Mikey Bussing DC Jr., et al., 2013) is: 73.3%   Values used to calculate the score:     Age: 89 years     Sex: Male     Is Non-Hispanic African American: No     Diabetic: Yes     Tobacco smoker: No     Systolic Blood Pressure: 478 mmHg     Is BP treated: Yes     HDL Cholesterol: 38.4 mg/dL     Total Cholesterol: 187 mg/dL    Depression screen [facility] 2/9 01/07/2020 10/28/2019 10/07/2019  Decreased Interest 0 0 1  Down, Depressed, Hopeless 0 0 1  PHQ - 2 Score 0 0 2  Altered sleeping - - 2  Tired, decreased energy - - 0  Change in appetite - - 0  Feeling bad or failure about yourself  - - 0  Trouble concentrating - - 0  Moving slowly or fidgety/restless - - 0  Suicidal thoughts - - 0  PHQ-9 Score - - 4  Difficult doing work/chores - - Not difficult at all   Social History   Tobacco Use  Smoking Status Former Smoker  . Types: Cigarettes  . Quit date: 02/05/1985  . Years since quitting: 35.2  Smokeless Tobacco Never Used  Tobacco Comment   Smoked from 1966 to 1987   BP Readings from Last 3 Encounters:  05/11/20 (!) 165/83  01/07/20 138/64  10/28/19 (!) 159/82   Pulse Readings from Last 3 Encounters:  05/11/20 (!) 57  01/07/20 78  10/28/19 74   Wt Readings from Last 3 Encounters:  05/11/20 188 lb (85.3 kg)  02/10/20 184 lb (83.5 kg)  01/07/20 184 lb (83.5 kg)   BMI Readings from Last 3 Encounters:  05/11/20 26.98 kg/m  02/10/20 26.40 kg/m  01/07/20 26.40 kg/m    Assessment/Interventions: Review of patient past medical history, allergies, medications, health status, including review of consultants reports, laboratory and other test data, was performed as part of comprehensive evaluation and provision of chronic care management services.   SDOH:  (Social Determinants of Health) assessments and interventions performed:  Yes SDOH Interventions   Flowsheet Row Most Recent Value  SDOH Interventions   Financial Strain Interventions Intervention Not Indicated  [Medications affordable]     SDOH Screenings   Alcohol Screen: Low Risk   . Last Alcohol Screening Score (AUDIT): 0  Depression (PHQ2-9): Low Risk   . PHQ-2 Score: 0  Financial Resource Strain: Low Risk   . Difficulty of Paying Living Expenses: Not hard at all  Food Insecurity: No Food Insecurity  . Worried About Charity fundraiser in the Last  Year: Never true  . Ran Out of Food in the Last Year: Never true  Housing: Low Risk   . Last Housing Risk Score: 0  Physical Activity: Sufficiently Active  . Days of Exercise per Week: 7 days  . Minutes of Exercise per Session: 60 min  Social Connections: Not on file  Stress: No Stress Concern Present  . Feeling of Stress : Not at all  Tobacco Use: Medium Risk  . Smoking Tobacco Use: Former Smoker  . Smokeless Tobacco Use: Never Used  Transportation Needs: No Transportation Needs  . Lack of Transportation (Medical): No  . Lack of Transportation (Non-Medical): No    CCM Care Plan  Allergies  Allergen Reactions  . Tamsulosin     Would avoid based on ophthalmology recommendation.    Medications Reviewed Today    Reviewed by Debbora Dus, [facility] (Pharmacist) on 05/16/20 at 1141  Med List Status: <None>  Medication Order Taking? Sig Documenting Provider Last Dose Status Informant  aspirin 81 MG tablet [id number] Yes Take 81 mg by mouth daily. [provider] Taking Active   doxazosin (CARDURA) 8 MG tablet [id number] Yes Take 1 tablet (8 mg total) by mouth at bedtime. Tonia Ghent, MD Taking Active   emtricitabine-tenofovir AF (DESCOVY) 200-25 MG tablet [id number] Yes Take 1 tablet by mouth daily. Tommy Medal, Lavell Islam, MD Taking Active   glucose blood [facility] ULTRA) test strip [id number] Yes USE TO CHECK BLOOD SUGAR DAILY. DX: E11.9 Tonia Ghent, MD Taking Active   Lancets [facility]  ULTRASOFT) lancets [id number] Yes USE TO CHECK BLOOD SUGAR  DAILY Tonia Ghent, MD Taking Active   loratadine (CLARITIN) 10 MG tablet [id number] Yes Take 10 mg by mouth daily as needed for allergies. [provider] Taking Active   metFORMIN (GLUCOPHAGE) 500 MG tablet [id number] Yes TAKE 1 TABLET BY MOUTH  TWICE DAILY WITH MEALS Tonia Ghent, MD Taking Active   Multiple Vitamins-Minerals (PRESERVISION AREDS 2 PO) [id number] Yes Take by mouth in the morning and at bedtime. [provider] Taking Active Self  pioglitazone (ACTOS) 30 MG tablet [id number] Yes Take 0.5 tablets (15 mg total) by mouth daily. Tonia Ghent, MD Taking Active   simvastatin (ZOCOR) 40 MG tablet [id number] Yes TAKE 1 TABLET BY MOUTH AT  BEDTIME Tonia Ghent, MD Taking Active           Patient Active Problem List   Diagnosis Date Noted  . On pre-exposure prophylaxis for HIV 02/10/2020  . Rash 01/10/2020  . Pruritic rash 10/12/2019  . Sore throat 10/12/2019  . Pneumonia 09/21/2018  . Constipation 09/21/2018  . HBV (hepatitis B virus) infection 09/10/2018  . Dysuria 06/30/2018  . Bradycardia 09/22/2017  . Healthcare maintenance 09/13/2016  . 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 PSA elevation 03/23/2016  . Advance care planning 03/30/2014  . Medicare annual wellness visit, subsequent 08/20/2010  . HLD (hyperlipidemia) 09/14/2009  . Essential hypertension 09/14/2009  . ORGANIC IMPOTENCE 09/14/2009  . BPH (benign prostatic hyperplasia) 09/14/2009  . Diabetes mellitus without complication ([place]) 09/62/8366    Immunization History  Administered Date(s) Administered  . Fluad Quad(high Dose 65+) 10/28/2019  . Hepatitis A, Adult 07/30/2019, 02/10/2020  . Influenza, High Dose Seasonal PF 03/28/2018  . PFIZER(Purple Top)SARS-COV-2 Vaccination 03/12/2019, 04/07/2019, 11/21/2019  . Pneumococcal Conjugate-13 08/31/2014  . Pneumococcal Polysaccharide-23 02/06/2004, 09/18/2011  . Td 02/06/2004  .  Tdap 12/04/2016    Conditions to be addressed/monitored:  Hypertension, Hyperlipidemia, Diabetes and BPH  Care  Plan : [place]  Updates made by Debbora Dus, [facility] since 05/16/2020 12:00 AM    Problem: CHL AMB "PATIENT-SPECIFIC PROBLEM"     Long-Range Goal: [place]   Start Date: 05/16/2020  Priority: High  Note:    Current Barriers:  . home blood pressure elevated  Pharmacist Clinical Goal(s):  Marland Kitchen Patient will contact provider office for questions/concerns as evidenced notation of same in electronic health record through collaboration with PharmD and provider.   Interventions: . 1:1 collaboration with Tonia Ghent, MD regarding development and update of comprehensive plan of care as evidenced by provider attestation and co-signature . Inter-disciplinary care team collaboration (see longitudinal plan of care) . Comprehensive medication review performed; medication list updated in electronic medical record  Hypertension (BP goal <140/90) Query -Uncontrolled - clinic readings elevated, home reading elevated today -Current treatment: . None -Medications previously tried: losartan - stopped after 15 lb weight loss/pneumonia last August 2021 -Current home readings: 150/78 this morning, prior has not checked since last August -Current dietary habits: eats a lot of salt, likes tortilla chips as snack -He may use Advil if pain, less than once monthly. Discussed opting for Tylenol. -He is using loratadine prn, denies Sudafed or other OTC meds. -Current exercise habits: walks in park daily, weather permitting, 3-5 miles  -Denies hypotensive symptoms (dizziness, imbalance)/hypertensive symptoms (chest pain, SOB, headache, vision change) -Educated on Importance of home blood pressure monitoring; Proper BP monitoring technique; -Counseled to monitor BP at home the rest of the week, document, and provide log at future appointments -Recommended to continue  current medication; Monitor BP this week, call if BP is above 140/90.   Hyperlipidemia: (LDL goal < 100) -Unable to assess - LDL 131 (pt was off statin for 1 month during last labs - 08/21) -Current treatment: . Simvastatin 40 mg - 1 tablet daily . Aspirin 81 mg - 1 daily -Medications previously tried: none  -Confirms daily adherence to statin.  -Educated on Benefits of statin for ASCVD risk reduction;  -Recommended to continue current medication; Will re-eval after next labs.  Diabetes (A1c goal <7%) -Controlled - A1c 6.1% -Current medications: Marland Kitchen Metformin 500 mg - 1 tablet twice daily with meals  . Actos 30 mg - 1/2 tablet daily -Medications previously tried: none reported  -Current home glucose readings - checks every morning . fasting glucose: 133,132, 142, 126, 126, 125, 136, 131, 139, 145 (chips) . post prandial glucose: none  -Denies hypoglycemic/hyperglycemic symptoms -Keeps a pack of PB crackers with him when traveling to avoid hypoglycemia  -Educated on A1c and blood sugar goals; -Counseled to check feet daily and get yearly eye exams -Recommended to continue current medication  BPH (Goal: Control symptoms) -Not ideally controlled - per pt report -Current treatment  . Doxazosin 8 mg - 1 tablet daily at bedtime  -Medications previously tried: Flomax  -He has good nights and bad nights. Usually wakes 2-3 times on good nights. Bad nights every 30 minutes. Denies association with fluid intake. He has been referred to urology and has an appt scheduled in the next 3 months. -Recommended to continue current medication; Initiate care with urology as planned.  OTCs: Loratadine 10 mg - Takes PRN during pollen season  Patient Goals/Self-Care Activities . Patient will:  - take medications as prescribed check glucose daily, document, and provide at future appointments  -check blood pressure this week and call if above 140/90 consistently  Follow Up Plan: Telephone follow up  appointment with  care management team member scheduled for:  12 months     Medication Assistance: None required.  Patient affirms current coverage meets needs.  [id number] [place] [address]Phone: [phone]: [address] [place] [address] [place] [place]Phone: [phone]: [phone] Uses pill box? Yes - weekly box, morning and afternoon  Pt endorses 100% compliance He uses [facility] mail order for maintenance meds. [facility]   We discussed: Current pharmacy is preferred with insurance plan and patient is satisfied with pharmacy services Patient decided to: Continue current medication management strategy  Care Plan and Follow Up Patient Decision:  Patient agrees to Care Plan and Follow-up.  Follow up: CMA call in 1 month for home BP log  MDebbora Dus PharmD Clinical Pharmacist [phone] Encounter details: CCM Time Spent      Value Time User   Time spent with patient (minutes)  80 05/16/2020  1:04 PM ADebbora Dus [employer]  Time spent performing Chart review  30 05/16/2020  1:04 PM [facility]ebbora Dus [facility]  Total time (minutes)  110 05/16/2020  1:04 PM ADebbora Dus RPH     Moderate to High Complex Decision Making      Value Time User   Moderate to High complex decision making  Yes 05/16/2020  1:02 PM ADebbora Dus [facility]    CCM Services: This encounter meets complex CCM services and moderate to high decision making.  Prior to outreach and patient consent for Chronic Care Management, I referred this patient for services after reviewing the nominated patient list or from a personal encounter with the patient.  I have personally reviewed this encounter including the documentation in this  note and have collaborated with the care management provider regarding care management and care coordination activities to include development and update of the comprehensive care plan. I am certifying that I agree with the content of this note and encounter as supervising physician. GElsie Stain

## 2020-05-13 NOTE — Chronic Care Management (AMB) (Addendum)
Patient returned call for Initial Questions  Lab Results  Component Value Date/Time   HGBA1C 6.1 10/05/2019 09:20 AM   HGBA1C 5.9 (A) 04/14/2019 09:39 AM   HGBA1C 5.8 01/06/2019 10:53 AM   MICROALBUR 2.0 (H) 09/14/2009 03:40 PM   MICROALBUR 3.8 08/18/2008 12:00 AM   MICROALBUR 3.8 08/18/2008 12:00 AM     BP Readings from Last 3 Encounters:  05/11/20 (!) 165/83  01/07/20 138/64  10/28/19 (!) 159/82   Have you seen any other providers since your last visit with PCP? Yes  02/10/20- Dr. Paulette [place]- Infectious Disease-   Any changes in your medications or health? Yes  02/10/20 - Started Truvada 200-300 mg  Any side effects from any medications? Yes but states it has been worked out with his providers  Do you have an symptoms or problems not managed by your medications? No  Any concerns about your health right now? No  Has your provider asked that you check blood pressure, blood sugar, or follow special diet at home? Yes  Do you get any type of exercise on a regular basis? Yes states he is very active  Can you think of a goal you would like to reach for your health? Yes would like to stay healthy  Do you have any problems getting your medications? No Patient's preferred pharmacy is:  [id number] [place], [place] - [address] [place] [address] Phone: [phone] Fax: [phone]  [facility] [address] [place] [address] Phone: [phone] Fax: [phone]  [facility] [place] [place] - [address] [place], [address] [place], [address] [place] [address] Phone: [phone] Fax: [phone]   Is there anything that you would like to discuss during the appointment? No  OSEI ANGER was reminded to have all medications, supplements and any blood glucose and blood pressure readings available for review with Phil Dopp, Pharm. D, at his  telephone visit on 05/16/20 at 11:00 .   Phil Dopp, CPP notified  [name], [facility] Clinical Pharmacy Assistant [phone]  I have reviewed the care management and care coordination activities outlined in this encounter and I am certifying that I agree with the content of this note. No further action required.  Phil Dopp, PharmD Clinical Pharmacist [employer] [facility] at [facility] [phone]

## 2020-05-15 LAB — COMPLETE METABOLIC PANEL WITH GFR
AG Ratio: 1.9 (calc) (ref 1.0–2.5)
ALT: 38 U/L (ref 9–46)
AST: 30 U/L (ref 10–35)
Albumin: 4.3 g/dL (ref 3.6–5.1)
Alkaline phosphatase (APISO): 42 U/L (ref 35–144)
BUN/Creatinine Ratio: 27 (calc) — ABNORMAL HIGH (ref 6–22)
BUN: 26 mg/dL — ABNORMAL HIGH (ref 7–25)
CO2: 25 mmol/L (ref 20–32)
Calcium: 10 mg/dL (ref 8.6–10.3)
Chloride: 105 mmol/L (ref 98–110)
Creat: 0.95 mg/dL (ref 0.70–1.18)
GFR, Est African American: 89 mL/min/{1.73_m2} (ref >=60)
GFR, Est Non African American: 76 mL/min/{1.73_m2} (ref >=60)
Globulin: 2.3 g/dL (calc) (ref 1.9–3.7)
Glucose, Bld: 157 mg/dL — ABNORMAL HIGH (ref 65–99)
Potassium: 4.4 mmol/L (ref 3.5–5.3)
Sodium: 138 mmol/L (ref 135–146)
Total Bilirubin: 0.6 mg/dL (ref 0.2–1.2)
Total Protein: 6.6 g/dL (ref 6.1–8.1)

## 2020-05-15 LAB — HIV ANTIBODY (ROUTINE TESTING W REFLEX): HIV 1&2 Ab, 4th Generation: NONREACTIVE

## 2020-05-15 LAB — HEPATITIS B DNA, ULTRAQUANTITATIVE, PCR
Hepatitis B DNA (Calc): 2.47 Log IU/mL — ABNORMAL HIGH
Hepatitis B DNA: 297 IU/mL — ABNORMAL HIGH

## 2020-05-15 LAB — HIV-1 RNA QUANT-NO REFLEX-BLD
HIV 1 RNA Quant: NOT DETECTED Copies/mL
HIV-1 RNA Quant, Log: NOT DETECTED Log cps/mL

## 2020-05-15 LAB — RPR: RPR Ser Ql: NONREACTIVE

## 2020-05-16 ENCOUNTER — Ambulatory Visit (INDEPENDENT_AMBULATORY_CARE_PROVIDER_SITE_OTHER): Payer: Medicare Other

## 2020-05-16 ENCOUNTER — Other Ambulatory Visit: Payer: Self-pay

## 2020-05-16 DIAGNOSIS — I10 Essential (primary) hypertension: Secondary | ICD-10-CM | POA: Diagnosis not present

## 2020-05-16 DIAGNOSIS — E785 Hyperlipidemia, unspecified: Secondary | ICD-10-CM

## 2020-05-16 DIAGNOSIS — E119 Type 2 diabetes mellitus without complications: Secondary | ICD-10-CM | POA: Diagnosis not present

## 2020-05-16 NOTE — Patient Instructions (Signed)
May 16, 2020  Dear Jacob Ponce,  It was a pleasure meeting you during our initial appointment on May 16, 2020. Below is a summary of the goals we discussed and components of chronic care management. Please contact me anytime with questions or concerns.   Visit Information  Patient Care Plan: CCM Pharmacy Care Plan    Problem Identified: CHL AMB "PATIENT-SPECIFIC PROBLEM"     Long-Range Goal: CCM Pharmacy Care Plan   Start Date: 05/16/2020  Priority: High  Note:    Current Barriers:  . home blood pressure elevated  Pharmacist Clinical Goal(s):  Marland Kitchen Patient will contact provider office for questions/concerns as evidenced notation of same in electronic health record through collaboration with PharmD and provider.   Interventions: . 1:1 collaboration with Joaquim Nam, MD regarding development and update of comprehensive plan of care as evidenced by provider attestation and co-signature . Inter-disciplinary care team collaboration (see longitudinal plan of care) . Comprehensive medication review performed; medication list updated in electronic medical record  Hypertension (BP goal <140/90) Query -Uncontrolled - clinic readings elevated, home reading elevated today -Current treatment: . None -Medications previously tried: losartan - stopped after 15 lb weight loss/pneumonia last August 2021 -Current home readings: 150/78 this morning, prior has not checked since last August -Current dietary habits: eats a lot of salt, likes tortilla chips as snack -He may use Advil if pain, less than once monthly. Discussed opting for Tylenol. -He is using loratadine prn, denies Sudafed or other OTC meds. -Current exercise habits: walks in park daily, weather permitting, 3-5 miles  -Denies hypotensive symptoms (dizziness, imbalance)/hypertensive symptoms (chest pain, SOB, headache, vision change) -Educated on Importance of home blood pressure monitoring; Proper BP monitoring  technique; -Counseled to monitor BP at home the rest of the week, document, and provide log at future appointments -Recommended to continue current medication; Monitor BP this week, call if BP is above 140/90.   Hyperlipidemia: (LDL goal < 100) -Unable to assess - LDL 131 (pt was off statin for 1 month during last labs - 08/21) -Current treatment: . Simvastatin 40 mg - 1 tablet daily . Aspirin 81 mg - 1 daily -Medications previously tried: none  -Confirms daily adherence to statin.  -Educated on Benefits of statin for ASCVD risk reduction;  -Recommended to continue current medication; Will re-eval after next labs.  Diabetes (A1c goal <7%) -Controlled - A1c 6.1% -Current medications: Marland Kitchen Metformin 500 mg - 1 tablet twice daily with meals  . Actos 30 mg - 1/2 tablet daily -Medications previously tried: none reported  -Current home glucose readings - checks every morning . fasting glucose: 133,132, 142, 126, 126, 125, 136, 131, 139, 145 (chips) . post prandial glucose: none  -Denies hypoglycemic/hyperglycemic symptoms -Keeps a pack of PB crackers with him when traveling to avoid hypoglycemia  -Educated on A1c and blood sugar goals; -Counseled to check feet daily and get yearly eye exams -Recommended to continue current medication  BPH (Goal: Control symptoms) -Not ideally controlled - per pt report -Current treatment  . Doxazosin 8 mg - 1 tablet daily at bedtime  -Medications previously tried: Flomax  -He has good nights and bad nights. Usually wakes 2-3 times on good nights. Bad nights every 30 minutes. Denies association with fluid intake. He has been referred to urology and has an appt scheduled in the next 3 months. -Recommended to continue current medication; Initiate care with urology as planned.  OTCs: Loratadine 10 mg - Takes PRN during pollen season  Patient  Goals/Self-Care Activities . Patient will:  - take medications as prescribed check glucose daily, document, and  provide at future appointments  -check blood pressure this week and call if above 140/90 consistently  Follow Up Plan: Telephone follow up appointment with care management team member scheduled for:  12 months      Jacob Ponce was given information about Chronic Care Management services today including:  1. CCM service includes personalized support from designated clinical staff supervised by his physician, including individualized plan of care and coordination with other care providers 2. 24/7 contact phone numbers for assistance for urgent and routine care needs. 3. Standard insurance, coinsurance, copays and deductibles apply for chronic care management only during months in which we provide at least 20 minutes of these services. Most insurances cover these services at 100%, however patients may be responsible for any copay, coinsurance and/or deductible if applicable. This service may help you avoid the need for more expensive face-to-face services. 4. Only one practitioner may furnish and bill the service in a calendar month. 5. The patient may stop CCM services at any time (effective at the end of the month) by phone call to the office staff.  Patient agreed to services and verbal consent obtained.   The patient verbalized understanding of instructions, educational materials, and care plan provided today and agreed to receive a mailed copy of patient instructions, educational materials, and care plan.   Phil DoppMichelle Lalani Winkles, PharmD Clinical Pharmacist [facility] [place] [place] [phone]   https://www.mata.com/https://www.nhlbi.nih.gov/files/docs/public/heart/dash_brief.pdf">  DASH Eating Plan DASH stands for Dietary Approaches to Stop Hypertension. The DASH eating plan is a healthy eating plan that has been shown to:  Reduce high blood pressure (hypertension).  Reduce your risk for type 2 diabetes, heart disease, and stroke.  Help with weight loss. What are tips for following this  plan? Reading food labels  Check food labels for the amount of salt (sodium) per serving. Choose foods with less than 5 percent of the Daily Value of sodium. Generally, foods with less than 300 milligrams (mg) of sodium per serving fit into this eating plan.  To find whole grains, look for the word "whole" as the first word in the ingredient list. Shopping  Buy products labeled as "low-sodium" or "no salt added."  Buy fresh foods. Avoid canned foods and pre-made or frozen meals. Cooking  Avoid adding salt when cooking. Use salt-free seasonings or herbs instead of table salt or sea salt. Check with your health care provider or pharmacist before using salt substitutes.  Do not fry foods. Cook foods using healthy methods such as baking, boiling, grilling, roasting, and broiling instead.  Cook with heart-healthy oils, such as olive, canola, avocado, soybean, or sunflower oil. Meal planning  Eat a balanced diet that includes: ? 4 or more servings of fruits and 4 or more servings of vegetables each day. Try to fill one-half of your plate with fruits and vegetables. ? 6-8 servings of whole grains each day. ? Less than 6 oz (170 g) of lean meat, poultry, or fish each day. A 3-oz (85-g) serving of meat is about the same size as a deck of cards. One egg equals 1 oz (28 g). ? 2-3 servings of low-fat dairy each day. One serving is 1 cup (237 mL). ? 1 serving of nuts, seeds, or beans 5 times each week. ? 2-3 servings of heart-healthy fats. Healthy fats called omega-3 fatty acids are found in foods such as walnuts, flaxseeds, fortified milks, and eggs.  These fats are also found in cold-water fish, such as sardines, salmon, and mackerel.  Limit how much you eat of: ? Canned or prepackaged foods. ? Food that is high in trans fat, such as some fried foods. ? Food that is high in saturated fat, such as fatty meat. ? Desserts and other sweets, sugary drinks, and other foods with added sugar. ? Full-fat  dairy products.  Do not salt foods before eating.  Do not eat more than 4 egg yolks a week.  Try to eat at least 2 vegetarian meals a week.  Eat more home-cooked food and less restaurant, buffet, and fast food.   Lifestyle  When eating at a restaurant, ask that your food be prepared with less salt or no salt, if possible.  If you drink alcohol: ? Limit how much you use to:  0-1 drink a day for women who are not pregnant.  0-2 drinks a day for men. ? Be aware of how much alcohol is in your drink. In the [place] one drink equals one 12 oz bottle of beer (355 mL), one 5 oz glass of wine (148 mL), or one 1 oz glass of hard liquor (44 mL). General information  Avoid eating more than 2,300 mg of salt a day. If you have hypertension, you may need to reduce your sodium intake to 1,500 mg a day.  Work with your health care provider to maintain a healthy body weight or to lose weight. Ask what an ideal weight is for you.  Get at least 30 minutes of exercise that causes your heart to beat faster (aerobic exercise) most days of the week. Activities may include walking, swimming, or biking.  Work with your health care provider or dietitian to adjust your eating plan to your individual calorie needs. What foods should I eat? Fruits All fresh, dried, or frozen fruit. Canned fruit in natural juice (without added sugar). Vegetables Fresh or frozen vegetables (raw, steamed, roasted, or grilled). Low-sodium or reduced-sodium tomato and vegetable juice. Low-sodium or reduced-sodium tomato sauce and tomato paste. Low-sodium or reduced-sodium canned vegetables. Grains Whole-grain or whole-wheat bread. Whole-grain or whole-wheat pasta. Brown rice. Orpah Cobb. Bulgur. Whole-grain and low-sodium cereals. Pita bread. Low-fat, low-sodium crackers. Whole-wheat flour tortillas. Meats and other proteins Skinless chicken or [place]. Ground chicken or [place]. Pork with fat trimmed off. Fish and seafood. Egg  whites. Dried beans, peas, or lentils. Unsalted nuts, nut butters, and seeds. Unsalted canned beans. Lean cuts of beef with fat trimmed off. Low-sodium, lean precooked or cured meat, such as sausages or meat loaves. Dairy Low-fat (1%) or fat-free (skim) milk. Reduced-fat, low-fat, or fat-free cheeses. Nonfat, low-sodium ricotta or cottage cheese. Low-fat or nonfat yogurt. Low-fat, low-sodium cheese. Fats and oils Soft margarine without trans fats. Vegetable oil. Reduced-fat, low-fat, or light mayonnaise and salad dressings (reduced-sodium). Canola, safflower, olive, avocado, soybean, and sunflower oils. Avocado. Seasonings and condiments Herbs. Spices. Seasoning mixes without salt. Other foods Unsalted popcorn and pretzels. Fat-free sweets. The items listed above may not be a complete list of foods and beverages you can eat. Contact a dietitian for more information. What foods should I avoid? Fruits Canned fruit in a light or heavy syrup. Fried fruit. Fruit in cream or butter sauce. Vegetables Creamed or fried vegetables. Vegetables in a cheese sauce. Regular canned vegetables (not low-sodium or reduced-sodium). Regular canned tomato sauce and paste (not low-sodium or reduced-sodium). Regular tomato and vegetable juice (not low-sodium or reduced-sodium). Rosita Fire. Olives. Grains Baked goods made with  fat, such as croissants, muffins, or some breads. Dry pasta or rice meal packs. Meats and other proteins Fatty cuts of meat. Ribs. Fried meat. Tomasa Blase. Bologna, salami, and other precooked or cured meats, such as sausages or meat loaves. Fat from the back of a pig (fatback). Bratwurst. Salted nuts and seeds. Canned beans with added salt. Canned or smoked fish. Whole eggs or egg yolks. Chicken or [place] with skin. Dairy Whole or 2% milk, cream, and half-and-half. Whole or full-fat cream cheese. Whole-fat or sweetened yogurt. Full-fat cheese. Nondairy creamers. Whipped toppings. Processed cheese and  cheese spreads. Fats and oils Butter. Stick margarine. Lard. Shortening. Ghee. Bacon fat. Tropical oils, such as coconut, palm kernel, or palm oil. Seasonings and condiments Onion salt, garlic salt, seasoned salt, table salt, and sea salt. Worcestershire sauce. Tartar sauce. Barbecue sauce. Teriyaki sauce. Soy sauce, including reduced-sodium. Steak sauce. Canned and packaged gravies. Fish sauce. Oyster sauce. Cocktail sauce. Store-bought horseradish. Ketchup. Mustard. Meat flavorings and tenderizers. Bouillon cubes. Hot sauces. Pre-made or packaged marinades. Pre-made or packaged taco seasonings. Relishes. Regular salad dressings. Other foods Salted popcorn and pretzels. The items listed above may not be a complete list of foods and beverages you should avoid. Contact a dietitian for more information. Where to find more information  National Heart, Lung, and Blood Institute: PopSteam.is  American Heart Association: www.heart.org  Academy of Nutrition and Dietetics: www.eatright.org  National Kidney Foundation: www.kidney.org Summary  The DASH eating plan is a healthy eating plan that has been shown to reduce high blood pressure (hypertension). It may also reduce your risk for type 2 diabetes, heart disease, and stroke.  When on the DASH eating plan, aim to eat more fresh fruits and vegetables, whole grains, lean proteins, low-fat dairy, and heart-healthy fats.  With the DASH eating plan, you should limit salt (sodium) intake to 2,300 mg a day. If you have hypertension, you may need to reduce your sodium intake to 1,500 mg a day.  Work with your health care provider or dietitian to adjust your eating plan to your individual calorie needs. This information is not intended to replace advice given to you by your health care provider. Make sure you discuss any questions you have with your health care provider. Document Revised: 12/26/2018 Document Reviewed: 12/26/2018 Elsevier Patient  Education  2021 [employer].

## 2020-05-19 ENCOUNTER — Other Ambulatory Visit: Payer: Self-pay | Admitting: Family Medicine

## 2020-05-27 ENCOUNTER — Other Ambulatory Visit: Payer: Self-pay | Admitting: Infectious Disease

## 2020-05-30 DIAGNOSIS — R3915 Urgency of urination: Secondary | ICD-10-CM | POA: Diagnosis not present

## 2020-05-30 DIAGNOSIS — R350 Frequency of micturition: Secondary | ICD-10-CM | POA: Diagnosis not present

## 2020-05-31 DIAGNOSIS — K5904 Chronic idiopathic constipation: Secondary | ICD-10-CM | POA: Diagnosis not present

## 2020-05-31 DIAGNOSIS — Z800 Family history of malignant neoplasm of digestive organs: Secondary | ICD-10-CM | POA: Diagnosis not present

## 2020-05-31 DIAGNOSIS — Z86010 Personal history of colonic polyps: Secondary | ICD-10-CM | POA: Diagnosis not present

## 2020-06-07 ENCOUNTER — Other Ambulatory Visit: Payer: Self-pay | Admitting: Family Medicine

## 2020-06-09 ENCOUNTER — Telehealth: Payer: Self-pay

## 2020-06-09 NOTE — Chronic Care Management (AMB) (Addendum)
Chronic Care Management Pharmacy Assistant   Name: Jacob Ponce  MRN: [id number] DOB: 23-Feb-1940  Reason for Encounter: Disease State HTN   Conditions to be addressed/monitored: HTN   Recent office visits:  None since last CCM contact  Recent consult visits:  05/31/2020 Dr.Robert Buccini, Gastroenterology - No medication changes  Hospital visits:  None in previous 6 months  Medications: Outpatient Encounter Medications as of 06/09/2020  Medication Sig   aspirin 81 MG tablet Take 81 mg by mouth daily.   doxazosin (CARDURA) 8 MG tablet TAKE 1 TABLET BY MOUTH AT  BEDTIME   emtricitabine-tenofovir AF (DESCOVY) 200-25 MG tablet Take 1 tablet by mouth daily.   glucose blood (ONETOUCH ULTRA) test strip USE TO CHECK BLOOD SUGAR  DAILY   Lancets (ONETOUCH ULTRASOFT) lancets USE TO CHECK BLOOD SUGAR  DAILY   loratadine (CLARITIN) 10 MG tablet Take 10 mg by mouth daily as needed for allergies.   metFORMIN (GLUCOPHAGE) 500 MG tablet TAKE 1 TABLET BY MOUTH  TWICE DAILY WITH MEALS   Multiple Vitamins-Minerals (PRESERVISION AREDS 2 PO) Take by mouth in the morning and at bedtime.   pioglitazone (ACTOS) 30 MG tablet Take 0.5 tablets (15 mg total) by mouth daily.   simvastatin (ZOCOR) 40 MG tablet TAKE 1 TABLET BY MOUTH AT  BEDTIME   No facility-administered encounter medications on file as of 06/09/2020.   Recent Office Vitals: BP Readings from Last 3 Encounters:  05/11/20 (!) 165/83  01/07/20 138/64  10/28/19 (!) 159/82   Pulse Readings from Last 3 Encounters:  05/11/20 (!) 57  01/07/20 78  10/28/19 74    Wt Readings from Last 3 Encounters:  05/11/20 188 lb (85.3 kg)  02/10/20 184 lb (83.5 kg)  01/07/20 184 lb (83.5 kg)     Kidney Function Lab Results  Component Value Date/Time   CREATININE 0.95 05/11/2020 12:00 AM   CREATININE 1.04 02/10/2020 09:34 AM   GFR 69.91 10/05/2019 09:20 AM   GFRNONAA 76 05/11/2020 12:00 AM   GFRAA 89 05/11/2020 12:00 AM    BMP Latest Ref Rng  & Units 05/11/2020 02/10/2020 10/28/2019  Glucose 65 - 99 mg/dL 710(G) 269(S) 854(O)  BUN 7 - 25 mg/dL 27(O) 35(K) 09(F)  Creatinine 0.70 - 1.18 mg/dL 8.18 2.99 3.71  BUN/Creat Ratio 6 - 22 (calc) 27(H) 30(H) 29(H)  Sodium 135 - 146 mmol/L 138 136 135  Potassium 3.5 - 5.3 mmol/L 4.4 4.1 4.5  Chloride 98 - 110 mmol/L 105 103 103  CO2 20 - 32 mmol/L 25 24 22   Calcium 8.6 - 10.3 mg/dL 69.6 9.8   Blood pressure  Current antihypertensive regimen: No pharmacotherapy  Patient verbally confirms he is taking the above medications as directed. Yes the patient confirmed no medications for HTN at this time.  How often are you checking your Blood Pressure? daily  he checks his blood pressure in the middle of the day after taking his medication.   Current home BP readings:    DATE:             BP               PULSE  06/09/2020 136/77  -  06/06/2020 126/65  -  Wrist or arm cuff: arm cuff  Caffeine intake: drinks 1 quart of black coffee a day through the am  Salt intake: habit of eating tortilla chips  OTC medications including pseudoephedrine or NSAIDs? no  Any readings above 180/120? No If yes any  symptoms of hypertensive emergency? patient denies any symptoms of high blood pressure  What recent interventions/DTPs have been made by any provider to improve Blood Pressure control since last CPP Visit:   None identified  Any recent hospitalizations or ED visits since last visit with CPP? No  What diet changes have been made to improve Blood Pressure Control?   None identified  What exercise is being done to improve your Blood Pressure Control?   The patient takes walks every day at the park and walks 4 miles a day.  Adherence Review: Is the patient currently on ACE/ARB medication? No Does the patient have >5 day gap between last estimated fill dates? No   Star Rating Drugs:  Medication:  Last Fill: Day Supply Metformin 500mg . 05/11/2020 90ds Pioglitazone  30mg . 04/27/2020 90ds Simvastatin 40 mg. 05/11/2020 90ds   Follow-Up:  Pharmacist Review  04/29/2020, CPP notified  07/11/2020, [facility] Clinical Pharmacy Assistant [phone]  I have reviewed the care management and care coordination activities outlined in this encounter and I am certifying that I agree with the content of this note. No further action required.  [facility], PharmD Clinical Pharmacist [facility] at [facility] [phone]

## 2020-06-21 ENCOUNTER — Other Ambulatory Visit: Payer: Self-pay | Admitting: Family Medicine

## 2020-06-24 ENCOUNTER — Other Ambulatory Visit: Payer: Self-pay | Admitting: Infectious Disease

## 2020-08-08 ENCOUNTER — Other Ambulatory Visit: Payer: Self-pay | Admitting: Infectious Disease

## 2020-08-09 NOTE — Telephone Encounter (Signed)
Unable to send 1 year supply as requested. Needs follow up every 3 month for PREP

## 2020-08-10 ENCOUNTER — Other Ambulatory Visit: Payer: Self-pay

## 2020-08-10 ENCOUNTER — Other Ambulatory Visit (HOSPITAL_COMMUNITY)
Admission: RE | Admit: 2020-08-10 | Discharge: 2020-08-10 | Disposition: A | Discharge status: Home / Self Care | Payer: Medicare Other | Source: Ambulatory Visit | Attending: Infectious Disease | Admitting: Infectious Disease

## 2020-08-10 ENCOUNTER — Encounter: Payer: Self-pay | Admitting: Infectious Disease

## 2020-08-10 ENCOUNTER — Ambulatory Visit: Payer: Medicare Other | Admitting: Infectious Disease

## 2020-08-10 VITALS — BP 128/78 | HR 74 | Temp 97.7°F | Resp 16 | Ht 70.0 in | Wt 189.8 lb

## 2020-08-10 DIAGNOSIS — L304 Erythema intertrigo: Secondary | ICD-10-CM

## 2020-08-10 DIAGNOSIS — E119 Type 2 diabetes mellitus without complications: Secondary | ICD-10-CM

## 2020-08-10 DIAGNOSIS — B181 Chronic viral hepatitis B without delta-agent: Secondary | ICD-10-CM

## 2020-08-10 DIAGNOSIS — Z79899 Other long term (current) drug therapy: Secondary | ICD-10-CM | POA: Insufficient documentation

## 2020-08-10 DIAGNOSIS — I10 Essential (primary) hypertension: Secondary | ICD-10-CM

## 2020-08-10 DIAGNOSIS — R350 Frequency of micturition: Secondary | ICD-10-CM | POA: Diagnosis not present

## 2020-08-10 DIAGNOSIS — L282 Other prurigo: Secondary | ICD-10-CM | POA: Diagnosis not present

## 2020-08-10 DIAGNOSIS — N400 Enlarged prostate without lower urinary tract symptoms: Secondary | ICD-10-CM

## 2020-08-10 DIAGNOSIS — N529 Male erectile dysfunction, unspecified: Secondary | ICD-10-CM

## 2020-08-10 HISTORY — DX: Erythema intertrigo: L30.4

## 2020-08-10 NOTE — Progress Notes (Signed)
Subjective:  Chief complaint follow-up : Follow-up for HIV prevention and chronic hepatitis B without hepatic coma without delta agent   Patient ID: Jacob Ponce, male    DOB: [birth year], 80 y.o.   MRN: [id number]  HPI  Jacob Ponce is a 80 year old man with DM, HTN, hyperlipidemia who contracted Hepatitis B in 2020/ He had had unprotected anal intercourse with another man in July 2020 and went for testing at the [facility] where he tested negative.    He then was hospitalized with pneumonia and found to have an acute elevation of his transaminases in August 2020 with his ALT being 511 and AST 165 and his HIV test was negative at that time.   Since then he had been seen by Dr. Matthias Hughs who has performed serologies on the patient as recently as April 2021.  At that time his hepatitis B surface antigen remained positive his hepatitis B E antigen was positive his hepatitis B E antigen antibody was negative his surface antibody qualitative was negative and his hepatitis B core antibody was positive on IgM testing in total.   Tested negative for hepatitis C.  We checked hepatitis B viral load which was at 530,000 copies. Transaminases were relatively stable. I did feel that it would be prudent to initiate therapy given his age in particular for hepatitis B but to do so in the form of DESCOVY which would also serve him in providing preexposure prophylaxis.  He had  been on DESCOVY since June 2021 and had seemed to tolerated quite well.   In the interim he then began  suffering from a pruritic rash that responded somewhat to topical triamcinolone.  He is also was prescribed permethrin.  He had not responded to antihistamines and topical corticosteroids I referred him to dermatology but he says his appointment with them  These areas became quite excoriated and he was treated with doxycycline by Dr. Para March his PCP  Para March also obtained a culture which yielded group A streptococcus.  Patient's rash did get a  little bit better while on doxy.  However he continued to have intense itching and still has presence of the rash.  Due to concerns that the Descovy might be causing the rash switched  him over to Truvada.  I was under the impression that the rash had improved after he had come off DESCOVY with the other intervention as these began to apply topical Goldbond to his arms and he has had improvement in the rash.  I had referred him to [facility] dermatology but he was quite frustrated with trying to even see them as he could not see them for months and they would not take his insurance.  Was complaining of some abdominal discomfort at last visit.  We reviewed the different benefits and risks to both the Truvada and DESCOVY and he would prefer to trial DESCOVY again.  I also considered other options in terms of preexposure prophylaxis would be Apretude IM, this would not cover his hepatitis B we could use another agent potentially such as entecavir to try to cover the hep B  Since I saw him his rash has continued to improve and he apparently is not using the Goldbond much anymore except in his groin where he has had some irritation and what sounds like intertrigo.  He has some problems with chronic urinary leakage and wears padding there and likely as a mentioned is developing intertrigo.  I offered to give him nystatin for this  but he would like to continue with his Goldbond as he feels that this helps control the issue.  He does have an enlarged prostate and rising PSA and is going to see urology later today for consideration of possible prostate biopsy.  He also suffers from erectile dysfunction but has been hesitant to use Viagra due to his concerns about lowering his blood pressure.  Since I last saw him he is continue to only be active with one other sexual partner which is a man.  He has not been sexually active with his wife for several decades.  His other sexual partner is HIV negative but  not on preexposure prophylaxis.     Past Medical History:  Diagnosis Date   BPH (benign prostatic hyperplasia)    Diabetes mellitus    Type II, controlled   Fracture 2011   Right proximal humerus, resolved w/o deficit   HBV (hepatitis B virus) infection    History of cardiac cath 1999   and Tilt Table Test, both negative   Hyperlipidemia    Hypertension    Impotence of organic origin    On pre-exposure prophylaxis for HIV 02/10/2020   Special screening for malignant neoplasms of other sites     Past Surgical History:  Procedure Laterality Date   APPENDECTOMY     TIBIA FRACTURE SURGERY     Left, resolved without deficit   TONSILLECTOMY      Family History  Problem Relation Age of Onset   Cancer Father        colon   Seizures Father        after head injury   Colon cancer Father    Colon cancer Mother    Arthritis Brother    Prostate cancer Neg Hx       Social History   Socioeconomic History   Marital status: Married    Spouse name: Not on file   Number of children: 4   Years of education: Not on file   Highest education level: Not on file  Occupational History   Occupation: Art gallery manager at [employer]    Employer: [employer]   Occupation: Pilot  Tobacco Use   Smoking status: Former    Pack years: 0.00    Types: Cigarettes    Quit date: 02/05/1985    Years since quitting: 35.5   Smokeless tobacco: Never   Tobacco comments:    Smoked from 1966 to 1987  Substance and Sexual Activity   Alcohol use: No    Alcohol/week: 0.0 standard drinks   Drug use: No   Sexual activity: Never  Other Topics Concern   Not on file  Social History Narrative   Undergrad and Masters at [employer].   Second marriage since 1987   4 children, 3 step-children   Flew small aircraft- retired from flying 2019   Working 3-4 days a week as of 2021   Social Determinants of Corporate investment banker Strain: Low Risk    Difficulty of Paying Living Expenses: Not hard at all  Food  Insecurity: No Food Insecurity   Worried About Programme researcher, broadcasting/film/video in the Last Year: Never true   Barista in the Last Year: Never true  Transportation Needs: No Transportation Needs   Lack of Transportation (Medical): No   Lack of Transportation (Non-Medical): No  Physical Activity: Sufficiently Active   Days of Exercise per Week: 7 days   Minutes of Exercise per Session: 60 min  Stress: No Stress Concern Present   Feeling of Stress : Not at all  Social Connections: Not on file    Allergies  Allergen Reactions   Tamsulosin     Would avoid based on ophthalmology recommendation.     Current Outpatient Medications:    aspirin 81 MG tablet, Take 81 mg by mouth daily., Disp: , Rfl:    doxazosin (CARDURA) 8 MG tablet, TAKE 1 TABLET BY MOUTH AT  BEDTIME, Disp: 90 tablet, Rfl: 3   emtricitabine-tenofovir AF (DESCOVY) 200-25 MG tablet, Take 1 tablet by mouth daily., Disp: 30 tablet, Rfl: 2   glucose blood (ONETOUCH ULTRA) test strip, USE TO CHECK BLOOD SUGAR  DAILY, Disp: 100 strip, Rfl: 3   Lancets (ONETOUCH ULTRASOFT) lancets, USE TO CHECK BLOOD SUGAR  DAILY, Disp: 100 each, Rfl: 3   loratadine (CLARITIN) 10 MG tablet, Take 10 mg by mouth daily as needed for allergies., Disp: , Rfl:    metFORMIN (GLUCOPHAGE) 500 MG tablet, TAKE 1 TABLET BY MOUTH  TWICE DAILY WITH MEALS, Disp: 180 tablet, Rfl: 3   Multiple Vitamins-Minerals (PRESERVISION AREDS 2 PO), Take by mouth in the morning and at bedtime., Disp: , Rfl:    pioglitazone (ACTOS) 30 MG tablet, TAKE ONE-HALF TABLET BY  MOUTH DAILY, Disp: 45 tablet, Rfl: 3   simvastatin (ZOCOR) 40 MG tablet, TAKE 1 TABLET BY MOUTH AT  BEDTIME, Disp: 90 tablet, Rfl: 3   Review of Systems  Constitutional:  Negative for chills and fever.  HENT:  Negative for congestion and sore throat.   Eyes:  Negative for photophobia.  Respiratory:  Negative for cough, shortness of breath and wheezing.   Cardiovascular:  Negative for chest pain, palpitations and  leg swelling.  Gastrointestinal:  Negative for abdominal pain, blood in stool, constipation, diarrhea, nausea and vomiting.  Genitourinary:  Negative for dysuria, flank pain and hematuria.  Musculoskeletal:  Negative for back pain and myalgias.  Skin:  Positive for rash.  Neurological:  Negative for dizziness, weakness and headaches.  Hematological:  Does not bruise/bleed easily.  Psychiatric/Behavioral:  Negative for suicidal ideas.       Objective:   Physical Exam Constitutional:      General: He is not in acute distress.    Appearance: Normal appearance. He is well-developed. He is not ill-appearing or diaphoretic.  HENT:     Head: Normocephalic and atraumatic.     Right Ear: Hearing and external ear normal.     Left Ear: Hearing and external ear normal.     Nose: No nasal deformity or rhinorrhea.  Eyes:     General: No scleral icterus.    Extraocular Movements: Extraocular movements intact.     Conjunctiva/sclera: Conjunctivae normal.     Right eye: Right conjunctiva is not injected.     Left eye: Left conjunctiva is not injected.     Pupils: Pupils are equal, round, and reactive to light.  Neck:     Vascular: No JVD.  Cardiovascular:     Rate and Rhythm: Normal rate and regular rhythm.     Heart sounds: S1 normal and S2 normal.  Abdominal:     General: Bowel sounds are normal. There is no distension.     Palpations: Abdomen is soft.     Tenderness: There is no abdominal tenderness.  Musculoskeletal:        General: Normal range of motion.     Right shoulder: Normal.     Left shoulder: Normal.  Cervical back: Normal range of motion and neck supple.     Right hip: Normal.     Left hip: Normal.     Right knee: Normal.     Left knee: Normal.  Lymphadenopathy:     Head:     Right side of head: No submandibular, preauricular or posterior auricular adenopathy.     Left side of head: No submandibular, preauricular or posterior auricular adenopathy.     Cervical: No  cervical adenopathy.     Right cervical: No superficial or deep cervical adenopathy.    Left cervical: No superficial or deep cervical adenopathy.  Skin:    General: Skin is warm and dry.     Coloration: Skin is not pale.     Findings: No abrasion, bruising, ecchymosis, erythema, lesion or rash.     Nails: There is no clubbing.  Neurological:     General: No focal deficit present.     Mental Status: He is alert and oriented to person, place, and time.     Sensory: No sensory deficit.     Coordination: Coordination normal.     Gait: Gait normal.  Psychiatric:        Attention and Perception: He is attentive.        Mood and Affect: Mood normal.        Speech: Speech normal.        Behavior: Behavior normal. Behavior is cooperative.        Thought Content: Thought content normal.        Judgment: Judgment normal.    Pruritic rash pictured October 28, 2019:       Areas of excoriationJanuary 5, 2022:      Arm today 05/11/2020:     Skin today 08/10/2020:      Assessment & Plan:   Chronic hepatitis B without delta agent:  Will check hepatitis B E. Antigen and E. Antibody Again Check CMP screen for hepatitis C again.  I am ordering an AFP and also an ultrasound  Continue Descovy for now  Need for preexposure prophylaxis: Screening for STIs to be done today HIV antibody and HIV RNA testing to be done.  As long as the DESCOVY is not the cause of his rash's is would continue with this therapy did discuss with him the idea of Apretude but he does not appear to be a patient who would need long-acting therapy as he is only ever missed 1 dose of DESCOVY when he attended a funeral.   Pruritic rash: Really not sure what is causing this but it appears better on exam today.  I did have another patient with chronic hepatitis C who had pruritus and was found to have a hepatic lesion that turned out to be a hemangioma.  I am going to screen him for hepatocellular carcinoma in any  case with ultrasound hopefully has no lesions in the liver.  His last ultrasound was clean  Intertrigo offered to give him nystatin but he would like to continue with Goldbond.  Rectal dysfunction: I am supportive of trying Viagra cautiously.    Elevated PSA going to see urology for consideration of biopsy  I spent more than 40 minutes with the patient including  face to face counseling of the patient personally reviewing radiographs, along with pertinent laboratory microbiological data review of medical records and in coordination of his care with ID pharmacy.

## 2020-08-11 ENCOUNTER — Other Ambulatory Visit: Payer: Self-pay | Admitting: Infectious Disease

## 2020-08-11 LAB — CYTOLOGY, (ORAL, ANAL, URETHRAL) ANCILLARY ONLY
Chlamydia: NEGATIVE
Chlamydia: NEGATIVE
Comment: NEGATIVE
Comment: NEGATIVE
Comment: NORMAL
Comment: NORMAL
Neisseria Gonorrhea: NEGATIVE
Neisseria Gonorrhea: NEGATIVE

## 2020-08-11 LAB — URINE CYTOLOGY ANCILLARY ONLY
Chlamydia: NEGATIVE
Comment: NEGATIVE
Comment: NORMAL
Neisseria Gonorrhea: NEGATIVE

## 2020-08-11 MED ORDER — EMTRICITABINE-TENOFOVIR AF 200-25 MG PO TABS: 1.0000 | ORAL_TABLET | Freq: Every day | ORAL | 2 refills | Status: DC

## 2020-08-12 LAB — CBC WITH DIFFERENTIAL/PLATELET
Absolute Monocytes: 672 cells/uL (ref 200–950)
Basophils Absolute: 29 cells/uL (ref 0–200)
Basophils Relative: 0.4 %
Eosinophils Absolute: 110 cells/uL (ref 15–500)
Eosinophils Relative: 1.5 %
HCT: 41.1 % (ref 38.5–50.0)
Hemoglobin: 13.9 g/dL (ref 13.2–17.1)
Lymphs Abs: 1161 cells/uL (ref 850–3900)
MCH: 31 pg (ref 27.0–33.0)
MCHC: 33.8 g/dL (ref 32.0–36.0)
MCV: 91.7 fL (ref 80.0–100.0)
MPV: 10.2 fL (ref 7.5–12.5)
Monocytes Relative: 9.2 %
Neutro Abs: 5329 cells/uL (ref 1500–7800)
Neutrophils Relative %: 73 %
Platelets: 331 10*3/uL (ref 140–400)
RBC: 4.48 10*6/uL (ref 4.20–5.80)
RDW: 13.1 % (ref 11.0–15.0)
Total Lymphocyte: 15.9 %
WBC: 7.3 10*3/uL (ref 3.8–10.8)

## 2020-08-12 LAB — COMPLETE METABOLIC PANEL WITH GFR
AG Ratio: 1.9 (calc) (ref 1.0–2.5)
ALT: 31 U/L (ref 9–46)
AST: 24 U/L (ref 10–35)
Albumin: 4.1 g/dL (ref 3.6–5.1)
Alkaline phosphatase (APISO): 40 U/L (ref 35–144)
BUN: 25 mg/dL (ref 7–25)
CO2: 24 mmol/L (ref 20–32)
Calcium: 10 mg/dL (ref 8.6–10.3)
Chloride: 103 mmol/L (ref 98–110)
Creat: 0.95 mg/dL (ref 0.70–1.18)
GFR, Est African American: 89 mL/min/{1.73_m2} (ref >=60)
GFR, Est Non African American: 76 mL/min/{1.73_m2} (ref >=60)
Globulin: 2.2 g/dL (calc) (ref 1.9–3.7)
Glucose, Bld: 172 mg/dL — ABNORMAL HIGH (ref 65–99)
Potassium: 4.2 mmol/L (ref 3.5–5.3)
Sodium: 136 mmol/L (ref 135–146)
Total Bilirubin: 0.6 mg/dL (ref 0.2–1.2)
Total Protein: 6.3 g/dL (ref 6.1–8.1)

## 2020-08-12 LAB — RPR: RPR Ser Ql: NONREACTIVE

## 2020-08-12 LAB — HEPATITIS B E ANTIBODY: Hep B E Ab: NONREACTIVE

## 2020-08-12 LAB — AFP TUMOR MARKER: AFP-Tumor Marker: 1.6 ng/mL (ref <6.1)

## 2020-08-12 LAB — HIV-1 RNA QUANT-NO REFLEX-BLD
HIV 1 RNA Quant: NOT DETECTED Copies/mL
HIV-1 RNA Quant, Log: NOT DETECTED Log cps/mL

## 2020-08-12 LAB — HIV ANTIBODY (ROUTINE TESTING W REFLEX): HIV 1&2 Ab, 4th Generation: NONREACTIVE

## 2020-08-12 LAB — HEPATITIS C ANTIBODY
Hepatitis C Ab: NONREACTIVE
SIGNAL TO CUT-OFF: 0.01 (ref <1.00)

## 2020-08-12 LAB — HEPATITIS B E ANTIGEN: Hep B E Ag: REACTIVE — AB

## 2020-08-16 DIAGNOSIS — Z86010 Personal history of colonic polyps: Secondary | ICD-10-CM | POA: Diagnosis not present

## 2020-08-16 DIAGNOSIS — Z800 Family history of malignant neoplasm of digestive organs: Secondary | ICD-10-CM | POA: Diagnosis not present

## 2020-08-16 DIAGNOSIS — K635 Polyp of colon: Secondary | ICD-10-CM | POA: Diagnosis not present

## 2020-08-16 LAB — HM COLONOSCOPY

## 2020-08-19 ENCOUNTER — Other Ambulatory Visit: Payer: Self-pay

## 2020-08-19 ENCOUNTER — Ambulatory Visit (HOSPITAL_COMMUNITY)
Admission: RE | Admit: 2020-08-19 | Discharge: 2020-08-19 | Disposition: A | Discharge status: Home / Self Care | Payer: Medicare Other | Source: Ambulatory Visit | Attending: Infectious Disease | Admitting: Infectious Disease

## 2020-08-19 DIAGNOSIS — B181 Chronic viral hepatitis B without delta-agent: Secondary | ICD-10-CM | POA: Insufficient documentation

## 2020-08-19 DIAGNOSIS — K635 Polyp of colon: Secondary | ICD-10-CM | POA: Diagnosis not present

## 2020-09-05 ENCOUNTER — Other Ambulatory Visit: Payer: Self-pay | Admitting: Infectious Disease

## 2020-09-16 ENCOUNTER — Ambulatory Visit (INDEPENDENT_AMBULATORY_CARE_PROVIDER_SITE_OTHER): Payer: Medicare Other | Admitting: Family Medicine

## 2020-09-16 ENCOUNTER — Encounter: Payer: Self-pay | Admitting: Family Medicine

## 2020-09-16 ENCOUNTER — Other Ambulatory Visit: Payer: Self-pay

## 2020-09-16 VITALS — BP 118/80 | HR 61 | Temp 96.3°F | Ht 70.0 in | Wt 189.4 lb

## 2020-09-16 DIAGNOSIS — N400 Enlarged prostate without lower urinary tract symptoms: Secondary | ICD-10-CM

## 2020-09-16 DIAGNOSIS — E785 Hyperlipidemia, unspecified: Secondary | ICD-10-CM | POA: Diagnosis not present

## 2020-09-16 DIAGNOSIS — E119 Type 2 diabetes mellitus without complications: Secondary | ICD-10-CM

## 2020-09-16 DIAGNOSIS — I10 Essential (primary) hypertension: Secondary | ICD-10-CM

## 2020-09-16 DIAGNOSIS — B181 Chronic viral hepatitis B without delta-agent: Secondary | ICD-10-CM

## 2020-09-16 LAB — LIPID PANEL
Cholesterol: 134 mg/dL (ref 0–200)
HDL: 47.7 mg/dL (ref >39.00)
LDL Cholesterol: 65 mg/dL (ref 0–99)
NonHDL: 86.45
Total CHOL/HDL Ratio: 3
Triglycerides: 105 mg/dL (ref 0.0–149.0)
VLDL: 21 mg/dL (ref 0.0–40.0)

## 2020-09-16 LAB — HEMOGLOBIN A1C: Hgb A1c MFr Bld: 6.8 % — ABNORMAL HIGH (ref 4.6–6.5)

## 2020-09-16 MED ORDER — METFORMIN HCL 500 MG PO TABS: ORAL_TABLET | ORAL | 3 refills | Status: DC

## 2020-09-16 MED ORDER — DOXAZOSIN MESYLATE 8 MG PO TABS: 4.0000 mg | ORAL_TABLET | Freq: Every day | ORAL | Status: DC

## 2020-09-16 NOTE — Patient Instructions (Addendum)
Go to the lab on the way out.   If you have mychart we'll likely use that to update you.    Take care.  Glad to see you. Cut cardura back to 4mg  a day.  Let me know if that isn't helping.   Plan on recheck in 6 months, sooner if needed.

## 2020-09-16 NOTE — Progress Notes (Signed)
This visit occurred during the SARS-CoV-2 public health emergency.  Safety protocols were in place, including screening questions prior to the visit, additional usage of staff PPE, and extensive cleaning of exam room while observing appropriate contact time as indicated for disinfecting solutions.  He is off uroxatral and back on doxazosin since that worked better.  Cautions d/w pt.  I asked him to cut doxazosin back to 4mg  a day, see below.    Hypertension:    Using medication without problems or lightheadedness: can get lightheaded.   Chest pain with exertion:no Edema:no Short of breath: only with sig exertion.  Stable.  This was expected given age and exertion.    Elevated Cholesterol: Using medications without problems: yes Muscle aches: no Diet compliance: encouraged.   Exercise: encouraged.   Balance changes.  When he starts walking.  Unclear if BP related.  No syncope.  No CP.  No focal neuro changes.  Doesn't happen supine.  Going on for weeks.  No vertigo.    Diabetes:  Using medications without difficulties: yes Hypoglycemic episodes:no Hyperglycemic episodes:no Feet problems: no Blood Sugars averaging: 130s.  eye exam within last year: due, f/u pending.    Has seen ID.  D/w pt.  No rash.    [place]    Colonoscopy done at [facility] about 1 month ago.    PMH and SH reviewed  Meds, vitals, and allergies reviewed.   ROS: Per HPI unless specifically indicated in ROS section   GEN: nad, alert and oriented HEENT: ncat NECK: supple w/o LA CV: rrr. PULM: ctab, no inc wob ABD: soft, +bs EXT: no edema SKIN: no acute rash  Diabetic foot exam: Normal inspection No skin breakdown No calluses  Normal DP pulses Normal sensation to light touch and monofilament Nails normal

## 2020-09-18 NOTE — Assessment & Plan Note (Signed)
Continue simvastatin.  See notes on labs. 

## 2020-09-18 NOTE — Assessment & Plan Note (Signed)
Discussed his balance changes.  No syncope.  No CP.  No focal neuro changes.  Doesn't happen supine.  Going on for weeks.  No vertigo.   I asked him to cut doxazosin back to 4mg  a day to see if that will help.

## 2020-09-18 NOTE — Assessment & Plan Note (Signed)
I asked him to cut doxazosin back to 4mg  a day, see below.

## 2020-09-18 NOTE — Assessment & Plan Note (Signed)
Continue metformin and Actos.  See notes on labs.

## 2020-09-18 NOTE — Assessment & Plan Note (Signed)
Per ID clinic.  Routine cautions given to patient.

## 2020-09-22 ENCOUNTER — Other Ambulatory Visit: Payer: Self-pay | Admitting: Infectious Disease

## 2020-09-22 ENCOUNTER — Encounter: Payer: Self-pay | Admitting: Family Medicine

## 2020-09-22 ENCOUNTER — Telehealth: Payer: Self-pay

## 2020-09-22 NOTE — Telephone Encounter (Signed)
I spoke with pt; pt said + covid test at home on 09/22/20; covid symptoms started on 09/20/20. Pt has dry and prod cough with yellow green phlegm; scratchy S/T and no SOB,CP, diarrhea or vomiting or H/A. Pt has not taken temp but does not feel warm. Pt wanting to know if needs antiviral med for covid. No available appts at [facility] today or tomorrow and pt lives in [place] and pt is going to UC in [place] on [address]. Sending note to DR Para March and Shanda Bumps CMA and also teams to [place]. Self quarantine, drink plenty of fluids, rest, and take Tylenol for fever. UC & ED precautions given and pt voiced understanding.

## 2020-09-22 NOTE — Progress Notes (Addendum)
    Chronic Care Management Pharmacy Assistant   Name: Jacob Ponce  MRN: [id number] DOB: 02-08-1940  Reason for Encounter: Patient call - Positive COVID test  Patient left message on 8/18 1:52 PM stating that he has tested positive for Covid and would like medication for this. Returned patients call at 2 PM, states he began having symptoms about 1-2 days ago and his symptoms are mostly just a cough and runny nose. Advised patient I would relay message to his PCP. Patient has sent message via MyChart as well.  Jacob Ponce, CPP notified  Jacob Ponce, [facility] Clinical Pharmacy Assistant [phone]  -------------------------------------  Sent to triage nurse.   Jacob Ponce, PharmD Clinical Pharmacist [facility] at [facility] [phone]

## 2020-09-23 DIAGNOSIS — U071 COVID-19: Secondary | ICD-10-CM | POA: Diagnosis not present

## 2020-09-23 DIAGNOSIS — S22029A Unspecified fracture of second thoracic vertebra, initial encounter for closed fracture: Secondary | ICD-10-CM | POA: Diagnosis not present

## 2020-09-23 DIAGNOSIS — J984 Other disorders of lung: Secondary | ICD-10-CM | POA: Diagnosis not present

## 2020-09-23 DIAGNOSIS — S22039A Unspecified fracture of third thoracic vertebra, initial encounter for closed fracture: Secondary | ICD-10-CM | POA: Diagnosis not present

## 2020-09-23 DIAGNOSIS — Y998 Other external cause status: Secondary | ICD-10-CM | POA: Diagnosis not present

## 2020-09-23 DIAGNOSIS — I444 Left anterior fascicular block: Secondary | ICD-10-CM | POA: Diagnosis not present

## 2020-09-23 DIAGNOSIS — W1839XA Other fall on same level, initial encounter: Secondary | ICD-10-CM | POA: Diagnosis not present

## 2020-09-23 DIAGNOSIS — Z20822 Contact with and (suspected) exposure to covid-19: Secondary | ICD-10-CM | POA: Diagnosis not present

## 2020-09-23 DIAGNOSIS — R55 Syncope and collapse: Secondary | ICD-10-CM | POA: Diagnosis not present

## 2020-09-23 DIAGNOSIS — M25521 Pain in right elbow: Secondary | ICD-10-CM | POA: Diagnosis not present

## 2020-09-23 DIAGNOSIS — I700 Atherosclerosis of aorta: Secondary | ICD-10-CM | POA: Diagnosis not present

## 2020-09-23 DIAGNOSIS — Z87891 Personal history of nicotine dependence: Secondary | ICD-10-CM | POA: Diagnosis not present

## 2020-09-23 DIAGNOSIS — S22001A Stable burst fracture of unspecified thoracic vertebra, initial encounter for closed fracture: Secondary | ICD-10-CM | POA: Diagnosis not present

## 2020-09-23 DIAGNOSIS — S22031A Stable burst fracture of third thoracic vertebra, initial encounter for closed fracture: Secondary | ICD-10-CM | POA: Diagnosis not present

## 2020-09-23 DIAGNOSIS — R42 Dizziness and giddiness: Secondary | ICD-10-CM | POA: Diagnosis not present

## 2020-09-23 DIAGNOSIS — S0990XA Unspecified injury of head, initial encounter: Secondary | ICD-10-CM | POA: Diagnosis not present

## 2020-09-23 DIAGNOSIS — R918 Other nonspecific abnormal finding of lung field: Secondary | ICD-10-CM | POA: Diagnosis not present

## 2020-09-23 DIAGNOSIS — R059 Cough, unspecified: Secondary | ICD-10-CM | POA: Diagnosis not present

## 2020-09-23 DIAGNOSIS — Z7982 Long term (current) use of aspirin: Secondary | ICD-10-CM | POA: Diagnosis not present

## 2020-09-23 DIAGNOSIS — S32019A Unspecified fracture of first lumbar vertebra, initial encounter for closed fracture: Secondary | ICD-10-CM | POA: Diagnosis not present

## 2020-09-23 DIAGNOSIS — E871 Hypo-osmolality and hyponatremia: Secondary | ICD-10-CM | POA: Diagnosis not present

## 2020-09-23 DIAGNOSIS — S62306A Unspecified fracture of fifth metacarpal bone, right hand, initial encounter for closed fracture: Secondary | ICD-10-CM | POA: Diagnosis not present

## 2020-09-23 DIAGNOSIS — S22020A Wedge compression fracture of second thoracic vertebra, initial encounter for closed fracture: Secondary | ICD-10-CM | POA: Diagnosis not present

## 2020-09-23 DIAGNOSIS — M4814 Ankylosing hyperostosis [Forestier], thoracic region: Secondary | ICD-10-CM | POA: Diagnosis not present

## 2020-09-23 DIAGNOSIS — S3991XA Unspecified injury of abdomen, initial encounter: Secondary | ICD-10-CM | POA: Diagnosis not present

## 2020-09-23 DIAGNOSIS — S2241XA Multiple fractures of ribs, right side, initial encounter for closed fracture: Secondary | ICD-10-CM | POA: Diagnosis not present

## 2020-09-23 DIAGNOSIS — S22030A Wedge compression fracture of third thoracic vertebra, initial encounter for closed fracture: Secondary | ICD-10-CM | POA: Diagnosis not present

## 2020-09-23 DIAGNOSIS — W19XXXA Unspecified fall, initial encounter: Secondary | ICD-10-CM | POA: Diagnosis not present

## 2020-09-23 DIAGNOSIS — Z741 Need for assistance with personal care: Secondary | ICD-10-CM | POA: Diagnosis not present

## 2020-09-23 DIAGNOSIS — S62308A Unspecified fracture of other metacarpal bone, initial encounter for closed fracture: Secondary | ICD-10-CM | POA: Diagnosis not present

## 2020-09-23 DIAGNOSIS — Z7984 Long term (current) use of oral hypoglycemic drugs: Secondary | ICD-10-CM | POA: Diagnosis not present

## 2020-09-23 DIAGNOSIS — S62303A Unspecified fracture of third metacarpal bone, left hand, initial encounter for closed fracture: Secondary | ICD-10-CM | POA: Diagnosis not present

## 2020-09-23 DIAGNOSIS — S62397A Other fracture of fifth metacarpal bone, left hand, initial encounter for closed fracture: Secondary | ICD-10-CM | POA: Diagnosis not present

## 2020-09-23 DIAGNOSIS — S62305A Unspecified fracture of fourth metacarpal bone, left hand, initial encounter for closed fracture: Secondary | ICD-10-CM | POA: Diagnosis not present

## 2020-09-23 DIAGNOSIS — Z79899 Other long term (current) drug therapy: Secondary | ICD-10-CM | POA: Diagnosis not present

## 2020-09-23 DIAGNOSIS — E1165 Type 2 diabetes mellitus with hyperglycemia: Secondary | ICD-10-CM | POA: Diagnosis not present

## 2020-09-23 DIAGNOSIS — S81811A Laceration without foreign body, right lower leg, initial encounter: Secondary | ICD-10-CM | POA: Diagnosis not present

## 2020-09-23 DIAGNOSIS — I10 Essential (primary) hypertension: Secondary | ICD-10-CM | POA: Diagnosis not present

## 2020-09-23 DIAGNOSIS — I517 Cardiomegaly: Secondary | ICD-10-CM | POA: Diagnosis not present

## 2020-09-23 DIAGNOSIS — E11319 Type 2 diabetes mellitus with unspecified diabetic retinopathy without macular edema: Secondary | ICD-10-CM | POA: Diagnosis not present

## 2020-09-23 DIAGNOSIS — Y9301 Activity, walking, marching and hiking: Secondary | ICD-10-CM | POA: Diagnosis not present

## 2020-09-23 DIAGNOSIS — Z7409 Other reduced mobility: Secondary | ICD-10-CM | POA: Diagnosis not present

## 2020-09-23 DIAGNOSIS — S32029A Unspecified fracture of second lumbar vertebra, initial encounter for closed fracture: Secondary | ICD-10-CM | POA: Diagnosis not present

## 2020-09-23 DIAGNOSIS — N200 Calculus of kidney: Secondary | ICD-10-CM | POA: Diagnosis not present

## 2020-09-23 DIAGNOSIS — Z043 Encounter for examination and observation following other accident: Secondary | ICD-10-CM | POA: Diagnosis not present

## 2020-09-23 DIAGNOSIS — E559 Vitamin D deficiency, unspecified: Secondary | ICD-10-CM | POA: Diagnosis not present

## 2020-09-23 DIAGNOSIS — R0902 Hypoxemia: Secondary | ICD-10-CM | POA: Diagnosis not present

## 2020-09-23 DIAGNOSIS — Z049 Encounter for examination and observation for unspecified reason: Secondary | ICD-10-CM | POA: Diagnosis not present

## 2020-09-23 DIAGNOSIS — R0602 Shortness of breath: Secondary | ICD-10-CM | POA: Diagnosis not present

## 2020-09-23 DIAGNOSIS — S62307A Unspecified fracture of fifth metacarpal bone, left hand, initial encounter for closed fracture: Secondary | ICD-10-CM | POA: Diagnosis not present

## 2020-09-23 DIAGNOSIS — Z743 Need for continuous supervision: Secondary | ICD-10-CM | POA: Diagnosis not present

## 2020-09-23 DIAGNOSIS — M154 Erosive (osteo)arthritis: Secondary | ICD-10-CM | POA: Diagnosis not present

## 2020-09-23 DIAGNOSIS — S22019A Unspecified fracture of first thoracic vertebra, initial encounter for closed fracture: Secondary | ICD-10-CM | POA: Diagnosis not present

## 2020-09-23 DIAGNOSIS — S01311A Laceration without foreign body of right ear, initial encounter: Secondary | ICD-10-CM | POA: Diagnosis not present

## 2020-09-23 NOTE — Telephone Encounter (Signed)
Agree with UC eval.  Thanks.  

## 2020-09-23 NOTE — Telephone Encounter (Signed)
See separate phone note. Patient was triaged by [facility] and is going to urgent care for evaluation

## 2020-09-24 DIAGNOSIS — S22029A Unspecified fracture of second thoracic vertebra, initial encounter for closed fracture: Secondary | ICD-10-CM | POA: Diagnosis not present

## 2020-09-24 DIAGNOSIS — S62307A Unspecified fracture of fifth metacarpal bone, left hand, initial encounter for closed fracture: Secondary | ICD-10-CM | POA: Diagnosis not present

## 2020-09-24 DIAGNOSIS — S62308A Unspecified fracture of other metacarpal bone, initial encounter for closed fracture: Secondary | ICD-10-CM | POA: Insufficient documentation

## 2020-09-24 DIAGNOSIS — S22019A Unspecified fracture of first thoracic vertebra, initial encounter for closed fracture: Secondary | ICD-10-CM | POA: Diagnosis not present

## 2020-09-24 DIAGNOSIS — S22031A Stable burst fracture of third thoracic vertebra, initial encounter for closed fracture: Secondary | ICD-10-CM | POA: Diagnosis not present

## 2020-09-24 DIAGNOSIS — S32019A Unspecified fracture of first lumbar vertebra, initial encounter for closed fracture: Secondary | ICD-10-CM | POA: Diagnosis not present

## 2020-09-24 DIAGNOSIS — S32029A Unspecified fracture of second lumbar vertebra, initial encounter for closed fracture: Secondary | ICD-10-CM | POA: Diagnosis not present

## 2020-09-24 DIAGNOSIS — S22021A Stable burst fracture of second thoracic vertebra, initial encounter for closed fracture: Secondary | ICD-10-CM | POA: Diagnosis not present

## 2020-09-24 DIAGNOSIS — S22020A Wedge compression fracture of second thoracic vertebra, initial encounter for closed fracture: Secondary | ICD-10-CM | POA: Diagnosis not present

## 2020-09-24 DIAGNOSIS — S62306A Unspecified fracture of fifth metacarpal bone, right hand, initial encounter for closed fracture: Secondary | ICD-10-CM | POA: Diagnosis not present

## 2020-09-24 DIAGNOSIS — S62305A Unspecified fracture of fourth metacarpal bone, left hand, initial encounter for closed fracture: Secondary | ICD-10-CM | POA: Diagnosis not present

## 2020-09-24 DIAGNOSIS — S2241XA Multiple fractures of ribs, right side, initial encounter for closed fracture: Secondary | ICD-10-CM | POA: Diagnosis not present

## 2020-09-25 DIAGNOSIS — I700 Atherosclerosis of aorta: Secondary | ICD-10-CM | POA: Diagnosis not present

## 2020-09-25 DIAGNOSIS — S22031A Stable burst fracture of third thoracic vertebra, initial encounter for closed fracture: Secondary | ICD-10-CM | POA: Diagnosis not present

## 2020-09-25 DIAGNOSIS — M4814 Ankylosing hyperostosis [Forestier], thoracic region: Secondary | ICD-10-CM | POA: Diagnosis not present

## 2020-09-25 DIAGNOSIS — E559 Vitamin D deficiency, unspecified: Secondary | ICD-10-CM | POA: Diagnosis not present

## 2020-09-25 DIAGNOSIS — S62397A Other fracture of fifth metacarpal bone, left hand, initial encounter for closed fracture: Secondary | ICD-10-CM | POA: Diagnosis not present

## 2020-09-25 DIAGNOSIS — Z79899 Other long term (current) drug therapy: Secondary | ICD-10-CM | POA: Diagnosis not present

## 2020-09-25 DIAGNOSIS — E11319 Type 2 diabetes mellitus with unspecified diabetic retinopathy without macular edema: Secondary | ICD-10-CM | POA: Diagnosis not present

## 2020-09-25 DIAGNOSIS — S32019A Unspecified fracture of first lumbar vertebra, initial encounter for closed fracture: Secondary | ICD-10-CM | POA: Diagnosis not present

## 2020-09-25 DIAGNOSIS — I10 Essential (primary) hypertension: Secondary | ICD-10-CM | POA: Diagnosis not present

## 2020-09-25 DIAGNOSIS — S22020A Wedge compression fracture of second thoracic vertebra, initial encounter for closed fracture: Secondary | ICD-10-CM | POA: Diagnosis not present

## 2020-09-25 DIAGNOSIS — S32029A Unspecified fracture of second lumbar vertebra, initial encounter for closed fracture: Secondary | ICD-10-CM | POA: Diagnosis not present

## 2020-09-25 DIAGNOSIS — W19XXXA Unspecified fall, initial encounter: Secondary | ICD-10-CM | POA: Diagnosis not present

## 2020-09-25 DIAGNOSIS — S2241XA Multiple fractures of ribs, right side, initial encounter for closed fracture: Secondary | ICD-10-CM | POA: Diagnosis not present

## 2020-09-25 DIAGNOSIS — M154 Erosive (osteo)arthritis: Secondary | ICD-10-CM | POA: Diagnosis not present

## 2020-09-25 DIAGNOSIS — R42 Dizziness and giddiness: Secondary | ICD-10-CM | POA: Diagnosis not present

## 2020-09-25 DIAGNOSIS — N200 Calculus of kidney: Secondary | ICD-10-CM | POA: Diagnosis not present

## 2020-09-25 DIAGNOSIS — Z7409 Other reduced mobility: Secondary | ICD-10-CM | POA: Diagnosis not present

## 2020-09-25 DIAGNOSIS — S22019A Unspecified fracture of first thoracic vertebra, initial encounter for closed fracture: Secondary | ICD-10-CM | POA: Diagnosis not present

## 2020-09-25 DIAGNOSIS — S62308A Unspecified fracture of other metacarpal bone, initial encounter for closed fracture: Secondary | ICD-10-CM | POA: Diagnosis not present

## 2020-09-25 DIAGNOSIS — Z7984 Long term (current) use of oral hypoglycemic drugs: Secondary | ICD-10-CM | POA: Diagnosis not present

## 2020-09-25 DIAGNOSIS — S62305A Unspecified fracture of fourth metacarpal bone, left hand, initial encounter for closed fracture: Secondary | ICD-10-CM | POA: Diagnosis not present

## 2020-09-25 DIAGNOSIS — Z741 Need for assistance with personal care: Secondary | ICD-10-CM | POA: Diagnosis not present

## 2020-09-25 DIAGNOSIS — E1165 Type 2 diabetes mellitus with hyperglycemia: Secondary | ICD-10-CM | POA: Diagnosis not present

## 2020-09-25 DIAGNOSIS — Z7982 Long term (current) use of aspirin: Secondary | ICD-10-CM | POA: Diagnosis not present

## 2020-09-25 DIAGNOSIS — Z87891 Personal history of nicotine dependence: Secondary | ICD-10-CM | POA: Diagnosis not present

## 2020-09-25 DIAGNOSIS — S62306A Unspecified fracture of fifth metacarpal bone, right hand, initial encounter for closed fracture: Secondary | ICD-10-CM | POA: Diagnosis not present

## 2020-09-25 DIAGNOSIS — R55 Syncope and collapse: Secondary | ICD-10-CM | POA: Diagnosis not present

## 2020-09-25 DIAGNOSIS — U071 COVID-19: Secondary | ICD-10-CM | POA: Diagnosis not present

## 2020-09-26 DIAGNOSIS — Z7982 Long term (current) use of aspirin: Secondary | ICD-10-CM | POA: Diagnosis not present

## 2020-09-26 DIAGNOSIS — S32019A Unspecified fracture of first lumbar vertebra, initial encounter for closed fracture: Secondary | ICD-10-CM | POA: Diagnosis not present

## 2020-09-26 DIAGNOSIS — S62308A Unspecified fracture of other metacarpal bone, initial encounter for closed fracture: Secondary | ICD-10-CM | POA: Diagnosis not present

## 2020-09-26 DIAGNOSIS — N200 Calculus of kidney: Secondary | ICD-10-CM | POA: Diagnosis not present

## 2020-09-26 DIAGNOSIS — R42 Dizziness and giddiness: Secondary | ICD-10-CM | POA: Diagnosis not present

## 2020-09-26 DIAGNOSIS — S32029A Unspecified fracture of second lumbar vertebra, initial encounter for closed fracture: Secondary | ICD-10-CM | POA: Diagnosis not present

## 2020-09-26 DIAGNOSIS — Z7984 Long term (current) use of oral hypoglycemic drugs: Secondary | ICD-10-CM | POA: Diagnosis not present

## 2020-09-26 DIAGNOSIS — S22031A Stable burst fracture of third thoracic vertebra, initial encounter for closed fracture: Secondary | ICD-10-CM | POA: Diagnosis not present

## 2020-09-26 DIAGNOSIS — E1165 Type 2 diabetes mellitus with hyperglycemia: Secondary | ICD-10-CM | POA: Diagnosis not present

## 2020-09-26 DIAGNOSIS — Z87891 Personal history of nicotine dependence: Secondary | ICD-10-CM | POA: Diagnosis not present

## 2020-09-26 DIAGNOSIS — Z79899 Other long term (current) drug therapy: Secondary | ICD-10-CM | POA: Diagnosis not present

## 2020-09-26 DIAGNOSIS — S22020A Wedge compression fracture of second thoracic vertebra, initial encounter for closed fracture: Secondary | ICD-10-CM | POA: Diagnosis not present

## 2020-09-26 DIAGNOSIS — I700 Atherosclerosis of aorta: Secondary | ICD-10-CM | POA: Diagnosis not present

## 2020-09-26 DIAGNOSIS — I10 Essential (primary) hypertension: Secondary | ICD-10-CM | POA: Diagnosis not present

## 2020-09-26 DIAGNOSIS — R55 Syncope and collapse: Secondary | ICD-10-CM | POA: Diagnosis not present

## 2020-09-26 DIAGNOSIS — M4814 Ankylosing hyperostosis [Forestier], thoracic region: Secondary | ICD-10-CM | POA: Diagnosis not present

## 2020-09-26 DIAGNOSIS — M154 Erosive (osteo)arthritis: Secondary | ICD-10-CM | POA: Diagnosis not present

## 2020-09-26 DIAGNOSIS — E559 Vitamin D deficiency, unspecified: Secondary | ICD-10-CM | POA: Diagnosis not present

## 2020-09-26 DIAGNOSIS — Z7409 Other reduced mobility: Secondary | ICD-10-CM | POA: Diagnosis not present

## 2020-09-26 DIAGNOSIS — U071 COVID-19: Secondary | ICD-10-CM | POA: Diagnosis not present

## 2020-09-26 DIAGNOSIS — E11319 Type 2 diabetes mellitus with unspecified diabetic retinopathy without macular edema: Secondary | ICD-10-CM | POA: Diagnosis not present

## 2020-09-26 DIAGNOSIS — Z741 Need for assistance with personal care: Secondary | ICD-10-CM | POA: Diagnosis not present

## 2020-09-26 DIAGNOSIS — S2241XA Multiple fractures of ribs, right side, initial encounter for closed fracture: Secondary | ICD-10-CM | POA: Diagnosis not present

## 2020-09-26 DIAGNOSIS — S62397A Other fracture of fifth metacarpal bone, left hand, initial encounter for closed fracture: Secondary | ICD-10-CM | POA: Diagnosis not present

## 2020-09-27 DIAGNOSIS — E1165 Type 2 diabetes mellitus with hyperglycemia: Secondary | ICD-10-CM | POA: Diagnosis not present

## 2020-09-27 DIAGNOSIS — I700 Atherosclerosis of aorta: Secondary | ICD-10-CM | POA: Diagnosis not present

## 2020-09-27 DIAGNOSIS — S62397A Other fracture of fifth metacarpal bone, left hand, initial encounter for closed fracture: Secondary | ICD-10-CM | POA: Diagnosis not present

## 2020-09-27 DIAGNOSIS — S62308A Unspecified fracture of other metacarpal bone, initial encounter for closed fracture: Secondary | ICD-10-CM | POA: Diagnosis not present

## 2020-09-27 DIAGNOSIS — Z7984 Long term (current) use of oral hypoglycemic drugs: Secondary | ICD-10-CM | POA: Diagnosis not present

## 2020-09-27 DIAGNOSIS — N200 Calculus of kidney: Secondary | ICD-10-CM | POA: Diagnosis not present

## 2020-09-27 DIAGNOSIS — E559 Vitamin D deficiency, unspecified: Secondary | ICD-10-CM | POA: Diagnosis not present

## 2020-09-27 DIAGNOSIS — S2241XA Multiple fractures of ribs, right side, initial encounter for closed fracture: Secondary | ICD-10-CM | POA: Diagnosis not present

## 2020-09-27 DIAGNOSIS — S22020A Wedge compression fracture of second thoracic vertebra, initial encounter for closed fracture: Secondary | ICD-10-CM | POA: Diagnosis not present

## 2020-09-27 DIAGNOSIS — R42 Dizziness and giddiness: Secondary | ICD-10-CM | POA: Diagnosis not present

## 2020-09-27 DIAGNOSIS — U071 COVID-19: Secondary | ICD-10-CM | POA: Diagnosis not present

## 2020-09-27 DIAGNOSIS — R6889 Other general symptoms and signs: Secondary | ICD-10-CM | POA: Diagnosis not present

## 2020-09-27 DIAGNOSIS — E11319 Type 2 diabetes mellitus with unspecified diabetic retinopathy without macular edema: Secondary | ICD-10-CM | POA: Diagnosis not present

## 2020-09-27 DIAGNOSIS — S32029A Unspecified fracture of second lumbar vertebra, initial encounter for closed fracture: Secondary | ICD-10-CM | POA: Diagnosis not present

## 2020-09-27 DIAGNOSIS — R0902 Hypoxemia: Secondary | ICD-10-CM | POA: Diagnosis not present

## 2020-09-27 DIAGNOSIS — Z741 Need for assistance with personal care: Secondary | ICD-10-CM | POA: Diagnosis not present

## 2020-09-27 DIAGNOSIS — M4814 Ankylosing hyperostosis [Forestier], thoracic region: Secondary | ICD-10-CM | POA: Diagnosis not present

## 2020-09-27 DIAGNOSIS — Z87891 Personal history of nicotine dependence: Secondary | ICD-10-CM | POA: Diagnosis not present

## 2020-09-27 DIAGNOSIS — Z7982 Long term (current) use of aspirin: Secondary | ICD-10-CM | POA: Diagnosis not present

## 2020-09-27 DIAGNOSIS — Z7409 Other reduced mobility: Secondary | ICD-10-CM | POA: Diagnosis not present

## 2020-09-27 DIAGNOSIS — Z743 Need for continuous supervision: Secondary | ICD-10-CM | POA: Diagnosis not present

## 2020-09-27 DIAGNOSIS — R55 Syncope and collapse: Secondary | ICD-10-CM | POA: Diagnosis not present

## 2020-09-27 DIAGNOSIS — I10 Essential (primary) hypertension: Secondary | ICD-10-CM | POA: Diagnosis not present

## 2020-09-27 DIAGNOSIS — S32019A Unspecified fracture of first lumbar vertebra, initial encounter for closed fracture: Secondary | ICD-10-CM | POA: Diagnosis not present

## 2020-09-27 DIAGNOSIS — S22031A Stable burst fracture of third thoracic vertebra, initial encounter for closed fracture: Secondary | ICD-10-CM | POA: Diagnosis not present

## 2020-09-27 DIAGNOSIS — Z79899 Other long term (current) drug therapy: Secondary | ICD-10-CM | POA: Diagnosis not present

## 2020-09-27 DIAGNOSIS — M154 Erosive (osteo)arthritis: Secondary | ICD-10-CM | POA: Diagnosis not present

## 2020-09-27 DIAGNOSIS — S34109D Unspecified injury to unspecified level of lumbar spinal cord, subsequent encounter: Secondary | ICD-10-CM | POA: Diagnosis not present

## 2020-09-29 ENCOUNTER — Encounter: Payer: Self-pay | Admitting: Family Medicine

## 2020-10-02 DIAGNOSIS — H43391 Other vitreous opacities, right eye: Secondary | ICD-10-CM | POA: Diagnosis not present

## 2020-10-02 DIAGNOSIS — S22031D Stable burst fracture of third thoracic vertebra, subsequent encounter for fracture with routine healing: Secondary | ICD-10-CM | POA: Diagnosis not present

## 2020-10-02 DIAGNOSIS — H02834 Dermatochalasis of left upper eyelid: Secondary | ICD-10-CM | POA: Diagnosis not present

## 2020-10-02 DIAGNOSIS — Z87891 Personal history of nicotine dependence: Secondary | ICD-10-CM | POA: Diagnosis not present

## 2020-10-02 DIAGNOSIS — H02831 Dermatochalasis of right upper eyelid: Secondary | ICD-10-CM | POA: Diagnosis not present

## 2020-10-02 DIAGNOSIS — M8580 Other specified disorders of bone density and structure, unspecified site: Secondary | ICD-10-CM | POA: Diagnosis not present

## 2020-10-02 DIAGNOSIS — Z9181 History of falling: Secondary | ICD-10-CM | POA: Diagnosis not present

## 2020-10-02 DIAGNOSIS — S32019D Unspecified fracture of first lumbar vertebra, subsequent encounter for fracture with routine healing: Secondary | ICD-10-CM | POA: Diagnosis not present

## 2020-10-02 DIAGNOSIS — S2241XD Multiple fractures of ribs, right side, subsequent encounter for fracture with routine healing: Secondary | ICD-10-CM | POA: Diagnosis not present

## 2020-10-02 DIAGNOSIS — S62307D Unspecified fracture of fifth metacarpal bone, left hand, subsequent encounter for fracture with routine healing: Secondary | ICD-10-CM | POA: Diagnosis not present

## 2020-10-02 DIAGNOSIS — E1136 Type 2 diabetes mellitus with diabetic cataract: Secondary | ICD-10-CM | POA: Diagnosis not present

## 2020-10-02 DIAGNOSIS — H524 Presbyopia: Secondary | ICD-10-CM | POA: Diagnosis not present

## 2020-10-02 DIAGNOSIS — M8448XD Pathological fracture, other site, subsequent encounter for fracture with routine healing: Secondary | ICD-10-CM | POA: Diagnosis not present

## 2020-10-02 DIAGNOSIS — H2510 Age-related nuclear cataract, unspecified eye: Secondary | ICD-10-CM | POA: Diagnosis not present

## 2020-10-02 DIAGNOSIS — Z7984 Long term (current) use of oral hypoglycemic drugs: Secondary | ICD-10-CM | POA: Diagnosis not present

## 2020-10-02 DIAGNOSIS — S62305D Unspecified fracture of fourth metacarpal bone, left hand, subsequent encounter for fracture with routine healing: Secondary | ICD-10-CM | POA: Diagnosis not present

## 2020-10-02 DIAGNOSIS — W108XXD Fall (on) (from) other stairs and steps, subsequent encounter: Secondary | ICD-10-CM | POA: Diagnosis not present

## 2020-10-02 DIAGNOSIS — Z7982 Long term (current) use of aspirin: Secondary | ICD-10-CM | POA: Diagnosis not present

## 2020-10-02 DIAGNOSIS — E119 Type 2 diabetes mellitus without complications: Secondary | ICD-10-CM | POA: Diagnosis not present

## 2020-10-02 DIAGNOSIS — S32029D Unspecified fracture of second lumbar vertebra, subsequent encounter for fracture with routine healing: Secondary | ICD-10-CM | POA: Diagnosis not present

## 2020-10-02 DIAGNOSIS — Z993 Dependence on wheelchair: Secondary | ICD-10-CM | POA: Diagnosis not present

## 2020-10-02 DIAGNOSIS — H5203 Hypermetropia, bilateral: Secondary | ICD-10-CM | POA: Diagnosis not present

## 2020-10-02 DIAGNOSIS — M19042 Primary osteoarthritis, left hand: Secondary | ICD-10-CM | POA: Diagnosis not present

## 2020-10-02 DIAGNOSIS — H353132 Nonexudative age-related macular degeneration, bilateral, intermediate dry stage: Secondary | ICD-10-CM | POA: Diagnosis not present

## 2020-10-03 DIAGNOSIS — M19042 Primary osteoarthritis, left hand: Secondary | ICD-10-CM | POA: Diagnosis not present

## 2020-10-03 DIAGNOSIS — S62395D Other fracture of fourth metacarpal bone, left hand, subsequent encounter for fracture with routine healing: Secondary | ICD-10-CM | POA: Diagnosis not present

## 2020-10-03 DIAGNOSIS — S62307D Unspecified fracture of fifth metacarpal bone, left hand, subsequent encounter for fracture with routine healing: Secondary | ICD-10-CM | POA: Diagnosis not present

## 2020-10-03 DIAGNOSIS — S62305D Unspecified fracture of fourth metacarpal bone, left hand, subsequent encounter for fracture with routine healing: Secondary | ICD-10-CM | POA: Diagnosis not present

## 2020-10-03 DIAGNOSIS — S62397D Other fracture of fifth metacarpal bone, left hand, subsequent encounter for fracture with routine healing: Secondary | ICD-10-CM | POA: Diagnosis not present

## 2020-10-03 DIAGNOSIS — M8580 Other specified disorders of bone density and structure, unspecified site: Secondary | ICD-10-CM | POA: Diagnosis not present

## 2020-10-04 ENCOUNTER — Telehealth: Payer: Self-pay | Admitting: *Deleted

## 2020-10-04 ENCOUNTER — Telehealth: Payer: Self-pay

## 2020-10-04 DIAGNOSIS — S62307D Unspecified fracture of fifth metacarpal bone, left hand, subsequent encounter for fracture with routine healing: Secondary | ICD-10-CM | POA: Diagnosis not present

## 2020-10-04 DIAGNOSIS — S22031D Stable burst fracture of third thoracic vertebra, subsequent encounter for fracture with routine healing: Secondary | ICD-10-CM | POA: Diagnosis not present

## 2020-10-04 DIAGNOSIS — Z87891 Personal history of nicotine dependence: Secondary | ICD-10-CM | POA: Diagnosis not present

## 2020-10-04 DIAGNOSIS — S32019D Unspecified fracture of first lumbar vertebra, subsequent encounter for fracture with routine healing: Secondary | ICD-10-CM | POA: Diagnosis not present

## 2020-10-04 DIAGNOSIS — H5203 Hypermetropia, bilateral: Secondary | ICD-10-CM | POA: Diagnosis not present

## 2020-10-04 DIAGNOSIS — H524 Presbyopia: Secondary | ICD-10-CM | POA: Diagnosis not present

## 2020-10-04 DIAGNOSIS — Z9181 History of falling: Secondary | ICD-10-CM | POA: Diagnosis not present

## 2020-10-04 DIAGNOSIS — M19042 Primary osteoarthritis, left hand: Secondary | ICD-10-CM | POA: Diagnosis not present

## 2020-10-04 DIAGNOSIS — M8580 Other specified disorders of bone density and structure, unspecified site: Secondary | ICD-10-CM | POA: Diagnosis not present

## 2020-10-04 DIAGNOSIS — H2510 Age-related nuclear cataract, unspecified eye: Secondary | ICD-10-CM | POA: Diagnosis not present

## 2020-10-04 DIAGNOSIS — M8448XD Pathological fracture, other site, subsequent encounter for fracture with routine healing: Secondary | ICD-10-CM | POA: Diagnosis not present

## 2020-10-04 DIAGNOSIS — E1136 Type 2 diabetes mellitus with diabetic cataract: Secondary | ICD-10-CM | POA: Diagnosis not present

## 2020-10-04 DIAGNOSIS — H02834 Dermatochalasis of left upper eyelid: Secondary | ICD-10-CM | POA: Diagnosis not present

## 2020-10-04 DIAGNOSIS — Z7984 Long term (current) use of oral hypoglycemic drugs: Secondary | ICD-10-CM | POA: Diagnosis not present

## 2020-10-04 DIAGNOSIS — S2241XD Multiple fractures of ribs, right side, subsequent encounter for fracture with routine healing: Secondary | ICD-10-CM | POA: Diagnosis not present

## 2020-10-04 DIAGNOSIS — S32029D Unspecified fracture of second lumbar vertebra, subsequent encounter for fracture with routine healing: Secondary | ICD-10-CM | POA: Diagnosis not present

## 2020-10-04 DIAGNOSIS — Z7982 Long term (current) use of aspirin: Secondary | ICD-10-CM | POA: Diagnosis not present

## 2020-10-04 DIAGNOSIS — Z993 Dependence on wheelchair: Secondary | ICD-10-CM | POA: Diagnosis not present

## 2020-10-04 DIAGNOSIS — E119 Type 2 diabetes mellitus without complications: Secondary | ICD-10-CM | POA: Diagnosis not present

## 2020-10-04 DIAGNOSIS — S62305D Unspecified fracture of fourth metacarpal bone, left hand, subsequent encounter for fracture with routine healing: Secondary | ICD-10-CM | POA: Diagnosis not present

## 2020-10-04 DIAGNOSIS — H353132 Nonexudative age-related macular degeneration, bilateral, intermediate dry stage: Secondary | ICD-10-CM | POA: Diagnosis not present

## 2020-10-04 DIAGNOSIS — H02831 Dermatochalasis of right upper eyelid: Secondary | ICD-10-CM | POA: Diagnosis not present

## 2020-10-04 DIAGNOSIS — W108XXD Fall (on) (from) other stairs and steps, subsequent encounter: Secondary | ICD-10-CM | POA: Diagnosis not present

## 2020-10-04 DIAGNOSIS — H43391 Other vitreous opacities, right eye: Secondary | ICD-10-CM | POA: Diagnosis not present

## 2020-10-04 NOTE — Telephone Encounter (Signed)
LMTCB

## 2020-10-04 NOTE — Telephone Encounter (Addendum)
Kayla OT/[facility] left a voicemail stating that she would like verbal orders to see patient once a week for eight weeks for OT Kayla stated that patient has stitches in his ear and would like an order for her or the PT to remove them. Kayla requested a call back.

## 2020-10-04 NOTE — Telephone Encounter (Signed)
Pt left v/m requesting cb to schedule A1 C. Sending note to [facility] support pool.

## 2020-10-05 NOTE — Telephone Encounter (Signed)
Called Kayla and LMTCB

## 2020-10-05 NOTE — Telephone Encounter (Signed)
Please give order for both.  Thanks.

## 2020-10-06 ENCOUNTER — Other Ambulatory Visit: Payer: Self-pay | Admitting: Infectious Disease

## 2020-10-07 DIAGNOSIS — R6889 Other general symptoms and signs: Secondary | ICD-10-CM | POA: Diagnosis not present

## 2020-10-07 DIAGNOSIS — S22028D Other fracture of second thoracic vertebra, subsequent encounter for fracture with routine healing: Secondary | ICD-10-CM | POA: Diagnosis not present

## 2020-10-07 DIAGNOSIS — S0990XA Unspecified injury of head, initial encounter: Secondary | ICD-10-CM | POA: Diagnosis not present

## 2020-10-07 DIAGNOSIS — Z743 Need for continuous supervision: Secondary | ICD-10-CM | POA: Diagnosis not present

## 2020-10-07 DIAGNOSIS — Z87891 Personal history of nicotine dependence: Secondary | ICD-10-CM | POA: Diagnosis not present

## 2020-10-07 DIAGNOSIS — R0902 Hypoxemia: Secondary | ICD-10-CM | POA: Diagnosis not present

## 2020-10-07 DIAGNOSIS — S0003XA Contusion of scalp, initial encounter: Secondary | ICD-10-CM | POA: Diagnosis not present

## 2020-10-07 DIAGNOSIS — J9811 Atelectasis: Secondary | ICD-10-CM | POA: Diagnosis not present

## 2020-10-07 DIAGNOSIS — S22031A Stable burst fracture of third thoracic vertebra, initial encounter for closed fracture: Secondary | ICD-10-CM | POA: Diagnosis not present

## 2020-10-07 DIAGNOSIS — S22029A Unspecified fracture of second thoracic vertebra, initial encounter for closed fracture: Secondary | ICD-10-CM | POA: Diagnosis not present

## 2020-10-07 DIAGNOSIS — R0781 Pleurodynia: Secondary | ICD-10-CM | POA: Diagnosis not present

## 2020-10-07 DIAGNOSIS — S22030A Wedge compression fracture of third thoracic vertebra, initial encounter for closed fracture: Secondary | ICD-10-CM | POA: Diagnosis not present

## 2020-10-07 DIAGNOSIS — S22032D Unstable burst fracture of third thoracic vertebra, subsequent encounter for fracture with routine healing: Secondary | ICD-10-CM | POA: Diagnosis not present

## 2020-10-07 DIAGNOSIS — W01198D Fall on same level from slipping, tripping and stumbling with subsequent striking against other object, subsequent encounter: Secondary | ICD-10-CM | POA: Diagnosis not present

## 2020-10-07 DIAGNOSIS — W19XXXA Unspecified fall, initial encounter: Secondary | ICD-10-CM | POA: Diagnosis not present

## 2020-10-07 DIAGNOSIS — S2241XA Multiple fractures of ribs, right side, initial encounter for closed fracture: Secondary | ICD-10-CM | POA: Diagnosis not present

## 2020-10-07 DIAGNOSIS — S22089A Unspecified fracture of T11-T12 vertebra, initial encounter for closed fracture: Secondary | ICD-10-CM | POA: Diagnosis not present

## 2020-10-07 DIAGNOSIS — R0789 Other chest pain: Secondary | ICD-10-CM | POA: Diagnosis not present

## 2020-10-07 DIAGNOSIS — S2231XA Fracture of one rib, right side, initial encounter for closed fracture: Secondary | ICD-10-CM | POA: Diagnosis not present

## 2020-10-07 DIAGNOSIS — Z043 Encounter for examination and observation following other accident: Secondary | ICD-10-CM | POA: Diagnosis not present

## 2020-10-07 DIAGNOSIS — R079 Chest pain, unspecified: Secondary | ICD-10-CM | POA: Diagnosis not present

## 2020-10-07 NOTE — Telephone Encounter (Signed)
Verbal orders given to [facility] with [facility].

## 2020-10-08 DIAGNOSIS — S22029A Unspecified fracture of second thoracic vertebra, initial encounter for closed fracture: Secondary | ICD-10-CM | POA: Diagnosis not present

## 2020-10-08 DIAGNOSIS — Z7401 Bed confinement status: Secondary | ICD-10-CM | POA: Diagnosis not present

## 2020-10-08 DIAGNOSIS — R531 Weakness: Secondary | ICD-10-CM | POA: Diagnosis not present

## 2020-10-08 DIAGNOSIS — Z043 Encounter for examination and observation following other accident: Secondary | ICD-10-CM | POA: Diagnosis not present

## 2020-10-08 DIAGNOSIS — S22031A Stable burst fracture of third thoracic vertebra, initial encounter for closed fracture: Secondary | ICD-10-CM | POA: Diagnosis not present

## 2020-10-08 DIAGNOSIS — S22030A Wedge compression fracture of third thoracic vertebra, initial encounter for closed fracture: Secondary | ICD-10-CM | POA: Diagnosis not present

## 2020-10-08 DIAGNOSIS — S0990XA Unspecified injury of head, initial encounter: Secondary | ICD-10-CM | POA: Diagnosis not present

## 2020-10-08 DIAGNOSIS — S22089A Unspecified fracture of T11-T12 vertebra, initial encounter for closed fracture: Secondary | ICD-10-CM | POA: Diagnosis not present

## 2020-10-08 DIAGNOSIS — Z743 Need for continuous supervision: Secondary | ICD-10-CM | POA: Diagnosis not present

## 2020-10-08 DIAGNOSIS — R079 Chest pain, unspecified: Secondary | ICD-10-CM | POA: Diagnosis not present

## 2020-10-11 NOTE — Telephone Encounter (Addendum)
Mr. Brashier returned call about scheduling his A1c. Noted per chart his A1c was completed on 09/16/20. This is not usually repeated until 3 months. He states he recently broke his back so it is difficult for him to come to the office. Let him know we would call back if labs were needed.  Phil Dopp, PharmD Clinical Pharmacist [facility] at [name] [phone]

## 2020-10-12 DIAGNOSIS — H02831 Dermatochalasis of right upper eyelid: Secondary | ICD-10-CM | POA: Diagnosis not present

## 2020-10-12 DIAGNOSIS — S2241XD Multiple fractures of ribs, right side, subsequent encounter for fracture with routine healing: Secondary | ICD-10-CM | POA: Diagnosis not present

## 2020-10-12 DIAGNOSIS — M8448XD Pathological fracture, other site, subsequent encounter for fracture with routine healing: Secondary | ICD-10-CM | POA: Diagnosis not present

## 2020-10-12 DIAGNOSIS — Z7982 Long term (current) use of aspirin: Secondary | ICD-10-CM | POA: Diagnosis not present

## 2020-10-12 DIAGNOSIS — S32029D Unspecified fracture of second lumbar vertebra, subsequent encounter for fracture with routine healing: Secondary | ICD-10-CM | POA: Diagnosis not present

## 2020-10-12 DIAGNOSIS — Z87891 Personal history of nicotine dependence: Secondary | ICD-10-CM | POA: Diagnosis not present

## 2020-10-12 DIAGNOSIS — M8580 Other specified disorders of bone density and structure, unspecified site: Secondary | ICD-10-CM | POA: Diagnosis not present

## 2020-10-12 DIAGNOSIS — W108XXD Fall (on) (from) other stairs and steps, subsequent encounter: Secondary | ICD-10-CM | POA: Diagnosis not present

## 2020-10-12 DIAGNOSIS — E119 Type 2 diabetes mellitus without complications: Secondary | ICD-10-CM | POA: Diagnosis not present

## 2020-10-12 DIAGNOSIS — E1136 Type 2 diabetes mellitus with diabetic cataract: Secondary | ICD-10-CM | POA: Diagnosis not present

## 2020-10-12 DIAGNOSIS — H43391 Other vitreous opacities, right eye: Secondary | ICD-10-CM | POA: Diagnosis not present

## 2020-10-12 DIAGNOSIS — M19042 Primary osteoarthritis, left hand: Secondary | ICD-10-CM | POA: Diagnosis not present

## 2020-10-12 DIAGNOSIS — Z9181 History of falling: Secondary | ICD-10-CM | POA: Diagnosis not present

## 2020-10-12 DIAGNOSIS — Z7984 Long term (current) use of oral hypoglycemic drugs: Secondary | ICD-10-CM | POA: Diagnosis not present

## 2020-10-12 DIAGNOSIS — S62307D Unspecified fracture of fifth metacarpal bone, left hand, subsequent encounter for fracture with routine healing: Secondary | ICD-10-CM | POA: Diagnosis not present

## 2020-10-12 DIAGNOSIS — H5203 Hypermetropia, bilateral: Secondary | ICD-10-CM | POA: Diagnosis not present

## 2020-10-12 DIAGNOSIS — H2510 Age-related nuclear cataract, unspecified eye: Secondary | ICD-10-CM | POA: Diagnosis not present

## 2020-10-12 DIAGNOSIS — S32019D Unspecified fracture of first lumbar vertebra, subsequent encounter for fracture with routine healing: Secondary | ICD-10-CM | POA: Diagnosis not present

## 2020-10-12 DIAGNOSIS — Z993 Dependence on wheelchair: Secondary | ICD-10-CM | POA: Diagnosis not present

## 2020-10-12 DIAGNOSIS — S22031D Stable burst fracture of third thoracic vertebra, subsequent encounter for fracture with routine healing: Secondary | ICD-10-CM | POA: Diagnosis not present

## 2020-10-12 DIAGNOSIS — H353132 Nonexudative age-related macular degeneration, bilateral, intermediate dry stage: Secondary | ICD-10-CM | POA: Diagnosis not present

## 2020-10-12 DIAGNOSIS — S62305D Unspecified fracture of fourth metacarpal bone, left hand, subsequent encounter for fracture with routine healing: Secondary | ICD-10-CM | POA: Diagnosis not present

## 2020-10-12 DIAGNOSIS — H02834 Dermatochalasis of left upper eyelid: Secondary | ICD-10-CM | POA: Diagnosis not present

## 2020-10-12 DIAGNOSIS — H524 Presbyopia: Secondary | ICD-10-CM | POA: Diagnosis not present

## 2020-10-12 NOTE — Telephone Encounter (Signed)
Patient advised. Appointment made for 02/15/20.

## 2020-10-12 NOTE — Telephone Encounter (Addendum)
I saw his ER note.  I hope he feels better soon.  Given the recent A1c, I wouldn't recheck it for 3-4 months.  I think that f/u here in about 3-4 months would be reasonable.  Please update me as needed in the meantime.

## 2020-10-13 DIAGNOSIS — Z7984 Long term (current) use of oral hypoglycemic drugs: Secondary | ICD-10-CM | POA: Diagnosis not present

## 2020-10-13 DIAGNOSIS — Z87891 Personal history of nicotine dependence: Secondary | ICD-10-CM | POA: Diagnosis not present

## 2020-10-13 DIAGNOSIS — H02831 Dermatochalasis of right upper eyelid: Secondary | ICD-10-CM | POA: Diagnosis not present

## 2020-10-13 DIAGNOSIS — Z9181 History of falling: Secondary | ICD-10-CM | POA: Diagnosis not present

## 2020-10-13 DIAGNOSIS — Z7982 Long term (current) use of aspirin: Secondary | ICD-10-CM | POA: Diagnosis not present

## 2020-10-13 DIAGNOSIS — S32029D Unspecified fracture of second lumbar vertebra, subsequent encounter for fracture with routine healing: Secondary | ICD-10-CM | POA: Diagnosis not present

## 2020-10-13 DIAGNOSIS — E119 Type 2 diabetes mellitus without complications: Secondary | ICD-10-CM | POA: Diagnosis not present

## 2020-10-13 DIAGNOSIS — H5203 Hypermetropia, bilateral: Secondary | ICD-10-CM | POA: Diagnosis not present

## 2020-10-13 DIAGNOSIS — H2510 Age-related nuclear cataract, unspecified eye: Secondary | ICD-10-CM | POA: Diagnosis not present

## 2020-10-13 DIAGNOSIS — M8580 Other specified disorders of bone density and structure, unspecified site: Secondary | ICD-10-CM | POA: Diagnosis not present

## 2020-10-13 DIAGNOSIS — S62305D Unspecified fracture of fourth metacarpal bone, left hand, subsequent encounter for fracture with routine healing: Secondary | ICD-10-CM | POA: Diagnosis not present

## 2020-10-13 DIAGNOSIS — H43391 Other vitreous opacities, right eye: Secondary | ICD-10-CM | POA: Diagnosis not present

## 2020-10-13 DIAGNOSIS — S22031D Stable burst fracture of third thoracic vertebra, subsequent encounter for fracture with routine healing: Secondary | ICD-10-CM | POA: Diagnosis not present

## 2020-10-13 DIAGNOSIS — H353132 Nonexudative age-related macular degeneration, bilateral, intermediate dry stage: Secondary | ICD-10-CM | POA: Diagnosis not present

## 2020-10-13 DIAGNOSIS — S2241XD Multiple fractures of ribs, right side, subsequent encounter for fracture with routine healing: Secondary | ICD-10-CM | POA: Diagnosis not present

## 2020-10-13 DIAGNOSIS — Z993 Dependence on wheelchair: Secondary | ICD-10-CM | POA: Diagnosis not present

## 2020-10-13 DIAGNOSIS — H02834 Dermatochalasis of left upper eyelid: Secondary | ICD-10-CM | POA: Diagnosis not present

## 2020-10-13 DIAGNOSIS — M19042 Primary osteoarthritis, left hand: Secondary | ICD-10-CM | POA: Diagnosis not present

## 2020-10-13 DIAGNOSIS — S62307D Unspecified fracture of fifth metacarpal bone, left hand, subsequent encounter for fracture with routine healing: Secondary | ICD-10-CM | POA: Diagnosis not present

## 2020-10-13 DIAGNOSIS — H524 Presbyopia: Secondary | ICD-10-CM | POA: Diagnosis not present

## 2020-10-13 DIAGNOSIS — M8448XD Pathological fracture, other site, subsequent encounter for fracture with routine healing: Secondary | ICD-10-CM | POA: Diagnosis not present

## 2020-10-13 DIAGNOSIS — E1136 Type 2 diabetes mellitus with diabetic cataract: Secondary | ICD-10-CM | POA: Diagnosis not present

## 2020-10-13 DIAGNOSIS — S32019D Unspecified fracture of first lumbar vertebra, subsequent encounter for fracture with routine healing: Secondary | ICD-10-CM | POA: Diagnosis not present

## 2020-10-13 DIAGNOSIS — W108XXD Fall (on) (from) other stairs and steps, subsequent encounter: Secondary | ICD-10-CM | POA: Diagnosis not present

## 2020-10-17 DIAGNOSIS — S62307D Unspecified fracture of fifth metacarpal bone, left hand, subsequent encounter for fracture with routine healing: Secondary | ICD-10-CM | POA: Diagnosis not present

## 2020-10-17 DIAGNOSIS — H5203 Hypermetropia, bilateral: Secondary | ICD-10-CM | POA: Diagnosis not present

## 2020-10-17 DIAGNOSIS — Z7982 Long term (current) use of aspirin: Secondary | ICD-10-CM | POA: Diagnosis not present

## 2020-10-17 DIAGNOSIS — W108XXD Fall (on) (from) other stairs and steps, subsequent encounter: Secondary | ICD-10-CM | POA: Diagnosis not present

## 2020-10-17 DIAGNOSIS — M8580 Other specified disorders of bone density and structure, unspecified site: Secondary | ICD-10-CM | POA: Diagnosis not present

## 2020-10-17 DIAGNOSIS — Z993 Dependence on wheelchair: Secondary | ICD-10-CM | POA: Diagnosis not present

## 2020-10-17 DIAGNOSIS — E119 Type 2 diabetes mellitus without complications: Secondary | ICD-10-CM | POA: Diagnosis not present

## 2020-10-17 DIAGNOSIS — H02831 Dermatochalasis of right upper eyelid: Secondary | ICD-10-CM | POA: Diagnosis not present

## 2020-10-17 DIAGNOSIS — Z87891 Personal history of nicotine dependence: Secondary | ICD-10-CM | POA: Diagnosis not present

## 2020-10-17 DIAGNOSIS — Z7984 Long term (current) use of oral hypoglycemic drugs: Secondary | ICD-10-CM | POA: Diagnosis not present

## 2020-10-17 DIAGNOSIS — M19042 Primary osteoarthritis, left hand: Secondary | ICD-10-CM | POA: Diagnosis not present

## 2020-10-17 DIAGNOSIS — H524 Presbyopia: Secondary | ICD-10-CM | POA: Diagnosis not present

## 2020-10-17 DIAGNOSIS — H2510 Age-related nuclear cataract, unspecified eye: Secondary | ICD-10-CM | POA: Diagnosis not present

## 2020-10-17 DIAGNOSIS — E1136 Type 2 diabetes mellitus with diabetic cataract: Secondary | ICD-10-CM | POA: Diagnosis not present

## 2020-10-17 DIAGNOSIS — H02834 Dermatochalasis of left upper eyelid: Secondary | ICD-10-CM | POA: Diagnosis not present

## 2020-10-17 DIAGNOSIS — S32019D Unspecified fracture of first lumbar vertebra, subsequent encounter for fracture with routine healing: Secondary | ICD-10-CM | POA: Diagnosis not present

## 2020-10-17 DIAGNOSIS — Z9181 History of falling: Secondary | ICD-10-CM | POA: Diagnosis not present

## 2020-10-17 DIAGNOSIS — S2241XD Multiple fractures of ribs, right side, subsequent encounter for fracture with routine healing: Secondary | ICD-10-CM | POA: Diagnosis not present

## 2020-10-17 DIAGNOSIS — S22031D Stable burst fracture of third thoracic vertebra, subsequent encounter for fracture with routine healing: Secondary | ICD-10-CM | POA: Diagnosis not present

## 2020-10-17 DIAGNOSIS — H353132 Nonexudative age-related macular degeneration, bilateral, intermediate dry stage: Secondary | ICD-10-CM | POA: Diagnosis not present

## 2020-10-17 DIAGNOSIS — H43391 Other vitreous opacities, right eye: Secondary | ICD-10-CM | POA: Diagnosis not present

## 2020-10-17 DIAGNOSIS — M8448XD Pathological fracture, other site, subsequent encounter for fracture with routine healing: Secondary | ICD-10-CM | POA: Diagnosis not present

## 2020-10-17 DIAGNOSIS — S62305D Unspecified fracture of fourth metacarpal bone, left hand, subsequent encounter for fracture with routine healing: Secondary | ICD-10-CM | POA: Diagnosis not present

## 2020-10-17 DIAGNOSIS — S32029D Unspecified fracture of second lumbar vertebra, subsequent encounter for fracture with routine healing: Secondary | ICD-10-CM | POA: Diagnosis not present

## 2020-10-18 DIAGNOSIS — S62305D Unspecified fracture of fourth metacarpal bone, left hand, subsequent encounter for fracture with routine healing: Secondary | ICD-10-CM | POA: Diagnosis not present

## 2020-10-18 DIAGNOSIS — M8580 Other specified disorders of bone density and structure, unspecified site: Secondary | ICD-10-CM | POA: Diagnosis not present

## 2020-10-18 DIAGNOSIS — M19042 Primary osteoarthritis, left hand: Secondary | ICD-10-CM | POA: Diagnosis not present

## 2020-10-18 DIAGNOSIS — Z993 Dependence on wheelchair: Secondary | ICD-10-CM | POA: Diagnosis not present

## 2020-10-18 DIAGNOSIS — S32019D Unspecified fracture of first lumbar vertebra, subsequent encounter for fracture with routine healing: Secondary | ICD-10-CM | POA: Diagnosis not present

## 2020-10-18 DIAGNOSIS — Z9181 History of falling: Secondary | ICD-10-CM | POA: Diagnosis not present

## 2020-10-18 DIAGNOSIS — H353132 Nonexudative age-related macular degeneration, bilateral, intermediate dry stage: Secondary | ICD-10-CM | POA: Diagnosis not present

## 2020-10-18 DIAGNOSIS — Z87891 Personal history of nicotine dependence: Secondary | ICD-10-CM | POA: Diagnosis not present

## 2020-10-18 DIAGNOSIS — W108XXD Fall (on) (from) other stairs and steps, subsequent encounter: Secondary | ICD-10-CM | POA: Diagnosis not present

## 2020-10-18 DIAGNOSIS — S62307D Unspecified fracture of fifth metacarpal bone, left hand, subsequent encounter for fracture with routine healing: Secondary | ICD-10-CM | POA: Diagnosis not present

## 2020-10-18 DIAGNOSIS — H43391 Other vitreous opacities, right eye: Secondary | ICD-10-CM | POA: Diagnosis not present

## 2020-10-18 DIAGNOSIS — Z7982 Long term (current) use of aspirin: Secondary | ICD-10-CM | POA: Diagnosis not present

## 2020-10-18 DIAGNOSIS — H02831 Dermatochalasis of right upper eyelid: Secondary | ICD-10-CM | POA: Diagnosis not present

## 2020-10-18 DIAGNOSIS — H2510 Age-related nuclear cataract, unspecified eye: Secondary | ICD-10-CM | POA: Diagnosis not present

## 2020-10-18 DIAGNOSIS — E119 Type 2 diabetes mellitus without complications: Secondary | ICD-10-CM | POA: Diagnosis not present

## 2020-10-18 DIAGNOSIS — M8448XD Pathological fracture, other site, subsequent encounter for fracture with routine healing: Secondary | ICD-10-CM | POA: Diagnosis not present

## 2020-10-18 DIAGNOSIS — H524 Presbyopia: Secondary | ICD-10-CM | POA: Diagnosis not present

## 2020-10-18 DIAGNOSIS — H5203 Hypermetropia, bilateral: Secondary | ICD-10-CM | POA: Diagnosis not present

## 2020-10-18 DIAGNOSIS — Z7984 Long term (current) use of oral hypoglycemic drugs: Secondary | ICD-10-CM | POA: Diagnosis not present

## 2020-10-18 DIAGNOSIS — E1136 Type 2 diabetes mellitus with diabetic cataract: Secondary | ICD-10-CM | POA: Diagnosis not present

## 2020-10-18 DIAGNOSIS — S32029D Unspecified fracture of second lumbar vertebra, subsequent encounter for fracture with routine healing: Secondary | ICD-10-CM | POA: Diagnosis not present

## 2020-10-18 DIAGNOSIS — S2241XD Multiple fractures of ribs, right side, subsequent encounter for fracture with routine healing: Secondary | ICD-10-CM | POA: Diagnosis not present

## 2020-10-18 DIAGNOSIS — H02834 Dermatochalasis of left upper eyelid: Secondary | ICD-10-CM | POA: Diagnosis not present

## 2020-10-18 DIAGNOSIS — S22031D Stable burst fracture of third thoracic vertebra, subsequent encounter for fracture with routine healing: Secondary | ICD-10-CM | POA: Diagnosis not present

## 2020-10-24 DIAGNOSIS — H5203 Hypermetropia, bilateral: Secondary | ICD-10-CM | POA: Diagnosis not present

## 2020-10-24 DIAGNOSIS — Z7984 Long term (current) use of oral hypoglycemic drugs: Secondary | ICD-10-CM | POA: Diagnosis not present

## 2020-10-24 DIAGNOSIS — H353132 Nonexudative age-related macular degeneration, bilateral, intermediate dry stage: Secondary | ICD-10-CM | POA: Diagnosis not present

## 2020-10-24 DIAGNOSIS — S32019D Unspecified fracture of first lumbar vertebra, subsequent encounter for fracture with routine healing: Secondary | ICD-10-CM | POA: Diagnosis not present

## 2020-10-24 DIAGNOSIS — M8448XD Pathological fracture, other site, subsequent encounter for fracture with routine healing: Secondary | ICD-10-CM | POA: Diagnosis not present

## 2020-10-24 DIAGNOSIS — Z993 Dependence on wheelchair: Secondary | ICD-10-CM | POA: Diagnosis not present

## 2020-10-24 DIAGNOSIS — Z87891 Personal history of nicotine dependence: Secondary | ICD-10-CM | POA: Diagnosis not present

## 2020-10-24 DIAGNOSIS — W108XXD Fall (on) (from) other stairs and steps, subsequent encounter: Secondary | ICD-10-CM | POA: Diagnosis not present

## 2020-10-24 DIAGNOSIS — H02831 Dermatochalasis of right upper eyelid: Secondary | ICD-10-CM | POA: Diagnosis not present

## 2020-10-24 DIAGNOSIS — H2510 Age-related nuclear cataract, unspecified eye: Secondary | ICD-10-CM | POA: Diagnosis not present

## 2020-10-24 DIAGNOSIS — E119 Type 2 diabetes mellitus without complications: Secondary | ICD-10-CM | POA: Diagnosis not present

## 2020-10-24 DIAGNOSIS — S22031D Stable burst fracture of third thoracic vertebra, subsequent encounter for fracture with routine healing: Secondary | ICD-10-CM | POA: Diagnosis not present

## 2020-10-24 DIAGNOSIS — M19042 Primary osteoarthritis, left hand: Secondary | ICD-10-CM | POA: Diagnosis not present

## 2020-10-24 DIAGNOSIS — S62307D Unspecified fracture of fifth metacarpal bone, left hand, subsequent encounter for fracture with routine healing: Secondary | ICD-10-CM | POA: Diagnosis not present

## 2020-10-24 DIAGNOSIS — H02834 Dermatochalasis of left upper eyelid: Secondary | ICD-10-CM | POA: Diagnosis not present

## 2020-10-24 DIAGNOSIS — Z7982 Long term (current) use of aspirin: Secondary | ICD-10-CM | POA: Diagnosis not present

## 2020-10-24 DIAGNOSIS — E1136 Type 2 diabetes mellitus with diabetic cataract: Secondary | ICD-10-CM | POA: Diagnosis not present

## 2020-10-24 DIAGNOSIS — S2241XD Multiple fractures of ribs, right side, subsequent encounter for fracture with routine healing: Secondary | ICD-10-CM | POA: Diagnosis not present

## 2020-10-24 DIAGNOSIS — H524 Presbyopia: Secondary | ICD-10-CM | POA: Diagnosis not present

## 2020-10-24 DIAGNOSIS — H43391 Other vitreous opacities, right eye: Secondary | ICD-10-CM | POA: Diagnosis not present

## 2020-10-24 DIAGNOSIS — M8580 Other specified disorders of bone density and structure, unspecified site: Secondary | ICD-10-CM | POA: Diagnosis not present

## 2020-10-24 DIAGNOSIS — S32029D Unspecified fracture of second lumbar vertebra, subsequent encounter for fracture with routine healing: Secondary | ICD-10-CM | POA: Diagnosis not present

## 2020-10-24 DIAGNOSIS — S62305D Unspecified fracture of fourth metacarpal bone, left hand, subsequent encounter for fracture with routine healing: Secondary | ICD-10-CM | POA: Diagnosis not present

## 2020-10-24 DIAGNOSIS — Z9181 History of falling: Secondary | ICD-10-CM | POA: Diagnosis not present

## 2020-10-25 DIAGNOSIS — M4013 Other secondary kyphosis, cervicothoracic region: Secondary | ICD-10-CM | POA: Diagnosis not present

## 2020-10-25 DIAGNOSIS — M438X4 Other specified deforming dorsopathies, thoracic region: Secondary | ICD-10-CM | POA: Diagnosis not present

## 2020-10-25 DIAGNOSIS — S22020A Wedge compression fracture of second thoracic vertebra, initial encounter for closed fracture: Secondary | ICD-10-CM | POA: Diagnosis not present

## 2020-10-25 DIAGNOSIS — S22030D Wedge compression fracture of third thoracic vertebra, subsequent encounter for fracture with routine healing: Secondary | ICD-10-CM | POA: Diagnosis not present

## 2020-10-25 DIAGNOSIS — Z888 Allergy status to other drugs, medicaments and biological substances status: Secondary | ICD-10-CM | POA: Diagnosis not present

## 2020-10-25 DIAGNOSIS — M8588 Other specified disorders of bone density and structure, other site: Secondary | ICD-10-CM | POA: Diagnosis not present

## 2020-10-25 DIAGNOSIS — S22030A Wedge compression fracture of third thoracic vertebra, initial encounter for closed fracture: Secondary | ICD-10-CM | POA: Diagnosis not present

## 2020-10-25 DIAGNOSIS — S22020D Wedge compression fracture of second thoracic vertebra, subsequent encounter for fracture with routine healing: Secondary | ICD-10-CM | POA: Diagnosis not present

## 2020-10-25 DIAGNOSIS — M5134 Other intervertebral disc degeneration, thoracic region: Secondary | ICD-10-CM | POA: Diagnosis not present

## 2020-10-27 DIAGNOSIS — S22031D Stable burst fracture of third thoracic vertebra, subsequent encounter for fracture with routine healing: Secondary | ICD-10-CM | POA: Diagnosis not present

## 2020-10-27 DIAGNOSIS — H02831 Dermatochalasis of right upper eyelid: Secondary | ICD-10-CM | POA: Diagnosis not present

## 2020-10-27 DIAGNOSIS — Z87891 Personal history of nicotine dependence: Secondary | ICD-10-CM | POA: Diagnosis not present

## 2020-10-27 DIAGNOSIS — H524 Presbyopia: Secondary | ICD-10-CM | POA: Diagnosis not present

## 2020-10-27 DIAGNOSIS — S62307D Unspecified fracture of fifth metacarpal bone, left hand, subsequent encounter for fracture with routine healing: Secondary | ICD-10-CM | POA: Diagnosis not present

## 2020-10-27 DIAGNOSIS — M19042 Primary osteoarthritis, left hand: Secondary | ICD-10-CM | POA: Diagnosis not present

## 2020-10-27 DIAGNOSIS — H02834 Dermatochalasis of left upper eyelid: Secondary | ICD-10-CM | POA: Diagnosis not present

## 2020-10-27 DIAGNOSIS — H43391 Other vitreous opacities, right eye: Secondary | ICD-10-CM | POA: Diagnosis not present

## 2020-10-27 DIAGNOSIS — M8580 Other specified disorders of bone density and structure, unspecified site: Secondary | ICD-10-CM | POA: Diagnosis not present

## 2020-10-27 DIAGNOSIS — S62305D Unspecified fracture of fourth metacarpal bone, left hand, subsequent encounter for fracture with routine healing: Secondary | ICD-10-CM | POA: Diagnosis not present

## 2020-10-27 DIAGNOSIS — M8448XD Pathological fracture, other site, subsequent encounter for fracture with routine healing: Secondary | ICD-10-CM | POA: Diagnosis not present

## 2020-10-27 DIAGNOSIS — E1136 Type 2 diabetes mellitus with diabetic cataract: Secondary | ICD-10-CM | POA: Diagnosis not present

## 2020-10-27 DIAGNOSIS — Z993 Dependence on wheelchair: Secondary | ICD-10-CM | POA: Diagnosis not present

## 2020-10-27 DIAGNOSIS — W108XXD Fall (on) (from) other stairs and steps, subsequent encounter: Secondary | ICD-10-CM | POA: Diagnosis not present

## 2020-10-27 DIAGNOSIS — S32029D Unspecified fracture of second lumbar vertebra, subsequent encounter for fracture with routine healing: Secondary | ICD-10-CM | POA: Diagnosis not present

## 2020-10-27 DIAGNOSIS — S32019D Unspecified fracture of first lumbar vertebra, subsequent encounter for fracture with routine healing: Secondary | ICD-10-CM | POA: Diagnosis not present

## 2020-10-27 DIAGNOSIS — H5203 Hypermetropia, bilateral: Secondary | ICD-10-CM | POA: Diagnosis not present

## 2020-10-27 DIAGNOSIS — E119 Type 2 diabetes mellitus without complications: Secondary | ICD-10-CM | POA: Diagnosis not present

## 2020-10-27 DIAGNOSIS — H353132 Nonexudative age-related macular degeneration, bilateral, intermediate dry stage: Secondary | ICD-10-CM | POA: Diagnosis not present

## 2020-10-27 DIAGNOSIS — Z9181 History of falling: Secondary | ICD-10-CM | POA: Diagnosis not present

## 2020-10-27 DIAGNOSIS — Z7982 Long term (current) use of aspirin: Secondary | ICD-10-CM | POA: Diagnosis not present

## 2020-10-27 DIAGNOSIS — S2241XD Multiple fractures of ribs, right side, subsequent encounter for fracture with routine healing: Secondary | ICD-10-CM | POA: Diagnosis not present

## 2020-10-27 DIAGNOSIS — Z7984 Long term (current) use of oral hypoglycemic drugs: Secondary | ICD-10-CM | POA: Diagnosis not present

## 2020-10-27 DIAGNOSIS — H2510 Age-related nuclear cataract, unspecified eye: Secondary | ICD-10-CM | POA: Diagnosis not present

## 2020-10-28 DIAGNOSIS — S62307D Unspecified fracture of fifth metacarpal bone, left hand, subsequent encounter for fracture with routine healing: Secondary | ICD-10-CM | POA: Diagnosis not present

## 2020-10-28 DIAGNOSIS — E119 Type 2 diabetes mellitus without complications: Secondary | ICD-10-CM | POA: Diagnosis not present

## 2020-10-28 DIAGNOSIS — Z993 Dependence on wheelchair: Secondary | ICD-10-CM | POA: Diagnosis not present

## 2020-10-28 DIAGNOSIS — M19042 Primary osteoarthritis, left hand: Secondary | ICD-10-CM | POA: Diagnosis not present

## 2020-10-28 DIAGNOSIS — Z9181 History of falling: Secondary | ICD-10-CM | POA: Diagnosis not present

## 2020-10-28 DIAGNOSIS — S62305D Unspecified fracture of fourth metacarpal bone, left hand, subsequent encounter for fracture with routine healing: Secondary | ICD-10-CM | POA: Diagnosis not present

## 2020-10-28 DIAGNOSIS — H524 Presbyopia: Secondary | ICD-10-CM | POA: Diagnosis not present

## 2020-10-28 DIAGNOSIS — Z7984 Long term (current) use of oral hypoglycemic drugs: Secondary | ICD-10-CM | POA: Diagnosis not present

## 2020-10-28 DIAGNOSIS — S22031D Stable burst fracture of third thoracic vertebra, subsequent encounter for fracture with routine healing: Secondary | ICD-10-CM | POA: Diagnosis not present

## 2020-10-28 DIAGNOSIS — H43391 Other vitreous opacities, right eye: Secondary | ICD-10-CM | POA: Diagnosis not present

## 2020-10-28 DIAGNOSIS — Z87891 Personal history of nicotine dependence: Secondary | ICD-10-CM | POA: Diagnosis not present

## 2020-10-28 DIAGNOSIS — S32019D Unspecified fracture of first lumbar vertebra, subsequent encounter for fracture with routine healing: Secondary | ICD-10-CM | POA: Diagnosis not present

## 2020-10-28 DIAGNOSIS — H5203 Hypermetropia, bilateral: Secondary | ICD-10-CM | POA: Diagnosis not present

## 2020-10-28 DIAGNOSIS — E1136 Type 2 diabetes mellitus with diabetic cataract: Secondary | ICD-10-CM | POA: Diagnosis not present

## 2020-10-28 DIAGNOSIS — M8448XD Pathological fracture, other site, subsequent encounter for fracture with routine healing: Secondary | ICD-10-CM | POA: Diagnosis not present

## 2020-10-28 DIAGNOSIS — M8580 Other specified disorders of bone density and structure, unspecified site: Secondary | ICD-10-CM | POA: Diagnosis not present

## 2020-10-28 DIAGNOSIS — H353132 Nonexudative age-related macular degeneration, bilateral, intermediate dry stage: Secondary | ICD-10-CM | POA: Diagnosis not present

## 2020-10-28 DIAGNOSIS — W108XXD Fall (on) (from) other stairs and steps, subsequent encounter: Secondary | ICD-10-CM | POA: Diagnosis not present

## 2020-10-28 DIAGNOSIS — Z7982 Long term (current) use of aspirin: Secondary | ICD-10-CM | POA: Diagnosis not present

## 2020-10-28 DIAGNOSIS — S32029D Unspecified fracture of second lumbar vertebra, subsequent encounter for fracture with routine healing: Secondary | ICD-10-CM | POA: Diagnosis not present

## 2020-10-28 DIAGNOSIS — H02834 Dermatochalasis of left upper eyelid: Secondary | ICD-10-CM | POA: Diagnosis not present

## 2020-10-28 DIAGNOSIS — H02831 Dermatochalasis of right upper eyelid: Secondary | ICD-10-CM | POA: Diagnosis not present

## 2020-10-28 DIAGNOSIS — S2241XD Multiple fractures of ribs, right side, subsequent encounter for fracture with routine healing: Secondary | ICD-10-CM | POA: Diagnosis not present

## 2020-10-28 DIAGNOSIS — H2510 Age-related nuclear cataract, unspecified eye: Secondary | ICD-10-CM | POA: Diagnosis not present

## 2020-10-31 DIAGNOSIS — M19042 Primary osteoarthritis, left hand: Secondary | ICD-10-CM | POA: Diagnosis not present

## 2020-10-31 DIAGNOSIS — E119 Type 2 diabetes mellitus without complications: Secondary | ICD-10-CM | POA: Diagnosis not present

## 2020-10-31 DIAGNOSIS — S32029D Unspecified fracture of second lumbar vertebra, subsequent encounter for fracture with routine healing: Secondary | ICD-10-CM | POA: Diagnosis not present

## 2020-10-31 DIAGNOSIS — H43391 Other vitreous opacities, right eye: Secondary | ICD-10-CM | POA: Diagnosis not present

## 2020-10-31 DIAGNOSIS — S62305D Unspecified fracture of fourth metacarpal bone, left hand, subsequent encounter for fracture with routine healing: Secondary | ICD-10-CM | POA: Diagnosis not present

## 2020-10-31 DIAGNOSIS — H353132 Nonexudative age-related macular degeneration, bilateral, intermediate dry stage: Secondary | ICD-10-CM | POA: Diagnosis not present

## 2020-10-31 DIAGNOSIS — M8448XD Pathological fracture, other site, subsequent encounter for fracture with routine healing: Secondary | ICD-10-CM | POA: Diagnosis not present

## 2020-10-31 DIAGNOSIS — S2241XD Multiple fractures of ribs, right side, subsequent encounter for fracture with routine healing: Secondary | ICD-10-CM | POA: Diagnosis not present

## 2020-10-31 DIAGNOSIS — S22031D Stable burst fracture of third thoracic vertebra, subsequent encounter for fracture with routine healing: Secondary | ICD-10-CM | POA: Diagnosis not present

## 2020-10-31 DIAGNOSIS — W108XXD Fall (on) (from) other stairs and steps, subsequent encounter: Secondary | ICD-10-CM | POA: Diagnosis not present

## 2020-10-31 DIAGNOSIS — S62307D Unspecified fracture of fifth metacarpal bone, left hand, subsequent encounter for fracture with routine healing: Secondary | ICD-10-CM | POA: Diagnosis not present

## 2020-10-31 DIAGNOSIS — Z7982 Long term (current) use of aspirin: Secondary | ICD-10-CM | POA: Diagnosis not present

## 2020-10-31 DIAGNOSIS — H02834 Dermatochalasis of left upper eyelid: Secondary | ICD-10-CM | POA: Diagnosis not present

## 2020-10-31 DIAGNOSIS — S32019D Unspecified fracture of first lumbar vertebra, subsequent encounter for fracture with routine healing: Secondary | ICD-10-CM | POA: Diagnosis not present

## 2020-10-31 DIAGNOSIS — H02831 Dermatochalasis of right upper eyelid: Secondary | ICD-10-CM | POA: Diagnosis not present

## 2020-10-31 DIAGNOSIS — Z7984 Long term (current) use of oral hypoglycemic drugs: Secondary | ICD-10-CM | POA: Diagnosis not present

## 2020-10-31 DIAGNOSIS — Z993 Dependence on wheelchair: Secondary | ICD-10-CM | POA: Diagnosis not present

## 2020-10-31 DIAGNOSIS — Z9181 History of falling: Secondary | ICD-10-CM | POA: Diagnosis not present

## 2020-10-31 DIAGNOSIS — H524 Presbyopia: Secondary | ICD-10-CM | POA: Diagnosis not present

## 2020-10-31 DIAGNOSIS — E1136 Type 2 diabetes mellitus with diabetic cataract: Secondary | ICD-10-CM | POA: Diagnosis not present

## 2020-10-31 DIAGNOSIS — Z87891 Personal history of nicotine dependence: Secondary | ICD-10-CM | POA: Diagnosis not present

## 2020-10-31 DIAGNOSIS — M8580 Other specified disorders of bone density and structure, unspecified site: Secondary | ICD-10-CM | POA: Diagnosis not present

## 2020-10-31 DIAGNOSIS — H2510 Age-related nuclear cataract, unspecified eye: Secondary | ICD-10-CM | POA: Diagnosis not present

## 2020-10-31 DIAGNOSIS — H5203 Hypermetropia, bilateral: Secondary | ICD-10-CM | POA: Diagnosis not present

## 2020-11-03 DIAGNOSIS — M8448XD Pathological fracture, other site, subsequent encounter for fracture with routine healing: Secondary | ICD-10-CM | POA: Diagnosis not present

## 2020-11-03 DIAGNOSIS — Z9181 History of falling: Secondary | ICD-10-CM | POA: Diagnosis not present

## 2020-11-03 DIAGNOSIS — Z993 Dependence on wheelchair: Secondary | ICD-10-CM | POA: Diagnosis not present

## 2020-11-03 DIAGNOSIS — H524 Presbyopia: Secondary | ICD-10-CM | POA: Diagnosis not present

## 2020-11-03 DIAGNOSIS — H43391 Other vitreous opacities, right eye: Secondary | ICD-10-CM | POA: Diagnosis not present

## 2020-11-03 DIAGNOSIS — H353132 Nonexudative age-related macular degeneration, bilateral, intermediate dry stage: Secondary | ICD-10-CM | POA: Diagnosis not present

## 2020-11-03 DIAGNOSIS — S32029D Unspecified fracture of second lumbar vertebra, subsequent encounter for fracture with routine healing: Secondary | ICD-10-CM | POA: Diagnosis not present

## 2020-11-03 DIAGNOSIS — H02831 Dermatochalasis of right upper eyelid: Secondary | ICD-10-CM | POA: Diagnosis not present

## 2020-11-03 DIAGNOSIS — Z7982 Long term (current) use of aspirin: Secondary | ICD-10-CM | POA: Diagnosis not present

## 2020-11-03 DIAGNOSIS — W108XXD Fall (on) (from) other stairs and steps, subsequent encounter: Secondary | ICD-10-CM | POA: Diagnosis not present

## 2020-11-03 DIAGNOSIS — S2241XD Multiple fractures of ribs, right side, subsequent encounter for fracture with routine healing: Secondary | ICD-10-CM | POA: Diagnosis not present

## 2020-11-03 DIAGNOSIS — H5203 Hypermetropia, bilateral: Secondary | ICD-10-CM | POA: Diagnosis not present

## 2020-11-03 DIAGNOSIS — M8580 Other specified disorders of bone density and structure, unspecified site: Secondary | ICD-10-CM | POA: Diagnosis not present

## 2020-11-03 DIAGNOSIS — S62305D Unspecified fracture of fourth metacarpal bone, left hand, subsequent encounter for fracture with routine healing: Secondary | ICD-10-CM | POA: Diagnosis not present

## 2020-11-03 DIAGNOSIS — Z7984 Long term (current) use of oral hypoglycemic drugs: Secondary | ICD-10-CM | POA: Diagnosis not present

## 2020-11-03 DIAGNOSIS — S32019D Unspecified fracture of first lumbar vertebra, subsequent encounter for fracture with routine healing: Secondary | ICD-10-CM | POA: Diagnosis not present

## 2020-11-03 DIAGNOSIS — H02834 Dermatochalasis of left upper eyelid: Secondary | ICD-10-CM | POA: Diagnosis not present

## 2020-11-03 DIAGNOSIS — M19042 Primary osteoarthritis, left hand: Secondary | ICD-10-CM | POA: Diagnosis not present

## 2020-11-03 DIAGNOSIS — S62307D Unspecified fracture of fifth metacarpal bone, left hand, subsequent encounter for fracture with routine healing: Secondary | ICD-10-CM | POA: Diagnosis not present

## 2020-11-03 DIAGNOSIS — E119 Type 2 diabetes mellitus without complications: Secondary | ICD-10-CM | POA: Diagnosis not present

## 2020-11-03 DIAGNOSIS — S22031D Stable burst fracture of third thoracic vertebra, subsequent encounter for fracture with routine healing: Secondary | ICD-10-CM | POA: Diagnosis not present

## 2020-11-03 DIAGNOSIS — H2510 Age-related nuclear cataract, unspecified eye: Secondary | ICD-10-CM | POA: Diagnosis not present

## 2020-11-03 DIAGNOSIS — E1136 Type 2 diabetes mellitus with diabetic cataract: Secondary | ICD-10-CM | POA: Diagnosis not present

## 2020-11-03 DIAGNOSIS — Z87891 Personal history of nicotine dependence: Secondary | ICD-10-CM | POA: Diagnosis not present

## 2020-11-07 ENCOUNTER — Telehealth: Payer: Self-pay

## 2020-11-07 DIAGNOSIS — S62305D Unspecified fracture of fourth metacarpal bone, left hand, subsequent encounter for fracture with routine healing: Secondary | ICD-10-CM | POA: Diagnosis not present

## 2020-11-07 DIAGNOSIS — S62308D Unspecified fracture of other metacarpal bone, subsequent encounter for fracture with routine healing: Secondary | ICD-10-CM | POA: Diagnosis not present

## 2020-11-07 DIAGNOSIS — Z888 Allergy status to other drugs, medicaments and biological substances status: Secondary | ICD-10-CM | POA: Diagnosis not present

## 2020-11-07 DIAGNOSIS — S62347D Nondisplaced fracture of base of fifth metacarpal bone. left hand, subsequent encounter for fracture with routine healing: Secondary | ICD-10-CM | POA: Diagnosis not present

## 2020-11-07 DIAGNOSIS — M85841 Other specified disorders of bone density and structure, right hand: Secondary | ICD-10-CM | POA: Diagnosis not present

## 2020-11-07 DIAGNOSIS — M1812 Unilateral primary osteoarthritis of first carpometacarpal joint, left hand: Secondary | ICD-10-CM | POA: Diagnosis not present

## 2020-11-07 DIAGNOSIS — S62345D Nondisplaced fracture of base of fourth metacarpal bone, left hand, subsequent encounter for fracture with routine healing: Secondary | ICD-10-CM | POA: Diagnosis not present

## 2020-11-07 DIAGNOSIS — M154 Erosive (osteo)arthritis: Secondary | ICD-10-CM | POA: Diagnosis not present

## 2020-11-07 NOTE — Chronic Care Management (AMB) (Addendum)
Chronic Care Management Pharmacy Assistant   Name: Jacob Ponce  MRN: [id number] DOB: Dec 05, 1940   Reason for Encounter: Cholesterol Disease State     Recent office visits:  09/16/20-PCP-Patient presented for AWV.Labs ordered(Your A1c is slightly higher at 6.8 but this is still controlled.  Your lipids are fine)  Please let me know how you feel on the lower dose of doxazosin.Cut cardura back to 4mg  a day.  Let me know if that isn't helping.  Plan on recheck in 6 months, sooner if needed.    Recent consult visits:  10/03/20- [facility]- Patient presented for follow-up from hospital and fall with multiple spine fractures,left hand fracture, will place in cast , follow up 4 weeks.  Hospital visits:  10/07/20- [facility] ED- Patient presents after another fall at home,Labs ordered(abnormal CBC), CT scans ordered,no admission,follow up with Spine specialist 1 week. 09/23/20-[facility] ED- Patient presented after a fall down his steps at home-no data found  Medications: Outpatient Encounter Medications as of 11/07/2020  Medication Sig   aspirin 81 MG tablet Take 81 mg by mouth daily.   DESCOVY 200-25 MG tablet TAKE 1 TABLET BY MOUTH  DAILY   doxazosin (CARDURA) 8 MG tablet Take 0.5 tablets (4 mg total) by mouth at bedtime.   glucose blood (ONETOUCH ULTRA) test strip USE TO CHECK BLOOD SUGAR  DAILY   Lancets (ONETOUCH ULTRASOFT) lancets USE TO CHECK BLOOD SUGAR  DAILY   loratadine (CLARITIN) 10 MG tablet Take 10 mg by mouth daily as needed for allergies.   metFORMIN (GLUCOPHAGE) 500 MG tablet TAKE 1 TABLET BY MOUTH  TWICE DAILY WITH MEALS   Multiple Vitamins-Minerals (PRESERVISION AREDS 2 PO) Take by mouth in the morning and at bedtime.   pioglitazone (ACTOS) 30 MG tablet TAKE ONE-HALF TABLET BY  MOUTH DAILY   simvastatin (ZOCOR) 40 MG tablet TAKE 1 TABLET BY MOUTH AT  BEDTIME   No facility-administered encounter medications on file as of  11/07/2020.   11/07/2020 Name: YUREM VINER MRN: [name] DOB: 05/24/1940 CHAWN SPRAGGINS is a 80 y.o. year old male who is a primary care patient of 72, MD.  Comprehensive medication review performed; Spoke to patient regarding cholesterol   Contacted patient on 11/09/20 to discuss hyperlipidemia.  Lipid Panel    Component Value Date/Time   CHOL 134 09/16/2020 1105   TRIG 105.0 09/16/2020 1105   HDL 47.70 09/16/2020 1105   LDLCALC 65 09/16/2020 1105   LDLDIRECT 69 04/16/2008 0000    10-year ASCVD risk score: The 10-year ASCVD risk score (Arnett DK, et al., 2019) is: 66.1%   Values used to calculate the score:     Age: 80 years     Sex: Male     Is Non-Hispanic African American: No     Diabetic: Yes     Tobacco smoker: No     Systolic Blood Pressure: 160 mmHg     Is BP treated: Yes     HDL Cholesterol: 47.7 mg/dL     Total Cholesterol: 134 mg/dL  Current antihyperlipidemic regimen:  Simvastatin 40 mg - 1 tablet daily Aspirin 81 mg - 1 daily Previous antihyperlipidemic medications tried: none  ASCVD risk enhancing conditions: age >32, DM, and HTN  What recent interventions/DTPs have been made by any provider to improve Cholesterol control since last CPP Visit: none identified  Any recent hospitalizations or ED visits since last visit with CPP? Yes  What  diet changes have been made to improve Cholesterol?   The patient states he does limit his carbs.  What exercise is being done to improve Cholesterol?    He is getting OT at home for his hand, and currently in a lumbar brace healing 7 vertebral fractures.  Adherence Review: Does the patient have >5 day gap between last estimated fill dates? No  Annual wellness visit in last year? No  10/02/18 Most Recent BP reading: 141/82  85-P  10/03/20  If Diabetic: Most recent A1C reading:  6.8  09/16/20    7.0  09/26/20 Last eye exam / retinopathy screening: DUE - last 08/12/2019  Last diabetic foot exam:  up to date -  09/16/20  11/26/20 Infectious Disease appointment, PCP follow up scheduled for 02/14/2021.  Star Rating Drugs Medication Name/Dose: Last fill date Days supply Simvastatin 40mg   08/04/20 90 Actos 30mg    07/25/20 90 Metformin 500mg   08/04/20 90  The patient uses 07/27/20 and states he got refills last month and has a good supply of his medications and he does not miss any doses.  , CPP notified  08/06/20, [facility] Clincal Pharmacy Assistant [phone]  I have reviewed the care management and care coordination activities outlined in this encounter and I am certifying that I agree with the content of this note. No further action required.  Burt Knack, PharmD Clinical Pharmacist [facility] at [facility] [phone]

## 2020-11-08 DIAGNOSIS — H43391 Other vitreous opacities, right eye: Secondary | ICD-10-CM | POA: Diagnosis not present

## 2020-11-08 DIAGNOSIS — E119 Type 2 diabetes mellitus without complications: Secondary | ICD-10-CM | POA: Diagnosis not present

## 2020-11-08 DIAGNOSIS — S2241XD Multiple fractures of ribs, right side, subsequent encounter for fracture with routine healing: Secondary | ICD-10-CM | POA: Diagnosis not present

## 2020-11-08 DIAGNOSIS — S32019D Unspecified fracture of first lumbar vertebra, subsequent encounter for fracture with routine healing: Secondary | ICD-10-CM | POA: Diagnosis not present

## 2020-11-08 DIAGNOSIS — Z7984 Long term (current) use of oral hypoglycemic drugs: Secondary | ICD-10-CM | POA: Diagnosis not present

## 2020-11-08 DIAGNOSIS — Z9181 History of falling: Secondary | ICD-10-CM | POA: Diagnosis not present

## 2020-11-08 DIAGNOSIS — S22031D Stable burst fracture of third thoracic vertebra, subsequent encounter for fracture with routine healing: Secondary | ICD-10-CM | POA: Diagnosis not present

## 2020-11-08 DIAGNOSIS — H02831 Dermatochalasis of right upper eyelid: Secondary | ICD-10-CM | POA: Diagnosis not present

## 2020-11-08 DIAGNOSIS — Z993 Dependence on wheelchair: Secondary | ICD-10-CM | POA: Diagnosis not present

## 2020-11-08 DIAGNOSIS — H353132 Nonexudative age-related macular degeneration, bilateral, intermediate dry stage: Secondary | ICD-10-CM | POA: Diagnosis not present

## 2020-11-08 DIAGNOSIS — E1136 Type 2 diabetes mellitus with diabetic cataract: Secondary | ICD-10-CM | POA: Diagnosis not present

## 2020-11-08 DIAGNOSIS — H524 Presbyopia: Secondary | ICD-10-CM | POA: Diagnosis not present

## 2020-11-08 DIAGNOSIS — S62307D Unspecified fracture of fifth metacarpal bone, left hand, subsequent encounter for fracture with routine healing: Secondary | ICD-10-CM | POA: Diagnosis not present

## 2020-11-08 DIAGNOSIS — S32029D Unspecified fracture of second lumbar vertebra, subsequent encounter for fracture with routine healing: Secondary | ICD-10-CM | POA: Diagnosis not present

## 2020-11-08 DIAGNOSIS — Z87891 Personal history of nicotine dependence: Secondary | ICD-10-CM | POA: Diagnosis not present

## 2020-11-08 DIAGNOSIS — M19042 Primary osteoarthritis, left hand: Secondary | ICD-10-CM | POA: Diagnosis not present

## 2020-11-08 DIAGNOSIS — W108XXD Fall (on) (from) other stairs and steps, subsequent encounter: Secondary | ICD-10-CM | POA: Diagnosis not present

## 2020-11-08 DIAGNOSIS — M8448XD Pathological fracture, other site, subsequent encounter for fracture with routine healing: Secondary | ICD-10-CM | POA: Diagnosis not present

## 2020-11-08 DIAGNOSIS — S62305D Unspecified fracture of fourth metacarpal bone, left hand, subsequent encounter for fracture with routine healing: Secondary | ICD-10-CM | POA: Diagnosis not present

## 2020-11-08 DIAGNOSIS — H2510 Age-related nuclear cataract, unspecified eye: Secondary | ICD-10-CM | POA: Diagnosis not present

## 2020-11-08 DIAGNOSIS — H02834 Dermatochalasis of left upper eyelid: Secondary | ICD-10-CM | POA: Diagnosis not present

## 2020-11-08 DIAGNOSIS — H5203 Hypermetropia, bilateral: Secondary | ICD-10-CM | POA: Diagnosis not present

## 2020-11-08 DIAGNOSIS — M8580 Other specified disorders of bone density and structure, unspecified site: Secondary | ICD-10-CM | POA: Diagnosis not present

## 2020-11-08 DIAGNOSIS — Z7982 Long term (current) use of aspirin: Secondary | ICD-10-CM | POA: Diagnosis not present

## 2020-11-09 ENCOUNTER — Other Ambulatory Visit: Payer: Self-pay | Admitting: Infectious Disease

## 2020-11-09 DIAGNOSIS — H02834 Dermatochalasis of left upper eyelid: Secondary | ICD-10-CM | POA: Diagnosis not present

## 2020-11-09 DIAGNOSIS — E119 Type 2 diabetes mellitus without complications: Secondary | ICD-10-CM | POA: Diagnosis not present

## 2020-11-09 DIAGNOSIS — Z7984 Long term (current) use of oral hypoglycemic drugs: Secondary | ICD-10-CM | POA: Diagnosis not present

## 2020-11-09 DIAGNOSIS — H43391 Other vitreous opacities, right eye: Secondary | ICD-10-CM | POA: Diagnosis not present

## 2020-11-09 DIAGNOSIS — S2241XD Multiple fractures of ribs, right side, subsequent encounter for fracture with routine healing: Secondary | ICD-10-CM | POA: Diagnosis not present

## 2020-11-09 DIAGNOSIS — H524 Presbyopia: Secondary | ICD-10-CM | POA: Diagnosis not present

## 2020-11-09 DIAGNOSIS — E1136 Type 2 diabetes mellitus with diabetic cataract: Secondary | ICD-10-CM | POA: Diagnosis not present

## 2020-11-09 DIAGNOSIS — Z7982 Long term (current) use of aspirin: Secondary | ICD-10-CM | POA: Diagnosis not present

## 2020-11-09 DIAGNOSIS — H353132 Nonexudative age-related macular degeneration, bilateral, intermediate dry stage: Secondary | ICD-10-CM | POA: Diagnosis not present

## 2020-11-09 DIAGNOSIS — M8448XD Pathological fracture, other site, subsequent encounter for fracture with routine healing: Secondary | ICD-10-CM | POA: Diagnosis not present

## 2020-11-09 DIAGNOSIS — W108XXD Fall (on) (from) other stairs and steps, subsequent encounter: Secondary | ICD-10-CM | POA: Diagnosis not present

## 2020-11-09 DIAGNOSIS — Z993 Dependence on wheelchair: Secondary | ICD-10-CM | POA: Diagnosis not present

## 2020-11-09 DIAGNOSIS — S32029D Unspecified fracture of second lumbar vertebra, subsequent encounter for fracture with routine healing: Secondary | ICD-10-CM | POA: Diagnosis not present

## 2020-11-09 DIAGNOSIS — S22031D Stable burst fracture of third thoracic vertebra, subsequent encounter for fracture with routine healing: Secondary | ICD-10-CM | POA: Diagnosis not present

## 2020-11-09 DIAGNOSIS — M19042 Primary osteoarthritis, left hand: Secondary | ICD-10-CM | POA: Diagnosis not present

## 2020-11-09 DIAGNOSIS — H02831 Dermatochalasis of right upper eyelid: Secondary | ICD-10-CM | POA: Diagnosis not present

## 2020-11-09 DIAGNOSIS — H2510 Age-related nuclear cataract, unspecified eye: Secondary | ICD-10-CM | POA: Diagnosis not present

## 2020-11-09 DIAGNOSIS — Z87891 Personal history of nicotine dependence: Secondary | ICD-10-CM | POA: Diagnosis not present

## 2020-11-09 DIAGNOSIS — S62305D Unspecified fracture of fourth metacarpal bone, left hand, subsequent encounter for fracture with routine healing: Secondary | ICD-10-CM | POA: Diagnosis not present

## 2020-11-09 DIAGNOSIS — S32019D Unspecified fracture of first lumbar vertebra, subsequent encounter for fracture with routine healing: Secondary | ICD-10-CM | POA: Diagnosis not present

## 2020-11-09 DIAGNOSIS — Z9181 History of falling: Secondary | ICD-10-CM | POA: Diagnosis not present

## 2020-11-09 DIAGNOSIS — H5203 Hypermetropia, bilateral: Secondary | ICD-10-CM | POA: Diagnosis not present

## 2020-11-09 DIAGNOSIS — M8580 Other specified disorders of bone density and structure, unspecified site: Secondary | ICD-10-CM | POA: Diagnosis not present

## 2020-11-09 DIAGNOSIS — S62307D Unspecified fracture of fifth metacarpal bone, left hand, subsequent encounter for fracture with routine healing: Secondary | ICD-10-CM | POA: Diagnosis not present

## 2020-11-10 DIAGNOSIS — Z993 Dependence on wheelchair: Secondary | ICD-10-CM | POA: Diagnosis not present

## 2020-11-10 DIAGNOSIS — S62305D Unspecified fracture of fourth metacarpal bone, left hand, subsequent encounter for fracture with routine healing: Secondary | ICD-10-CM | POA: Diagnosis not present

## 2020-11-10 DIAGNOSIS — H353132 Nonexudative age-related macular degeneration, bilateral, intermediate dry stage: Secondary | ICD-10-CM | POA: Diagnosis not present

## 2020-11-10 DIAGNOSIS — Z87891 Personal history of nicotine dependence: Secondary | ICD-10-CM | POA: Diagnosis not present

## 2020-11-10 DIAGNOSIS — Z7982 Long term (current) use of aspirin: Secondary | ICD-10-CM | POA: Diagnosis not present

## 2020-11-10 DIAGNOSIS — Z9181 History of falling: Secondary | ICD-10-CM | POA: Diagnosis not present

## 2020-11-10 DIAGNOSIS — S32019D Unspecified fracture of first lumbar vertebra, subsequent encounter for fracture with routine healing: Secondary | ICD-10-CM | POA: Diagnosis not present

## 2020-11-10 DIAGNOSIS — Z7984 Long term (current) use of oral hypoglycemic drugs: Secondary | ICD-10-CM | POA: Diagnosis not present

## 2020-11-10 DIAGNOSIS — M8448XD Pathological fracture, other site, subsequent encounter for fracture with routine healing: Secondary | ICD-10-CM | POA: Diagnosis not present

## 2020-11-10 DIAGNOSIS — H5203 Hypermetropia, bilateral: Secondary | ICD-10-CM | POA: Diagnosis not present

## 2020-11-10 DIAGNOSIS — H2510 Age-related nuclear cataract, unspecified eye: Secondary | ICD-10-CM | POA: Diagnosis not present

## 2020-11-10 DIAGNOSIS — M8580 Other specified disorders of bone density and structure, unspecified site: Secondary | ICD-10-CM | POA: Diagnosis not present

## 2020-11-10 DIAGNOSIS — E1136 Type 2 diabetes mellitus with diabetic cataract: Secondary | ICD-10-CM | POA: Diagnosis not present

## 2020-11-10 DIAGNOSIS — S32029D Unspecified fracture of second lumbar vertebra, subsequent encounter for fracture with routine healing: Secondary | ICD-10-CM | POA: Diagnosis not present

## 2020-11-10 DIAGNOSIS — S2241XD Multiple fractures of ribs, right side, subsequent encounter for fracture with routine healing: Secondary | ICD-10-CM | POA: Diagnosis not present

## 2020-11-10 DIAGNOSIS — S22031D Stable burst fracture of third thoracic vertebra, subsequent encounter for fracture with routine healing: Secondary | ICD-10-CM | POA: Diagnosis not present

## 2020-11-10 DIAGNOSIS — W108XXD Fall (on) (from) other stairs and steps, subsequent encounter: Secondary | ICD-10-CM | POA: Diagnosis not present

## 2020-11-10 DIAGNOSIS — M19042 Primary osteoarthritis, left hand: Secondary | ICD-10-CM | POA: Diagnosis not present

## 2020-11-10 DIAGNOSIS — S62307D Unspecified fracture of fifth metacarpal bone, left hand, subsequent encounter for fracture with routine healing: Secondary | ICD-10-CM | POA: Diagnosis not present

## 2020-11-10 DIAGNOSIS — E119 Type 2 diabetes mellitus without complications: Secondary | ICD-10-CM | POA: Diagnosis not present

## 2020-11-10 DIAGNOSIS — H524 Presbyopia: Secondary | ICD-10-CM | POA: Diagnosis not present

## 2020-11-10 DIAGNOSIS — H02834 Dermatochalasis of left upper eyelid: Secondary | ICD-10-CM | POA: Diagnosis not present

## 2020-11-10 DIAGNOSIS — H02831 Dermatochalasis of right upper eyelid: Secondary | ICD-10-CM | POA: Diagnosis not present

## 2020-11-10 DIAGNOSIS — H43391 Other vitreous opacities, right eye: Secondary | ICD-10-CM | POA: Diagnosis not present

## 2020-11-11 ENCOUNTER — Ambulatory Visit: Payer: Medicare Other | Admitting: Infectious Disease

## 2020-11-14 DIAGNOSIS — Z7982 Long term (current) use of aspirin: Secondary | ICD-10-CM | POA: Diagnosis not present

## 2020-11-14 DIAGNOSIS — W108XXD Fall (on) (from) other stairs and steps, subsequent encounter: Secondary | ICD-10-CM | POA: Diagnosis not present

## 2020-11-14 DIAGNOSIS — H353132 Nonexudative age-related macular degeneration, bilateral, intermediate dry stage: Secondary | ICD-10-CM | POA: Diagnosis not present

## 2020-11-14 DIAGNOSIS — M8580 Other specified disorders of bone density and structure, unspecified site: Secondary | ICD-10-CM | POA: Diagnosis not present

## 2020-11-14 DIAGNOSIS — S22031D Stable burst fracture of third thoracic vertebra, subsequent encounter for fracture with routine healing: Secondary | ICD-10-CM | POA: Diagnosis not present

## 2020-11-14 DIAGNOSIS — Z87891 Personal history of nicotine dependence: Secondary | ICD-10-CM | POA: Diagnosis not present

## 2020-11-14 DIAGNOSIS — H43391 Other vitreous opacities, right eye: Secondary | ICD-10-CM | POA: Diagnosis not present

## 2020-11-14 DIAGNOSIS — E119 Type 2 diabetes mellitus without complications: Secondary | ICD-10-CM | POA: Diagnosis not present

## 2020-11-14 DIAGNOSIS — H02834 Dermatochalasis of left upper eyelid: Secondary | ICD-10-CM | POA: Diagnosis not present

## 2020-11-14 DIAGNOSIS — H5203 Hypermetropia, bilateral: Secondary | ICD-10-CM | POA: Diagnosis not present

## 2020-11-14 DIAGNOSIS — H2510 Age-related nuclear cataract, unspecified eye: Secondary | ICD-10-CM | POA: Diagnosis not present

## 2020-11-14 DIAGNOSIS — Z993 Dependence on wheelchair: Secondary | ICD-10-CM | POA: Diagnosis not present

## 2020-11-14 DIAGNOSIS — S32029D Unspecified fracture of second lumbar vertebra, subsequent encounter for fracture with routine healing: Secondary | ICD-10-CM | POA: Diagnosis not present

## 2020-11-14 DIAGNOSIS — S62305D Unspecified fracture of fourth metacarpal bone, left hand, subsequent encounter for fracture with routine healing: Secondary | ICD-10-CM | POA: Diagnosis not present

## 2020-11-14 DIAGNOSIS — S32019D Unspecified fracture of first lumbar vertebra, subsequent encounter for fracture with routine healing: Secondary | ICD-10-CM | POA: Diagnosis not present

## 2020-11-14 DIAGNOSIS — Z9181 History of falling: Secondary | ICD-10-CM | POA: Diagnosis not present

## 2020-11-14 DIAGNOSIS — Z7984 Long term (current) use of oral hypoglycemic drugs: Secondary | ICD-10-CM | POA: Diagnosis not present

## 2020-11-14 DIAGNOSIS — H524 Presbyopia: Secondary | ICD-10-CM | POA: Diagnosis not present

## 2020-11-14 DIAGNOSIS — E1136 Type 2 diabetes mellitus with diabetic cataract: Secondary | ICD-10-CM | POA: Diagnosis not present

## 2020-11-14 DIAGNOSIS — S2241XD Multiple fractures of ribs, right side, subsequent encounter for fracture with routine healing: Secondary | ICD-10-CM | POA: Diagnosis not present

## 2020-11-14 DIAGNOSIS — M8448XD Pathological fracture, other site, subsequent encounter for fracture with routine healing: Secondary | ICD-10-CM | POA: Diagnosis not present

## 2020-11-14 DIAGNOSIS — S62307D Unspecified fracture of fifth metacarpal bone, left hand, subsequent encounter for fracture with routine healing: Secondary | ICD-10-CM | POA: Diagnosis not present

## 2020-11-14 DIAGNOSIS — H02831 Dermatochalasis of right upper eyelid: Secondary | ICD-10-CM | POA: Diagnosis not present

## 2020-11-14 DIAGNOSIS — M19042 Primary osteoarthritis, left hand: Secondary | ICD-10-CM | POA: Diagnosis not present

## 2020-11-16 ENCOUNTER — Ambulatory Visit: Payer: Medicare Other | Admitting: Infectious Disease

## 2020-11-17 DIAGNOSIS — M8448XD Pathological fracture, other site, subsequent encounter for fracture with routine healing: Secondary | ICD-10-CM | POA: Diagnosis not present

## 2020-11-17 DIAGNOSIS — E119 Type 2 diabetes mellitus without complications: Secondary | ICD-10-CM | POA: Diagnosis not present

## 2020-11-17 DIAGNOSIS — H524 Presbyopia: Secondary | ICD-10-CM | POA: Diagnosis not present

## 2020-11-17 DIAGNOSIS — H5203 Hypermetropia, bilateral: Secondary | ICD-10-CM | POA: Diagnosis not present

## 2020-11-17 DIAGNOSIS — Z9181 History of falling: Secondary | ICD-10-CM | POA: Diagnosis not present

## 2020-11-17 DIAGNOSIS — Z7982 Long term (current) use of aspirin: Secondary | ICD-10-CM | POA: Diagnosis not present

## 2020-11-17 DIAGNOSIS — S62307D Unspecified fracture of fifth metacarpal bone, left hand, subsequent encounter for fracture with routine healing: Secondary | ICD-10-CM | POA: Diagnosis not present

## 2020-11-17 DIAGNOSIS — M8580 Other specified disorders of bone density and structure, unspecified site: Secondary | ICD-10-CM | POA: Diagnosis not present

## 2020-11-17 DIAGNOSIS — W108XXD Fall (on) (from) other stairs and steps, subsequent encounter: Secondary | ICD-10-CM | POA: Diagnosis not present

## 2020-11-17 DIAGNOSIS — H43391 Other vitreous opacities, right eye: Secondary | ICD-10-CM | POA: Diagnosis not present

## 2020-11-17 DIAGNOSIS — S62305D Unspecified fracture of fourth metacarpal bone, left hand, subsequent encounter for fracture with routine healing: Secondary | ICD-10-CM | POA: Diagnosis not present

## 2020-11-17 DIAGNOSIS — M19042 Primary osteoarthritis, left hand: Secondary | ICD-10-CM | POA: Diagnosis not present

## 2020-11-17 DIAGNOSIS — H353132 Nonexudative age-related macular degeneration, bilateral, intermediate dry stage: Secondary | ICD-10-CM | POA: Diagnosis not present

## 2020-11-17 DIAGNOSIS — H02834 Dermatochalasis of left upper eyelid: Secondary | ICD-10-CM | POA: Diagnosis not present

## 2020-11-17 DIAGNOSIS — Z993 Dependence on wheelchair: Secondary | ICD-10-CM | POA: Diagnosis not present

## 2020-11-17 DIAGNOSIS — H2510 Age-related nuclear cataract, unspecified eye: Secondary | ICD-10-CM | POA: Diagnosis not present

## 2020-11-17 DIAGNOSIS — E1136 Type 2 diabetes mellitus with diabetic cataract: Secondary | ICD-10-CM | POA: Diagnosis not present

## 2020-11-17 DIAGNOSIS — S2241XD Multiple fractures of ribs, right side, subsequent encounter for fracture with routine healing: Secondary | ICD-10-CM | POA: Diagnosis not present

## 2020-11-17 DIAGNOSIS — S32019D Unspecified fracture of first lumbar vertebra, subsequent encounter for fracture with routine healing: Secondary | ICD-10-CM | POA: Diagnosis not present

## 2020-11-17 DIAGNOSIS — S22031D Stable burst fracture of third thoracic vertebra, subsequent encounter for fracture with routine healing: Secondary | ICD-10-CM | POA: Diagnosis not present

## 2020-11-17 DIAGNOSIS — S32029D Unspecified fracture of second lumbar vertebra, subsequent encounter for fracture with routine healing: Secondary | ICD-10-CM | POA: Diagnosis not present

## 2020-11-17 DIAGNOSIS — H02831 Dermatochalasis of right upper eyelid: Secondary | ICD-10-CM | POA: Diagnosis not present

## 2020-11-17 DIAGNOSIS — Z87891 Personal history of nicotine dependence: Secondary | ICD-10-CM | POA: Diagnosis not present

## 2020-11-17 DIAGNOSIS — Z7984 Long term (current) use of oral hypoglycemic drugs: Secondary | ICD-10-CM | POA: Diagnosis not present

## 2020-11-18 ENCOUNTER — Encounter: Payer: Self-pay | Admitting: Family Medicine

## 2020-11-18 DIAGNOSIS — Z993 Dependence on wheelchair: Secondary | ICD-10-CM | POA: Diagnosis not present

## 2020-11-18 DIAGNOSIS — H353132 Nonexudative age-related macular degeneration, bilateral, intermediate dry stage: Secondary | ICD-10-CM | POA: Diagnosis not present

## 2020-11-18 DIAGNOSIS — S32029D Unspecified fracture of second lumbar vertebra, subsequent encounter for fracture with routine healing: Secondary | ICD-10-CM | POA: Diagnosis not present

## 2020-11-18 DIAGNOSIS — H524 Presbyopia: Secondary | ICD-10-CM | POA: Diagnosis not present

## 2020-11-18 DIAGNOSIS — H43391 Other vitreous opacities, right eye: Secondary | ICD-10-CM | POA: Diagnosis not present

## 2020-11-18 DIAGNOSIS — Z87891 Personal history of nicotine dependence: Secondary | ICD-10-CM | POA: Diagnosis not present

## 2020-11-18 DIAGNOSIS — W108XXD Fall (on) (from) other stairs and steps, subsequent encounter: Secondary | ICD-10-CM | POA: Diagnosis not present

## 2020-11-18 DIAGNOSIS — S2241XD Multiple fractures of ribs, right side, subsequent encounter for fracture with routine healing: Secondary | ICD-10-CM | POA: Diagnosis not present

## 2020-11-18 DIAGNOSIS — E119 Type 2 diabetes mellitus without complications: Secondary | ICD-10-CM | POA: Diagnosis not present

## 2020-11-18 DIAGNOSIS — S62307D Unspecified fracture of fifth metacarpal bone, left hand, subsequent encounter for fracture with routine healing: Secondary | ICD-10-CM | POA: Diagnosis not present

## 2020-11-18 DIAGNOSIS — S62305D Unspecified fracture of fourth metacarpal bone, left hand, subsequent encounter for fracture with routine healing: Secondary | ICD-10-CM | POA: Diagnosis not present

## 2020-11-18 DIAGNOSIS — Z7982 Long term (current) use of aspirin: Secondary | ICD-10-CM | POA: Diagnosis not present

## 2020-11-18 DIAGNOSIS — H5203 Hypermetropia, bilateral: Secondary | ICD-10-CM | POA: Diagnosis not present

## 2020-11-18 DIAGNOSIS — H2510 Age-related nuclear cataract, unspecified eye: Secondary | ICD-10-CM | POA: Diagnosis not present

## 2020-11-18 DIAGNOSIS — H02834 Dermatochalasis of left upper eyelid: Secondary | ICD-10-CM | POA: Diagnosis not present

## 2020-11-18 DIAGNOSIS — M8448XD Pathological fracture, other site, subsequent encounter for fracture with routine healing: Secondary | ICD-10-CM | POA: Diagnosis not present

## 2020-11-18 DIAGNOSIS — M19042 Primary osteoarthritis, left hand: Secondary | ICD-10-CM | POA: Diagnosis not present

## 2020-11-18 DIAGNOSIS — S22031D Stable burst fracture of third thoracic vertebra, subsequent encounter for fracture with routine healing: Secondary | ICD-10-CM | POA: Diagnosis not present

## 2020-11-18 DIAGNOSIS — E1136 Type 2 diabetes mellitus with diabetic cataract: Secondary | ICD-10-CM | POA: Diagnosis not present

## 2020-11-18 DIAGNOSIS — Z9181 History of falling: Secondary | ICD-10-CM | POA: Diagnosis not present

## 2020-11-18 DIAGNOSIS — H02831 Dermatochalasis of right upper eyelid: Secondary | ICD-10-CM | POA: Diagnosis not present

## 2020-11-18 DIAGNOSIS — S32019D Unspecified fracture of first lumbar vertebra, subsequent encounter for fracture with routine healing: Secondary | ICD-10-CM | POA: Diagnosis not present

## 2020-11-18 DIAGNOSIS — M8580 Other specified disorders of bone density and structure, unspecified site: Secondary | ICD-10-CM | POA: Diagnosis not present

## 2020-11-18 DIAGNOSIS — Z7984 Long term (current) use of oral hypoglycemic drugs: Secondary | ICD-10-CM | POA: Diagnosis not present

## 2020-11-20 ENCOUNTER — Other Ambulatory Visit: Payer: Self-pay | Admitting: Family Medicine

## 2020-11-20 MED ORDER — METFORMIN HCL 500 MG PO TABS: ORAL_TABLET | ORAL | Status: DC

## 2020-11-21 DIAGNOSIS — E1136 Type 2 diabetes mellitus with diabetic cataract: Secondary | ICD-10-CM | POA: Diagnosis not present

## 2020-11-21 DIAGNOSIS — S32029D Unspecified fracture of second lumbar vertebra, subsequent encounter for fracture with routine healing: Secondary | ICD-10-CM | POA: Diagnosis not present

## 2020-11-21 DIAGNOSIS — E119 Type 2 diabetes mellitus without complications: Secondary | ICD-10-CM | POA: Diagnosis not present

## 2020-11-21 DIAGNOSIS — S62305D Unspecified fracture of fourth metacarpal bone, left hand, subsequent encounter for fracture with routine healing: Secondary | ICD-10-CM | POA: Diagnosis not present

## 2020-11-21 DIAGNOSIS — H524 Presbyopia: Secondary | ICD-10-CM | POA: Diagnosis not present

## 2020-11-21 DIAGNOSIS — H353132 Nonexudative age-related macular degeneration, bilateral, intermediate dry stage: Secondary | ICD-10-CM | POA: Diagnosis not present

## 2020-11-21 DIAGNOSIS — S2241XD Multiple fractures of ribs, right side, subsequent encounter for fracture with routine healing: Secondary | ICD-10-CM | POA: Diagnosis not present

## 2020-11-21 DIAGNOSIS — Z9181 History of falling: Secondary | ICD-10-CM | POA: Diagnosis not present

## 2020-11-21 DIAGNOSIS — S32019D Unspecified fracture of first lumbar vertebra, subsequent encounter for fracture with routine healing: Secondary | ICD-10-CM | POA: Diagnosis not present

## 2020-11-21 DIAGNOSIS — Z993 Dependence on wheelchair: Secondary | ICD-10-CM | POA: Diagnosis not present

## 2020-11-21 DIAGNOSIS — Z7982 Long term (current) use of aspirin: Secondary | ICD-10-CM | POA: Diagnosis not present

## 2020-11-21 DIAGNOSIS — H2510 Age-related nuclear cataract, unspecified eye: Secondary | ICD-10-CM | POA: Diagnosis not present

## 2020-11-21 DIAGNOSIS — M8448XD Pathological fracture, other site, subsequent encounter for fracture with routine healing: Secondary | ICD-10-CM | POA: Diagnosis not present

## 2020-11-21 DIAGNOSIS — H5203 Hypermetropia, bilateral: Secondary | ICD-10-CM | POA: Diagnosis not present

## 2020-11-21 DIAGNOSIS — S22031D Stable burst fracture of third thoracic vertebra, subsequent encounter for fracture with routine healing: Secondary | ICD-10-CM | POA: Diagnosis not present

## 2020-11-21 DIAGNOSIS — M8580 Other specified disorders of bone density and structure, unspecified site: Secondary | ICD-10-CM | POA: Diagnosis not present

## 2020-11-21 DIAGNOSIS — Z7984 Long term (current) use of oral hypoglycemic drugs: Secondary | ICD-10-CM | POA: Diagnosis not present

## 2020-11-21 DIAGNOSIS — H02834 Dermatochalasis of left upper eyelid: Secondary | ICD-10-CM | POA: Diagnosis not present

## 2020-11-21 DIAGNOSIS — W108XXD Fall (on) (from) other stairs and steps, subsequent encounter: Secondary | ICD-10-CM | POA: Diagnosis not present

## 2020-11-21 DIAGNOSIS — M19042 Primary osteoarthritis, left hand: Secondary | ICD-10-CM | POA: Diagnosis not present

## 2020-11-21 DIAGNOSIS — H02831 Dermatochalasis of right upper eyelid: Secondary | ICD-10-CM | POA: Diagnosis not present

## 2020-11-21 DIAGNOSIS — Z87891 Personal history of nicotine dependence: Secondary | ICD-10-CM | POA: Diagnosis not present

## 2020-11-21 DIAGNOSIS — S62307D Unspecified fracture of fifth metacarpal bone, left hand, subsequent encounter for fracture with routine healing: Secondary | ICD-10-CM | POA: Diagnosis not present

## 2020-11-21 DIAGNOSIS — H43391 Other vitreous opacities, right eye: Secondary | ICD-10-CM | POA: Diagnosis not present

## 2020-11-25 ENCOUNTER — Other Ambulatory Visit: Payer: Self-pay

## 2020-11-25 ENCOUNTER — Ambulatory Visit (INDEPENDENT_AMBULATORY_CARE_PROVIDER_SITE_OTHER): Payer: Medicare Other

## 2020-11-25 ENCOUNTER — Ambulatory Visit (INDEPENDENT_AMBULATORY_CARE_PROVIDER_SITE_OTHER): Payer: Medicare Other | Admitting: Infectious Disease

## 2020-11-25 ENCOUNTER — Ambulatory Visit: Payer: Medicare Other

## 2020-11-25 VITALS — BP 136/84 | HR 83 | Resp 16 | Ht 70.0 in | Wt 178.0 lb

## 2020-11-25 DIAGNOSIS — S62308A Unspecified fracture of other metacarpal bone, initial encounter for closed fracture: Secondary | ICD-10-CM

## 2020-11-25 DIAGNOSIS — Z79899 Other long term (current) drug therapy: Secondary | ICD-10-CM

## 2020-11-25 DIAGNOSIS — Z23 Encounter for immunization: Secondary | ICD-10-CM

## 2020-11-25 DIAGNOSIS — S22030D Wedge compression fracture of third thoracic vertebra, subsequent encounter for fracture with routine healing: Secondary | ICD-10-CM | POA: Diagnosis not present

## 2020-11-25 DIAGNOSIS — B181 Chronic viral hepatitis B without delta-agent: Secondary | ICD-10-CM
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
## 2020-11-28 ENCOUNTER — Other Ambulatory Visit: Payer: Self-pay

## 2020-11-28 DIAGNOSIS — Z993 Dependence on wheelchair: Secondary | ICD-10-CM | POA: Diagnosis not present

## 2020-11-28 DIAGNOSIS — M19042 Primary osteoarthritis, left hand: Secondary | ICD-10-CM | POA: Diagnosis not present

## 2020-11-28 DIAGNOSIS — M8448XD Pathological fracture, other site, subsequent encounter for fracture with routine healing: Secondary | ICD-10-CM | POA: Diagnosis not present

## 2020-11-28 DIAGNOSIS — H43391 Other vitreous opacities, right eye: Secondary | ICD-10-CM | POA: Diagnosis not present

## 2020-11-28 DIAGNOSIS — S22031D Stable burst fracture of third thoracic vertebra, subsequent encounter for fracture with routine healing: Secondary | ICD-10-CM | POA: Diagnosis not present

## 2020-11-28 DIAGNOSIS — H02831 Dermatochalasis of right upper eyelid: Secondary | ICD-10-CM | POA: Diagnosis not present

## 2020-11-28 DIAGNOSIS — W108XXD Fall (on) (from) other stairs and steps, subsequent encounter: Secondary | ICD-10-CM | POA: Diagnosis not present

## 2020-11-28 DIAGNOSIS — H353132 Nonexudative age-related macular degeneration, bilateral, intermediate dry stage: Secondary | ICD-10-CM | POA: Diagnosis not present

## 2020-11-28 DIAGNOSIS — Z7984 Long term (current) use of oral hypoglycemic drugs: Secondary | ICD-10-CM | POA: Diagnosis not present

## 2020-11-28 DIAGNOSIS — H02834 Dermatochalasis of left upper eyelid: Secondary | ICD-10-CM | POA: Diagnosis not present

## 2020-11-28 DIAGNOSIS — S62307D Unspecified fracture of fifth metacarpal bone, left hand, subsequent encounter for fracture with routine healing: Secondary | ICD-10-CM | POA: Diagnosis not present

## 2020-11-28 DIAGNOSIS — S32029D Unspecified fracture of second lumbar vertebra, subsequent encounter for fracture with routine healing: Secondary | ICD-10-CM | POA: Diagnosis not present

## 2020-11-28 DIAGNOSIS — H2510 Age-related nuclear cataract, unspecified eye: Secondary | ICD-10-CM | POA: Diagnosis not present

## 2020-11-28 DIAGNOSIS — H524 Presbyopia: Secondary | ICD-10-CM | POA: Diagnosis not present

## 2020-11-28 DIAGNOSIS — E1136 Type 2 diabetes mellitus with diabetic cataract: Secondary | ICD-10-CM | POA: Diagnosis not present

## 2020-11-28 DIAGNOSIS — S62305D Unspecified fracture of fourth metacarpal bone, left hand, subsequent encounter for fracture with routine healing: Secondary | ICD-10-CM | POA: Diagnosis not present

## 2020-11-28 DIAGNOSIS — Z7982 Long term (current) use of aspirin: Secondary | ICD-10-CM | POA: Diagnosis not present

## 2020-11-28 DIAGNOSIS — E119 Type 2 diabetes mellitus without complications: Secondary | ICD-10-CM | POA: Diagnosis not present

## 2020-11-28 DIAGNOSIS — Z87891 Personal history of nicotine dependence: Secondary | ICD-10-CM | POA: Diagnosis not present

## 2020-11-28 DIAGNOSIS — M8580 Other specified disorders of bone density and structure, unspecified site: Secondary | ICD-10-CM | POA: Diagnosis not present

## 2020-11-28 DIAGNOSIS — H5203 Hypermetropia, bilateral: Secondary | ICD-10-CM | POA: Diagnosis not present

## 2020-11-28 DIAGNOSIS — S32019D Unspecified fracture of first lumbar vertebra, subsequent encounter for fracture with routine healing: Secondary | ICD-10-CM | POA: Diagnosis not present

## 2020-11-28 DIAGNOSIS — Z9181 History of falling: Secondary | ICD-10-CM | POA: Diagnosis not present

## 2020-11-28 DIAGNOSIS — S2241XD Multiple fractures of ribs, right side, subsequent encounter for fracture with routine healing: Secondary | ICD-10-CM | POA: Diagnosis not present

## 2020-11-28 LAB — COMPLETE METABOLIC PANEL WITH GFR
AG Ratio: 2 (calc) (ref 1.0–2.5)
ALT: 27 U/L (ref 9–46)
AST: 20 U/L (ref 10–35)
Albumin: 4 g/dL (ref 3.6–5.1)
Alkaline phosphatase (APISO): 61 U/L (ref 35–144)
BUN: 22 mg/dL (ref 7–25)
CO2: 23 mmol/L (ref 20–32)
Calcium: 9.9 mg/dL (ref 8.6–10.3)
Chloride: 105 mmol/L (ref 98–110)
Creat: 0.97 mg/dL (ref 0.70–1.28)
Globulin: 2 g/dL (calc) (ref 1.9–3.7)
Glucose, Bld: 130 mg/dL — ABNORMAL HIGH (ref 65–99)
Potassium: 4.2 mmol/L (ref 3.5–5.3)
Sodium: 138 mmol/L (ref 135–146)
Total Bilirubin: 0.5 mg/dL (ref 0.2–1.2)
Total Protein: 6 g/dL — ABNORMAL LOW (ref 6.1–8.1)
eGFR: 80 mL/min/{1.73_m2} (ref >=60)

## 2020-11-28 LAB — RPR: RPR Ser Ql: NONREACTIVE

## 2020-11-28 LAB — HIV ANTIBODY (ROUTINE TESTING W REFLEX): HIV 1&2 Ab, 4th Generation: NONREACTIVE

## 2020-11-28 LAB — HEPATITIS B E ANTIGEN: Hep B E Ag: REACTIVE — AB

## 2020-11-28 LAB — HEPATITIS B E ANTIBODY: Hep B E Ab: NONREACTIVE

## 2020-11-28 NOTE — Progress Notes (Signed)
    [facility]  Name:  [name]    MRN: [id number] DOB: 06-18-1940   11/28/2020  Mr. Zahradnik was observed post JYNNEOS immunization for 15 minutes without incident. He was provided with Vaccine Information Sheet and instruction to access the V-Safe system.   Mr. Wellbrock was instructed to call 911 with any severe reactions post vaccine: Difficulty breathing  Swelling of face and throat  A fast heartbeat  A bad rash all over body  Dizziness and weakness

## 2020-11-30 ENCOUNTER — Other Ambulatory Visit: Payer: Self-pay | Admitting: Infectious Disease

## 2020-11-30 MED ORDER — DESCOVY 200-25 MG PO TABS: 1.0000 | ORAL_TABLET | Freq: Every day | ORAL | 2 refills | Status: DC

## 2020-12-14 DIAGNOSIS — E1136 Type 2 diabetes mellitus with diabetic cataract: Secondary | ICD-10-CM | POA: Diagnosis not present

## 2020-12-14 DIAGNOSIS — Z9181 History of falling: Secondary | ICD-10-CM | POA: Diagnosis not present

## 2020-12-14 DIAGNOSIS — M899 Disorder of bone, unspecified: Secondary | ICD-10-CM | POA: Diagnosis not present

## 2020-12-14 DIAGNOSIS — M949 Disorder of cartilage, unspecified: Secondary | ICD-10-CM | POA: Diagnosis not present

## 2020-12-14 DIAGNOSIS — Z8781 Personal history of (healed) traumatic fracture: Secondary | ICD-10-CM | POA: Diagnosis not present

## 2020-12-14 DIAGNOSIS — Z87891 Personal history of nicotine dependence: Secondary | ICD-10-CM | POA: Diagnosis not present

## 2020-12-14 DIAGNOSIS — E559 Vitamin D deficiency, unspecified: Secondary | ICD-10-CM | POA: Diagnosis not present

## 2020-12-14 DIAGNOSIS — E119 Type 2 diabetes mellitus without complications: Secondary | ICD-10-CM | POA: Diagnosis not present

## 2020-12-16 DIAGNOSIS — H353132 Nonexudative age-related macular degeneration, bilateral, intermediate dry stage: Secondary | ICD-10-CM | POA: Diagnosis not present

## 2020-12-16 DIAGNOSIS — H16223 Keratoconjunctivitis sicca, not specified as Sjogren's, bilateral: Secondary | ICD-10-CM | POA: Diagnosis not present

## 2020-12-16 DIAGNOSIS — Z83511 Family history of glaucoma: Secondary | ICD-10-CM | POA: Diagnosis not present

## 2020-12-16 DIAGNOSIS — H02834 Dermatochalasis of left upper eyelid: Secondary | ICD-10-CM | POA: Diagnosis not present

## 2020-12-16 DIAGNOSIS — H43391 Other vitreous opacities, right eye: Secondary | ICD-10-CM | POA: Diagnosis not present

## 2020-12-16 DIAGNOSIS — H3589 Other specified retinal disorders: Secondary | ICD-10-CM | POA: Diagnosis not present

## 2020-12-16 DIAGNOSIS — H524 Presbyopia: Secondary | ICD-10-CM | POA: Diagnosis not present

## 2020-12-16 DIAGNOSIS — H02831 Dermatochalasis of right upper eyelid: Secondary | ICD-10-CM | POA: Diagnosis not present

## 2020-12-16 DIAGNOSIS — H2513 Age-related nuclear cataract, bilateral: Secondary | ICD-10-CM | POA: Diagnosis not present

## 2020-12-16 DIAGNOSIS — Z7984 Long term (current) use of oral hypoglycemic drugs: Secondary | ICD-10-CM | POA: Diagnosis not present

## 2020-12-16 DIAGNOSIS — E119 Type 2 diabetes mellitus without complications: Secondary | ICD-10-CM | POA: Diagnosis not present

## 2020-12-16 DIAGNOSIS — H5203 Hypermetropia, bilateral: Secondary | ICD-10-CM | POA: Diagnosis not present

## 2020-12-26 DIAGNOSIS — S22030D Wedge compression fracture of third thoracic vertebra, subsequent encounter for fracture with routine healing: Secondary | ICD-10-CM | POA: Diagnosis not present

## 2021-01-02 ENCOUNTER — Encounter: Payer: Self-pay | Admitting: Family Medicine

## 2021-01-03 ENCOUNTER — Other Ambulatory Visit: Payer: Self-pay | Admitting: Family Medicine

## 2021-01-05 ENCOUNTER — Other Ambulatory Visit: Payer: Self-pay | Admitting: Infectious Disease

## 2021-01-05 ENCOUNTER — Telehealth: Payer: Self-pay

## 2021-01-05 DIAGNOSIS — N401 Enlarged prostate with lower urinary tract symptoms: Secondary | ICD-10-CM

## 2021-01-05 MED ORDER — DOXAZOSIN MESYLATE 8 MG PO TABS: 8.0000 mg | ORAL_TABLET | Freq: Every day | ORAL | 0 refills | Status: DC

## 2021-01-05 MED ORDER — DOXAZOSIN MESYLATE 4 MG PO TABS: 4.0000 mg | ORAL_TABLET | Freq: Every day | ORAL | 1 refills | Status: DC

## 2021-01-05 NOTE — Telephone Encounter (Signed)
Pt returned call. He actually has been taking full 8 mg dose Cardura and would like to continue this dose. He has difficulty urinating without it. He denies any dizziness or imbalance with full dose. Will update rx.

## 2021-01-05 NOTE — Addendum Note (Signed)
Addended by: Phil Dopp on: 01/05/2021 11:05 AM   Modules accepted: Orders

## 2021-01-05 NOTE — Telephone Encounter (Addendum)
Pt left voicemail requesting to renew his Cardura to [facility]. He states he left a message on website. I do not see any message. Will refill Cardura for patient. Pt was taking 1/2 of 8 mg tablet. I have changed to 4 mg 1 tablet daily so he does not have to split the pill.   Phil Dopp, PharmD Clinical Pharmacist Practitioner [facility] at [facility] [place] [phone]

## 2021-01-12 DIAGNOSIS — M810 Age-related osteoporosis without current pathological fracture: Secondary | ICD-10-CM | POA: Diagnosis not present

## 2021-01-19 DIAGNOSIS — M810 Age-related osteoporosis without current pathological fracture: Secondary | ICD-10-CM | POA: Diagnosis not present

## 2021-02-03 ENCOUNTER — Other Ambulatory Visit: Payer: Self-pay | Admitting: Infectious Disease

## 2021-02-10 ENCOUNTER — Other Ambulatory Visit: Payer: Self-pay | Admitting: Family Medicine

## 2021-02-13 ENCOUNTER — Encounter: Payer: Self-pay | Admitting: Family Medicine

## 2021-02-14 ENCOUNTER — Ambulatory Visit (INDEPENDENT_AMBULATORY_CARE_PROVIDER_SITE_OTHER): Payer: Medicare Other | Admitting: Family Medicine

## 2021-02-14 ENCOUNTER — Other Ambulatory Visit: Payer: Self-pay

## 2021-02-14 ENCOUNTER — Encounter: Payer: Self-pay | Admitting: Family Medicine

## 2021-02-14 VITALS — BP 122/74 | HR 64 | Temp 98.1°F | Ht 70.0 in | Wt 177.0 lb

## 2021-02-14 DIAGNOSIS — E119 Type 2 diabetes mellitus without complications: Secondary | ICD-10-CM

## 2021-02-14 DIAGNOSIS — M810 Age-related osteoporosis without current pathological fracture: Secondary | ICD-10-CM

## 2021-02-14 DIAGNOSIS — N401 Enlarged prostate with lower urinary tract symptoms: Secondary | ICD-10-CM | POA: Diagnosis not present

## 2021-02-14 DIAGNOSIS — E559 Vitamin D deficiency, unspecified: Secondary | ICD-10-CM | POA: Diagnosis not present

## 2021-02-14 DIAGNOSIS — Z860100 Personal history of colon polyps, unspecified: Secondary | ICD-10-CM | POA: Insufficient documentation

## 2021-02-14 LAB — POCT GLYCOSYLATED HEMOGLOBIN (HGB A1C): Hemoglobin A1C: 6.3 % — AB (ref 4.0–5.6)

## 2021-02-14 MED ORDER — ONETOUCH ULTRA VI STRP: ORAL_STRIP | 3 refills | Status: DC

## 2021-02-14 MED ORDER — SIMVASTATIN 40 MG PO TABS: 40.0000 mg | ORAL_TABLET | Freq: Every day | ORAL | 3 refills | Status: DC

## 2021-02-14 MED ORDER — DOXAZOSIN MESYLATE 8 MG PO TABS: 8.0000 mg | ORAL_TABLET | Freq: Every day | ORAL | 3 refills | Status: DC

## 2021-02-14 MED ORDER — METFORMIN HCL 500 MG PO TABS: ORAL_TABLET | ORAL | 3 refills | Status: DC

## 2021-02-14 MED ORDER — PIOGLITAZONE HCL 30 MG PO TABS: 15.0000 mg | ORAL_TABLET | Freq: Every day | ORAL | 3 refills | Status: DC

## 2021-02-14 MED ORDER — VITAMIN D (ERGOCALCIFEROL) 1.25 MG (50000 UNIT) PO CAPS: 50000.0000 [IU] | ORAL_CAPSULE | ORAL | 0 refills | Status: DC

## 2021-02-14 NOTE — Progress Notes (Signed)
This visit occurred during the SARS-CoV-2 public health emergency.  Safety protocols were in place, including screening questions prior to the visit, additional usage of staff PPE, and extensive cleaning of exam room while observing appropriate contact time as indicated for disinfecting solutions.  Diabetes:  Using medications without difficulties: yes Hypoglycemic episodes: no Hyperglycemic episodes: no Feet problems: no Blood Sugars averaging: 130-150s eye exam within last year: d/w pt about follow up.   A1c d/w pt at OV.  6.3.    D/w pt osteoporosis.  D/w pt about options (weightbearing exercise, vitamin D supplement, prescription medication options).  Prev vit D low.  Reasonable to replace vit D and recheck labs in about 3 months.  We can consider options at that point.  Pain is controlled.  Walking with a cane.  Walking 2K per day.  No syncope since episodes of likely micturition syncope.  No SZ hx.  With prev syncope, he was pos for covid at the time and rushing to bathroom.  Not lightheaded on standing.    Meds, vitals, and allergies reviewed.   ROS: Per HPI unless specifically indicated in ROS section   GEN: nad, alert and oriented HEENT: ncat NECK: supple w/o LA CV: rrr. PULM: ctab, no inc wob ABD: soft, +bs EXT: no edema SKIN: no acute rash

## 2021-02-14 NOTE — Patient Instructions (Addendum)
Take 50,000 unit vit D  weekly.  Please ask the front about a lab visit at [facility] in 3 months to recheck your vitamin D level.  Nonfasting lab visit.  Take care.  Glad to see you.  Plan on recheck at a physical/yearly visit in about 6 months.

## 2021-02-15 DIAGNOSIS — M810 Age-related osteoporosis without current pathological fracture: Secondary | ICD-10-CM | POA: Insufficient documentation

## 2021-02-15 NOTE — Assessment & Plan Note (Signed)
D/w pt osteoporosis.  D/w pt about options.  Prev vit D low.  Reasonable to replace vit D and recheck labs in about 3 months.  We can consider options at that point.  See avs.

## 2021-02-15 NOTE — Assessment & Plan Note (Signed)
A1c controlled.  Continue Actos and metformin.  Continue work on diet and exercise.  Recheck periodically.  See after visit summary.  He agrees with plan.

## 2021-03-01 ENCOUNTER — Ambulatory Visit: Payer: Medicare Other | Admitting: Infectious Disease

## 2021-03-01 ENCOUNTER — Telehealth: Payer: Self-pay

## 2021-03-01 ENCOUNTER — Other Ambulatory Visit (HOSPITAL_COMMUNITY): Payer: Self-pay

## 2021-03-01 ENCOUNTER — Other Ambulatory Visit (HOSPITAL_COMMUNITY)
Admission: RE | Admit: 2021-03-01 | Discharge: 2021-03-01 | Disposition: A | Discharge status: Home / Self Care | Payer: Medicare Other | Source: Ambulatory Visit | Attending: Infectious Disease | Admitting: Infectious Disease

## 2021-03-01 ENCOUNTER — Encounter: Payer: Self-pay | Admitting: Infectious Disease

## 2021-03-01 ENCOUNTER — Other Ambulatory Visit: Payer: Self-pay

## 2021-03-01 VITALS — BP 151/88 | HR 74 | Temp 97.5°F | Wt 182.0 lb

## 2021-03-01 DIAGNOSIS — B181 Chronic viral hepatitis B without delta-agent: Secondary | ICD-10-CM | POA: Diagnosis not present

## 2021-03-01 DIAGNOSIS — R55 Syncope and collapse: Secondary | ICD-10-CM

## 2021-03-01 DIAGNOSIS — Z79899 Other long term (current) drug therapy: Secondary | ICD-10-CM

## 2021-03-01 DIAGNOSIS — Z114 Encounter for screening for human immunodeficiency virus [HIV]: Secondary | ICD-10-CM

## 2021-03-01 DIAGNOSIS — S22030D Wedge compression fracture of third thoracic vertebra, subsequent encounter for fracture with routine healing: Secondary | ICD-10-CM

## 2021-03-01 MED ORDER — DESCOVY 200-25 MG PO TABS
1.0000 | ORAL_TABLET | Freq: Every day | ORAL | 2 refills | Status: DC
  Filled 2021-03-01 – 2021-03-10 (×2): qty 30, 30d supply, fill #0
  Filled 2021-04-14: qty 30, 30d supply, fill #1
  Filled 2021-05-10: qty 30, 30d supply, fill #2
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
## 2021-03-01 NOTE — Telephone Encounter (Signed)
[facility] Patient Advocate Encounter °  °I was successful in securing patient a $ 7500.00 grant from [employer] to provide copayment coverage for Descovy.  The patient's out of pocket cost will be $5.00 monthly.   °  °I have spoken with the patient.   ° °The billing information is as follows and has been shared with [facility].  ° ° ° ° ° ° °Dates of Eligibility: 03/01/21 through 02/04/22 ° °Patient knows to call the office with questions or concerns. ° °Sruti Ayllon, CPhT °Specialty Pharmacy Patient Advocate °[facility] °Phone: [phone] °Fax:  [phone]  °

## 2021-03-03 LAB — CYTOLOGY, (ORAL, ANAL, URETHRAL) ANCILLARY ONLY
Chlamydia: NEGATIVE
Comment: NEGATIVE
Comment: NORMAL
Neisseria Gonorrhea: NEGATIVE

## 2021-03-04 LAB — HIV-1 RNA QUANT-NO REFLEX-BLD
HIV 1 RNA Quant: NOT DETECTED Copies/mL
HIV-1 RNA Quant, Log: NOT DETECTED Log cps/mL

## 2021-03-04 LAB — COMPLETE METABOLIC PANEL WITH GFR
AG Ratio: 2 (calc) (ref 1.0–2.5)
ALT: 42 U/L (ref 9–46)
AST: 27 U/L (ref 10–35)
Albumin: 4.3 g/dL (ref 3.6–5.1)
Alkaline phosphatase (APISO): 47 U/L (ref 35–144)
BUN/Creatinine Ratio: 34 (calc) — ABNORMAL HIGH (ref 6–22)
BUN: 27 mg/dL — ABNORMAL HIGH (ref 7–25)
CO2: 28 mmol/L (ref 20–32)
Calcium: 9.8 mg/dL (ref 8.6–10.3)
Chloride: 104 mmol/L (ref 98–110)
Creat: 0.8 mg/dL (ref 0.70–1.28)
Globulin: 2.1 g/dL (calc) (ref 1.9–3.7)
Glucose, Bld: 205 mg/dL — ABNORMAL HIGH (ref 65–99)
Potassium: 3.9 mmol/L (ref 3.5–5.3)
Sodium: 140 mmol/L (ref 135–146)
Total Bilirubin: 0.4 mg/dL (ref 0.2–1.2)
Total Protein: 6.4 g/dL (ref 6.1–8.1)
eGFR: 90 mL/min/{1.73_m2} (ref >=60)

## 2021-03-04 LAB — HIV ANTIBODY (ROUTINE TESTING W REFLEX): HIV 1&2 Ab, 4th Generation: NONREACTIVE

## 2021-03-04 LAB — RPR: RPR Ser Ql: NONREACTIVE

## 2021-03-06 ENCOUNTER — Ambulatory Visit (HOSPITAL_BASED_OUTPATIENT_CLINIC_OR_DEPARTMENT_OTHER)
Admission: RE | Admit: 2021-03-06 | Discharge: 2021-03-06 | Disposition: A | Discharge status: Home / Self Care | Payer: Medicare Other | Source: Ambulatory Visit | Attending: Infectious Disease | Admitting: Infectious Disease

## 2021-03-06 ENCOUNTER — Other Ambulatory Visit: Payer: Self-pay

## 2021-03-06 DIAGNOSIS — B181 Chronic viral hepatitis B without delta-agent: Secondary | ICD-10-CM | POA: Insufficient documentation

## 2021-03-10 ENCOUNTER — Other Ambulatory Visit: Payer: Self-pay | Admitting: Infectious Disease

## 2021-03-10 ENCOUNTER — Other Ambulatory Visit (HOSPITAL_COMMUNITY): Payer: Self-pay

## 2021-03-14 ENCOUNTER — Other Ambulatory Visit (HOSPITAL_COMMUNITY): Payer: Self-pay

## 2021-03-16 ENCOUNTER — Encounter: Payer: Self-pay | Admitting: Family Medicine

## 2021-03-20 ENCOUNTER — Other Ambulatory Visit (HOSPITAL_COMMUNITY): Payer: Self-pay

## 2021-03-24 ENCOUNTER — Other Ambulatory Visit (HOSPITAL_COMMUNITY): Payer: Self-pay

## 2021-04-05 ENCOUNTER — Encounter: Payer: Self-pay | Admitting: Family Medicine

## 2021-04-06 ENCOUNTER — Telehealth: Payer: Self-pay

## 2021-04-06 DIAGNOSIS — N401 Enlarged prostate with lower urinary tract symptoms: Secondary | ICD-10-CM

## 2021-04-06 MED ORDER — DOXAZOSIN MESYLATE 8 MG PO TABS: 8.0000 mg | ORAL_TABLET | Freq: Every day | ORAL | 3 refills | Status: DC

## 2021-04-06 NOTE — Telephone Encounter (Signed)
Pt called. He would like doxazosin sent to [facility] [address], [place], [place] [address]. Sent refill. ?

## 2021-04-11 ENCOUNTER — Other Ambulatory Visit (HOSPITAL_COMMUNITY): Payer: Self-pay

## 2021-04-12 ENCOUNTER — Other Ambulatory Visit (HOSPITAL_COMMUNITY): Payer: Self-pay

## 2021-04-13 ENCOUNTER — Other Ambulatory Visit: Payer: Self-pay | Admitting: Family Medicine

## 2021-04-13 ENCOUNTER — Telehealth: Payer: Self-pay

## 2021-04-13 NOTE — Telephone Encounter (Signed)
Refill request for Vit D 50000 units caps ? ?LOV - 02/14/21 ?Next OV - 05/15/21 ?Last refill - 02/14/21 #12/0 ?Last lab - 09/24/20 and was 18 ? ?

## 2021-04-13 NOTE — Progress Notes (Signed)
?  Transition CCM to Self Care ? ?Attempted contact with patient 3 times on 03/09, 03/10 and 03/11. Unsuccessful outreach.  ? ?Patient contacted to inform they have achieved their CCM goals and no longer need to be contacted as frequently. Patient advised services will still be available to them if they would like to reach out or have any new health concerns. Verified patient had contact information to pharmacist and health concierge on hand. Patient made aware CCM services would be continued if desired. Patient consented to cancel future CCM appointments. ? ?Al Corpus, CPP notified ? ?Claudina Lick, RMA ?Clinical Pharmacy Assistant ?[phone] ? ?

## 2021-04-14 ENCOUNTER — Other Ambulatory Visit (HOSPITAL_COMMUNITY): Payer: Self-pay

## 2021-04-14 NOTE — Telephone Encounter (Signed)
[email or web address]

## 2021-04-14 NOTE — Telephone Encounter (Signed)
I would recheck the level at his next lab appointment (orders in EMR) before refilling this.  Please update patient.  Thanks. ?

## 2021-04-18 ENCOUNTER — Other Ambulatory Visit (HOSPITAL_COMMUNITY): Payer: Self-pay

## 2021-04-24 ENCOUNTER — Ambulatory Visit (INDEPENDENT_AMBULATORY_CARE_PROVIDER_SITE_OTHER): Payer: Medicare Other

## 2021-04-24 VITALS — Wt 182.0 lb

## 2021-04-24 DIAGNOSIS — Z0000 Encounter for general adult medical examination without abnormal findings: Secondary | ICD-10-CM | POA: Diagnosis not present

## 2021-04-24 NOTE — Progress Notes (Signed)
? ?Subjective:  ? Jacob Ponce is a 81 y.o. male who presents for Medicare Annual/Subsequent preventive examination. ? ?Virtual Visit via Telephone Note ? ?I connected with  [name] on 04/24/21 at 10:30 AM EDT by telephone and verified that I am speaking with the correct person using two identifiers. ? ?Location: ?Patient: Home ?Provider: [facility] ?Persons participating in the virtual visit: patient/Nurse Health Advisor ?  ?I discussed the limitations, risks, security and privacy concerns of performing an evaluation and management service by telephone and the availability of in person appointments. The patient expressed understanding and agreed to proceed. ? ?Interactive audio and video telecommunications were attempted between this nurse and patient, however failed, due to patient having technical difficulties OR patient did not have access to video capability.  We continued and completed visit with audio only. ? ?Some vital signs may be absent or patient reported.  ? ?Derick Seminara Hurman Horn, LPN  ? ?Review of Systems    ? ?Cardiac Risk Factors include: advanced age (>82men, >11 women);diabetes mellitus;male gender;dyslipidemia;hypertension;Other (see comment), Risk factor comments: chronic viral hepatitis B ? ?   ?Objective:  ?  ?Today's Vitals  ? 04/24/21 1030  ?Weight: 182 lb (82.6 kg)  ? ?Body mass index is 26.11 kg/m?. ? ?Advanced Directives 04/24/2021 10/07/2019 09/14/2018 11/07/2017 09/20/2017 09/12/2016  ?Does Patient Have a Medical Advance Directive? Yes Yes Yes Yes Yes Yes  ?Type of Estate agent of [place];Living will Healthcare Power of [place];Living will Healthcare Power of [place];Living will Healthcare Power of [place];Living will Healthcare Power of [place];Living will Healthcare Power of [place];Living will  ?Copy of Healthcare Power of Attorney in Chart? No - copy requested No - copy requested No - copy requested - No - copy requested No - copy requested  ?Would patient  like information on creating a medical advance directive? - - No - Patient declined - - -  ? ? ?Current Medications (verified) ?Outpatient Encounter Medications as of 04/24/2021  ?Medication Sig  ? alfuzosin (UROXATRAL) 10 MG 24 hr tablet Take 10 mg by mouth daily.  ? aspirin 81 MG tablet Take 81 mg by mouth daily.  ? doxazosin (CARDURA) 8 MG tablet Take 1 tablet (8 mg total) by mouth at bedtime.  ? emtricitabine-tenofovir AF (DESCOVY) 200-25 MG tablet Take 1 tablet by mouth daily.  ? glucose blood (ONETOUCH ULTRA) test strip USE TO CHECK BLOOD SUGAR  DAILY. E11.9.  ? Lancets (ONETOUCH ULTRASOFT) lancets USE TO CHECK BLOOD SUGAR  DAILY  ? loratadine (CLARITIN) 10 MG tablet Take 10 mg by mouth daily as needed for allergies.  ? metFORMIN (GLUCOPHAGE) 500 MG tablet 2 tablets in the morning and 1 tablet in the evening.  With meals.  ? Multiple Vitamins-Minerals (PRESERVISION AREDS 2 PO) Take by mouth in the morning and at bedtime.  ? pioglitazone (ACTOS) 30 MG tablet Take 0.5 tablets (15 mg total) by mouth daily.  ? simvastatin (ZOCOR) 40 MG tablet Take 1 tablet (40 mg total) by mouth at bedtime.  ? Vitamin D, Ergocalciferol, (DRISDOL) 1.25 MG (50000 UNIT) CAPS capsule Take 1 capsule (50,000 Units total) by mouth every 7 (seven) days.  ? ?No facility-administered encounter medications on file as of 04/24/2021.  ? ? ?Allergies (verified) ?Alcohol, Cat hair extract, Pollen extract, and Tamsulosin  ? ?History: ?Past Medical History:  ?Diagnosis Date  ? BPH (benign prostatic hyperplasia)   ? Diabetes mellitus   ? Type II, controlled  ? Fracture 2011  ? Right proximal humerus,  resolved w/o deficit  ? HBV (hepatitis B virus) infection   ? History of cardiac cath 1999  ? and Tilt Table Test, both negative  ? Hyperlipidemia   ? Hypertension   ? Impotence of organic origin   ? Intertrigo 08/10/2020  ? On pre-exposure prophylaxis for HIV 02/10/2020  ? Special screening for malignant neoplasms of other sites   ? ?Past Surgical History:   ?Procedure Laterality Date  ? APPENDECTOMY    ? TIBIA FRACTURE SURGERY    ? Left, resolved without deficit  ? TONSILLECTOMY    ? ?Family History  ?Problem Relation Age of Onset  ? Cancer Father   ?     colon  ? Seizures Father   ?     after head injury  ? Colon cancer Father   ? Colon cancer Mother   ? Arthritis Brother   ? Prostate cancer Neg Hx   ? ?Social History  ? ?Socioeconomic History  ? Marital status: Married  ?  Spouse name: Not on file  ? Number of children: 4  ? Years of education: Not on file  ? Highest education level: Not on file  ?Occupational History  ? Occupation: Art gallery manager at [employer]  ?  Employer: [employer]  ? Occupation: Occupational hygienist  ?Tobacco Use  ? Smoking status: Former  ?  Types: Cigarettes  ?  Quit date: 02/05/1985  ?  Years since quitting: 36.2  ? Smokeless tobacco: Never  ? Tobacco comments:  ?  Smoked from 61 to 1987  ?Substance and Sexual Activity  ? Alcohol use: No  ?  Alcohol/week: 0.0 standard drinks  ? Drug use: No  ? Sexual activity: Never  ?  Comment: declined condoms  ?Other Topics Concern  ? Not on file  ?Social History Narrative  ? Undergrad and Masters at [employer].  ? Second marriage since 1987  ? 4 children, 3 step-children  ? Flew small aircraft- retired from flying 2019  ? Working 3-4 days a week as of 2022  ? ?Social Determinants of Health  ? ?Financial Resource Strain: Low Risk   ? Difficulty of Paying Living Expenses: Not hard at all  ?Food Insecurity: No Food Insecurity  ? Worried About Programme researcher, broadcasting/film/video in the Last Year: Never true  ? Ran Out of Food in the Last Year: Never true  ?Transportation Needs: No Transportation Needs  ? Lack of Transportation (Medical): No  ? Lack of Transportation (Non-Medical): No  ?Physical Activity: Sufficiently Active  ? Days of Exercise per Week: 7 days  ? Minutes of Exercise per Session: 40 min  ?Stress: No Stress Concern Present  ? Feeling of Stress : Not at all  ?Social Connections: Socially Integrated  ? Frequency of Communication with  Friends and Family: Twice a week  ? Frequency of Social Gatherings with Friends and Family: Twice a week  ? Attends Religious Services: More than 4 times per year  ? Active Member of Clubs or Organizations: Yes  ? Attends Banker Meetings: More than 4 times per year  ? Marital Status: Married  ? ? ?Tobacco Counseling ?Counseling given: Not Answered ?Tobacco comments: Smoked from 1966 to 1987 ? ? ?Clinical Intake: ? ?Pre-visit preparation completed: Yes ? ?Pain : No/denies pain ? ?  ? ?BMI - recorded: 26.11 ?Nutritional Status: BMI 25 -29 Overweight ?Nutritional Risks: None ?Diabetes: Yes ?CBG done?: No ?Did pt. bring in CBG monitor from home?: No ? ?How often do you need to  have someone help you when you read instructions, pamphlets, or other written materials from your doctor or pharmacy?: 1 - Never ? ?Diabetic? Nutrition Risk Assessment: ? ?Has the patient had any N/V/D within the last 2 months?  No  ?Does the patient have any non-healing wounds?  No  ?Has the patient had any unintentional weight loss or weight gain?  No  ? ?Diabetes: ? ?Is the patient diabetic?  Yes  ?If diabetic, was a CBG obtained today?  No  ?Did the patient bring in their glucometer from home?  No  ?How often do you monitor your CBG's? Once daily fasting - 152 this am per patient.  ? ?Financial Strains and Diabetes Management: ? ?Are you having any financial strains with the device, your supplies or your medication? No .  ?Does the patient want to be seen by Chronic Care Management for management of their diabetes?  No  ?Would the patient like to be referred to a Nutritionist or for Diabetic Management?  No  ? ?Diabetic Exams: ? ?Diabetic Eye Exam: Completed 08/2020.  ? ?Diabetic Foot Exam: Completed 09/16/2020. Pt has been advised about the importance in completing this exam.  ? ?Interpreter Needed?: No ? ?Information entered by :: Jaja Switalski, LPN ? ? ?Activities of Daily Living ?In your present state of health, do you have any  difficulty performing the following activities: 04/24/2021  ?Hearing? Y  ?Comment mild  ?Vision? N  ?Difficulty concentrating or making decisions? N  ?Walking or climbing stairs? N  ?Dressing or bathing

## 2021-04-24 NOTE — Patient Instructions (Signed)
[name] , ?Thank you for taking time to come for your Medicare Wellness Visit. I appreciate your ongoing commitment to your health goals. Please review the following plan we discussed and let me know if I can assist you in the future.  ? ?Screening recommendations/referrals: ?Colonoscopy: Done 08/16/2020 - Repeat in 5 years  ?Recommended yearly ophthalmology/optometry visit for glaucoma screening and checkup ?Recommended yearly dental visit for hygiene and checkup ? ?Vaccinations: ?Influenza vaccine: Done 11/25/20 - Repeat annually ?Pneumococcal vaccine: Done 09/18/2011 & 08/31/2014 - ask about Prevnar-20 ?Tdap vaccine: Done 12/04/2016 - Repeat in 10 years ?Shingles vaccine: Shingrix is 2 doses 2-6 months apart and over 90% effective     ?Covid-19: Done 03/12/19, 04/07/19, 11/21/19, & 11/25/20 ? ?Advanced directives: Please bring a copy of your health care power of attorney and living will to the office to be added to your chart at your convenience.  ? ?Conditions/risks identified: Aim for 30 minutes of exercise or brisk walking, 6-8 glasses of water, and 5 servings of fruits and vegetables each day.  ? ?Next appointment: Follow up in one year for your annual wellness visit.  ? ?Preventive Care 8 Years and Older, Male ? ?Preventive care refers to lifestyle choices and visits with your health care provider that can promote health and wellness. ?What does preventive care include? ?A yearly physical exam. This is also called an annual well check. ?Dental exams once or twice a year. ?Routine eye exams. Ask your health care provider how often you should have your eyes checked. ?Personal lifestyle choices, including: ?Daily care of your teeth and gums. ?Regular physical activity. ?Eating a healthy diet. ?Avoiding tobacco and drug use. ?Limiting alcohol use. ?Practicing safe sex. ?Taking low doses of aspirin every day. ?Taking vitamin and mineral supplements as recommended by your health care provider. ?What happens during an  annual well check? ?The services and screenings done by your health care provider during your annual well check will depend on your age, overall health, lifestyle risk factors, and family history of disease. ?Counseling  ?Your health care provider may ask you questions about your: ?Alcohol use. ?Tobacco use. ?Drug use. ?Emotional well-being. ?Home and relationship well-being. ?Sexual activity. ?Eating habits. ?History of falls. ?Memory and ability to understand (cognition). ?Work and work Astronomer. ?Screening  ?You may have the following tests or measurements: ?Height, weight, and BMI. ?Blood pressure. ?Lipid and cholesterol levels. These may be checked every 5 years, or more frequently if you are over 87 years old. ?Skin check. ?Lung cancer screening. You may have this screening every year starting at age 3 if you have a 30-pack-year history of smoking and currently smoke or have quit within the past 15 years. ?Fecal occult blood test (FOBT) of the stool. You may have this test every year starting at age 46. ?Flexible sigmoidoscopy or colonoscopy. You may have a sigmoidoscopy every 5 years or a colonoscopy every 10 years starting at age 67. ?Prostate cancer screening. Recommendations will vary depending on your family history and other risks. ?Hepatitis C blood test. ?Hepatitis B blood test. ?Sexually transmitted disease (STD) testing. ?Diabetes screening. This is done by checking your blood sugar (glucose) after you have not eaten for a while (fasting). You may have this done every 1-3 years. ?Abdominal aortic aneurysm (AAA) screening. You may need this if you are a current or former smoker. ?Osteoporosis. You may be screened starting at age 40 if you are at high risk. ?Talk with your health care provider about your test results,  treatment options, and if necessary, the need for more tests. ?Vaccines  ?Your health care provider may recommend certain vaccines, such as: ?Influenza vaccine. This is recommended  every year. ?Tetanus, diphtheria, and acellular pertussis (Tdap, Td) vaccine. You may need a Td booster every 10 years. ?Zoster vaccine. You may need this after age 69. ?Pneumococcal 13-valent conjugate (PCV13) vaccine. One dose is recommended after age 31. ?Pneumococcal polysaccharide (PPSV23) vaccine. One dose is recommended after age 72. ?Talk to your health care provider about which screenings and vaccines you need and how often you need them. ?This information is not intended to replace advice given to you by your health care provider. Make sure you discuss any questions you have with your health care provider. ?Document Released: 02/18/2015 Document Revised: 10/12/2015 Document Reviewed: 11/23/2014 ?Elsevier Interactive Patient Education ? 2017 [place]. ? ?Fall Prevention in the Home ?Falls can cause injuries. They can happen to people of all ages. There are many things you can do to make your home safe and to help prevent falls. ?What can I do on the outside of my home? ?Regularly fix the edges of walkways and driveways and fix any cracks. ?Remove anything that might make you trip as you walk through a door, such as a raised step or threshold. ?Trim any bushes or trees on the path to your home. ?Use bright outdoor lighting. ?Clear any walking paths of anything that might make someone trip, such as rocks or tools. ?Regularly check to see if handrails are loose or broken. Make sure that both sides of any steps have handrails. ?Any raised decks and porches should have guardrails on the edges. ?Have any leaves, snow, or ice cleared regularly. ?Use sand or salt on walking paths during winter. ?Clean up any spills in your garage right away. This includes oil or grease spills. ?What can I do in the bathroom? ?Use night lights. ?Install grab bars by the toilet and in the tub and shower. Do not use towel bars as grab bars. ?Use non-skid mats or decals in the tub or shower. ?If you need to sit down in the shower,  use a plastic, non-slip stool. ?Keep the floor dry. Clean up any water that spills on the floor as soon as it happens. ?Remove soap buildup in the tub or shower regularly. ?Attach bath mats securely with double-sided non-slip rug tape. ?Do not have throw rugs and other things on the floor that can make you trip. ?What can I do in the bedroom? ?Use night lights. ?Make sure that you have a light by your bed that is easy to reach. ?Do not use any sheets or blankets that are too big for your bed. They should not hang down onto the floor. ?Have a firm chair that has side arms. You can use this for support while you get dressed. ?Do not have throw rugs and other things on the floor that can make you trip. ?What can I do in the kitchen? ?Clean up any spills right away. ?Avoid walking on wet floors. ?Keep items that you use a lot in easy-to-reach places. ?If you need to reach something above you, use a strong step stool that has a grab bar. ?Keep electrical cords out of the way. ?Do not use floor polish or wax that makes floors slippery. If you must use wax, use non-skid floor wax. ?Do not have throw rugs and other things on the floor that can make you trip. ?What can I do with my stairs? ?Do  not leave any items on the stairs. ?Make sure that there are handrails on both sides of the stairs and use them. Fix handrails that are broken or loose. Make sure that handrails are as long as the stairways. ?Check any carpeting to make sure that it is firmly attached to the stairs. Fix any carpet that is loose or worn. ?Avoid having throw rugs at the top or bottom of the stairs. If you do have throw rugs, attach them to the floor with carpet tape. ?Make sure that you have a light switch at the top of the stairs and the bottom of the stairs. If you do not have them, ask someone to add them for you. ?What else can I do to help prevent falls? ?Wear shoes that: ?Do not have high heels. ?Have rubber bottoms. ?Are comfortable and fit you  well. ?Are closed at the toe. Do not wear sandals. ?If you use a stepladder: ?Make sure that it is fully opened. Do not climb a closed stepladder. ?Make sure that both sides of the stepladder are locked in

## 2021-04-27 ENCOUNTER — Other Ambulatory Visit (HOSPITAL_COMMUNITY): Payer: Self-pay

## 2021-05-08 ENCOUNTER — Other Ambulatory Visit (HOSPITAL_COMMUNITY): Payer: Self-pay

## 2021-05-09 ENCOUNTER — Telehealth: Payer: Medicare Other

## 2021-05-10 ENCOUNTER — Other Ambulatory Visit (HOSPITAL_COMMUNITY): Payer: Self-pay

## 2021-05-15 ENCOUNTER — Encounter: Payer: Self-pay | Admitting: Family Medicine

## 2021-05-15 ENCOUNTER — Ambulatory Visit (INDEPENDENT_AMBULATORY_CARE_PROVIDER_SITE_OTHER): Payer: Medicare Other | Admitting: Family Medicine

## 2021-05-15 VITALS — BP 118/80 | HR 93 | Temp 97.4°F | Ht 67.5 in | Wt 178.0 lb

## 2021-05-15 DIAGNOSIS — E559 Vitamin D deficiency, unspecified: Secondary | ICD-10-CM | POA: Diagnosis not present

## 2021-05-15 DIAGNOSIS — E119 Type 2 diabetes mellitus without complications: Secondary | ICD-10-CM

## 2021-05-15 LAB — HEMOGLOBIN A1C: Hgb A1c MFr Bld: 6.6 % — ABNORMAL HIGH (ref 4.6–6.5)

## 2021-05-15 LAB — VITAMIN D 25 HYDROXY (VIT D DEFICIENCY, FRACTURES): VITD: 53.58 ng/mL (ref 30.00–100.00)

## 2021-05-15 NOTE — Patient Instructions (Addendum)
Go to the lab on the way out.   If you have mychart we'll likely use that to update you.    ?Take care.  Glad to see you. ?Plan on a yearly visit in about 4-5 months.  We can do labs ahead of time or at the visit.   ?

## 2021-05-15 NOTE — Progress Notes (Signed)
Diabetes:  ?Using medications without difficulties: yes ?Hypoglycemic episodes:no ?Hyperglycemic episodes:no ?Feet problems:no ?Blood Sugars averaging: usually ~110s, occ higher.   ?eye exam within last year: due, pending ?A1c pending.  ? ?Recheck Vit D pending.  Has been on replacement.  See notes on labs. ? ?He is not using his back brace but is still using a cane.  Fall cautions d/w pt.   ? ?Still on antivirals per ID clinic.  He was able to get help on his medication cost, d/w pt.   ? ?Meds, vitals, and allergies reviewed.  ?ROS: Per HPI unless specifically indicated in ROS section  ? ?GEN: nad, alert and oriented ?HEENT: ncat ?NECK: supple w/o LA ?CV: rrr. ?PULM: ctab, no inc wob ?ABD: soft, +bs ?EXT: no edema ?SKIN: no acute rash ?

## 2021-05-17 ENCOUNTER — Other Ambulatory Visit (HOSPITAL_COMMUNITY): Payer: Self-pay

## 2021-05-18 ENCOUNTER — Other Ambulatory Visit: Payer: Self-pay | Admitting: Family Medicine

## 2021-05-18 DIAGNOSIS — E559 Vitamin D deficiency, unspecified: Secondary | ICD-10-CM | POA: Insufficient documentation

## 2021-05-18 MED ORDER — VITAMIN D3 50 MCG (2000 UT) PO CAPS: 2000.0000 [IU] | ORAL_CAPSULE | Freq: Every day | ORAL | Status: AC

## 2021-05-18 NOTE — Assessment & Plan Note (Signed)
See notes on labs.  Continue metformin and Actos. ?

## 2021-05-18 NOTE — Assessment & Plan Note (Signed)
See notes on labs. 

## 2021-06-07 ENCOUNTER — Encounter: Payer: Self-pay | Admitting: Infectious Disease

## 2021-06-09 ENCOUNTER — Other Ambulatory Visit (HOSPITAL_COMMUNITY): Payer: Self-pay

## 2021-06-15 DIAGNOSIS — H3554 Dystrophies primarily involving the retinal pigment epithelium: Secondary | ICD-10-CM | POA: Diagnosis not present

## 2021-06-15 DIAGNOSIS — H353132 Nonexudative age-related macular degeneration, bilateral, intermediate dry stage: Secondary | ICD-10-CM | POA: Diagnosis not present

## 2021-06-15 DIAGNOSIS — H16223 Keratoconjunctivitis sicca, not specified as Sjogren's, bilateral: Secondary | ICD-10-CM | POA: Diagnosis not present

## 2021-06-15 DIAGNOSIS — H35363 Drusen (degenerative) of macula, bilateral: Secondary | ICD-10-CM | POA: Diagnosis not present

## 2021-06-19 ENCOUNTER — Encounter: Payer: Self-pay | Admitting: Infectious Disease

## 2021-06-19 ENCOUNTER — Other Ambulatory Visit (HOSPITAL_COMMUNITY): Payer: Self-pay

## 2021-06-19 DIAGNOSIS — B181 Chronic viral hepatitis B without delta-agent: Secondary | ICD-10-CM

## 2021-06-19 DIAGNOSIS — Z79899 Other long term (current) drug therapy: Secondary | ICD-10-CM

## 2021-06-19 MED ORDER — DESCOVY 200-25 MG PO TABS
1.0000 | ORAL_TABLET | Freq: Every day | ORAL | 0 refills | Status: DC
  Filled 2021-06-19: qty 30, 30d supply, fill #0

## 2021-06-19 NOTE — Telephone Encounter (Signed)
Okay to refill per Dr. Daiva Eves.  ? ?Sandie Ano, RN ? ?

## 2021-06-20 ENCOUNTER — Other Ambulatory Visit (HOSPITAL_COMMUNITY): Payer: Self-pay

## 2021-07-05 ENCOUNTER — Encounter: Payer: Self-pay | Admitting: Family Medicine

## 2021-07-05 NOTE — Telephone Encounter (Signed)
I spoke with pt; for 1 1/2 wk pt has had more of a dry cough but on and off has prod cough with green plug that is blood tinged; last time prod cough was last night. Pt has wheezing which is worse at night. SOB upon exertion but is no worse than usual. No CP but pt said ribs do hurt from a lot of coughing. Pt said especially at night when gets up or goes back to bed pt has dizziness where room spins. Offered to schedule appt at [facility] due to no available appts at [facility] this afternoon. Pt scheduled appt with Audria Nine NP on 07/06/21 at 11:40 . UC & ED precautions given and pt voiced understanding. Sending note to Audria Nine NP, Anastasiya CMA.and FYI to Dr Para March as PCP. Pt is going to ck a home covid test this evening and if positive will call office otherwise will keep appt 07/06/21.

## 2021-07-06 ENCOUNTER — Encounter: Payer: Self-pay | Admitting: Nurse Practitioner

## 2021-07-06 ENCOUNTER — Ambulatory Visit (INDEPENDENT_AMBULATORY_CARE_PROVIDER_SITE_OTHER): Payer: Medicare Other | Admitting: Nurse Practitioner

## 2021-07-06 ENCOUNTER — Ambulatory Visit (INDEPENDENT_AMBULATORY_CARE_PROVIDER_SITE_OTHER)
Admission: RE | Admit: 2021-07-06 | Discharge: 2021-07-06 | Disposition: A | Discharge status: Home / Self Care | Payer: Medicare Other | Source: Ambulatory Visit | Attending: Nurse Practitioner | Admitting: Nurse Practitioner

## 2021-07-06 VITALS — BP 120/80 | HR 100 | Temp 98.0°F | Ht 67.5 in | Wt 176.0 lb

## 2021-07-06 DIAGNOSIS — H6121 Impacted cerumen, right ear: Secondary | ICD-10-CM | POA: Diagnosis not present

## 2021-07-06 DIAGNOSIS — R42 Dizziness and giddiness: Secondary | ICD-10-CM | POA: Diagnosis not present

## 2021-07-06 DIAGNOSIS — R0689 Other abnormalities of breathing: Secondary | ICD-10-CM

## 2021-07-06 DIAGNOSIS — R062 Wheezing: Secondary | ICD-10-CM

## 2021-07-06 DIAGNOSIS — J22 Unspecified acute lower respiratory infection: Secondary | ICD-10-CM

## 2021-07-06 DIAGNOSIS — R051 Acute cough: Secondary | ICD-10-CM | POA: Diagnosis not present

## 2021-07-06 DIAGNOSIS — R0602 Shortness of breath: Secondary | ICD-10-CM

## 2021-07-06 DIAGNOSIS — R0989 Other specified symptoms and signs involving the circulatory and respiratory systems: Secondary | ICD-10-CM | POA: Diagnosis not present

## 2021-07-06 MED ORDER — AMOXICILLIN-POT CLAVULANATE 875-125 MG PO TABS: 1.0000 | ORAL_TABLET | Freq: Two times a day (BID) | ORAL | 0 refills | Status: AC

## 2021-07-06 MED ORDER — ALBUTEROL SULFATE HFA 108 (90 BASE) MCG/ACT IN AERS: 2.0000 | INHALATION_SPRAY | Freq: Four times a day (QID) | RESPIRATORY_TRACT | 0 refills | Status: DC | PRN

## 2021-07-06 MED ORDER — BENZONATATE 100 MG PO CAPS: 100.0000 mg | ORAL_CAPSULE | Freq: Three times a day (TID) | ORAL | 0 refills | Status: AC | PRN

## 2021-07-06 NOTE — Patient Instructions (Signed)
Nice to see you today I sent in  medications to your pharmacy I will be in touch with the xray once I have the result Follow up if no improvement

## 2021-07-06 NOTE — Assessment & Plan Note (Signed)
Albuterol inhaler 1 to 2 puffs every 6 hours as needed for wheezing/shortness of breath

## 2021-07-06 NOTE — Assessment & Plan Note (Signed)
Verbal consent obtained.  Cerumen softening drops were placed.  According to office policy water hydroperoxide mixture was used and ear was flushed.  Disimpaction was unsuccessful.  Patient did have pain with procedure and procedure was stopped did recommend patient using over-the-counter ear softening eardrops for approximately 3 days or what the directions said is scheduling another appointment to have a ear flush to see if we can get the cerumen impaction out.  Patient acknowledged

## 2021-07-06 NOTE — Assessment & Plan Note (Signed)
Dyspnea on exertion since illness.  Will obtain chest x-ray, pending result.  Sent in albuterol inhaler 2 puffs every 6 hours as needed for shortness of breath or wheezing.

## 2021-07-06 NOTE — Telephone Encounter (Signed)
Noted. Thanks.

## 2021-07-06 NOTE — Assessment & Plan Note (Signed)
Multifactorial in regards to cerumen impaction and respiratory illness.  Patient already taking antihistamine.  Neurological exam intact in office.  We will treat respiratory infection see if this improves if not we can always try meclizine over-the-counter

## 2021-07-06 NOTE — Assessment & Plan Note (Signed)
Will write Tessalon Perles 100 mg 3 times daily as needed cough

## 2021-07-06 NOTE — Assessment & Plan Note (Signed)
History of pneumonia.  Pending chest x-ray in office today.  Suspect lower respiratory infection will treat preemptively with Augmentin 875-125 mg twice daily for 7 days

## 2021-07-06 NOTE — Telephone Encounter (Signed)
[facility] Night - Client Nonclinical Telephone Record  [facility] Client [name] Night - Client Client Site [facility] - Night Provider AA - PHYSICIAN, NOT LISTED- MD Contact Type Call Who Is Calling Patient / Member / Family / Caregiver Caller Name Jacob Ponce Caller Phone Number [phone] Call Type Message Only Information Provided Reason for Call Returning a Call from the Office Initial Comment Caller states he is returning a call. Disp. Time Disposition Final User 07/05/2021 5:50:05 PM General Information Provided Yes Clemon Chambers Call Closed By: Clemon Chambers Transaction Date/Time: 07/05/2021 5:48:02 PM (ET

## 2021-07-06 NOTE — Progress Notes (Signed)
G chest   Acute Office Visit  Subjective:     Patient ID: MAVERYCK BAHRI, male    DOB: Mar 22, 1940, 81 y.o.   MRN: [id number]  Chief Complaint  Patient presents with   Cough    C/o prod cough, DOE and wheezing- worse at night. Also, c/o vertigo Sxs started 1.5 wks ago. Neg home COVID test yesterday.      Patient is in today for Cough  Symptoms started approx 1.5 weeks ago No sick contacts Negative covid test at home yesterday States symptoms are waxing wanning.  States that he has had vertigo for at least the past week. Worse with position changes.  Per patient report when his head goes from horizontal to vertical this when he feels at the most.  Has had episodes of vertigo in the past but generally short lived.   Review of Systems  Constitutional:  Positive for malaise/fatigue. Negative for chills and fever.  HENT:  Positive for congestion. Negative for ear discharge, ear pain, sinus pain and sore throat.   Respiratory:  Positive for cough (sometime productive.), shortness of breath (DOE and steps) and wheezing.   Cardiovascular:  Negative for chest pain.  Gastrointestinal:  Negative for abdominal pain, diarrhea, nausea and vomiting.  Neurological:  Positive for dizziness. Negative for headaches.       Objective:    BP 120/80   Pulse 100   Temp 98 F (36.7 C) (Temporal)   Ht 5' 7.5" (1.715 m)   Wt 176 lb (79.8 kg)   SpO2 94%   BMI 27.16 kg/m    Physical Exam Vitals and nursing note reviewed.  Constitutional:      Appearance: Normal appearance.  HENT:     Right Ear: Ear canal and external ear normal. There is impacted cerumen.     Left Ear: Tympanic membrane, ear canal and external ear normal. There is no impacted cerumen.     Mouth/Throat:     Mouth: Mucous membranes are moist.     Pharynx: Oropharynx is clear.  Eyes:     Extraocular Movements: Extraocular movements intact.     Pupils: Pupils are equal, round, and reactive to light.     Comments: Wears  glasses  Cardiovascular:     Rate and Rhythm: Normal rate and regular rhythm.     Heart sounds: Normal heart sounds.  Pulmonary:     Effort: Pulmonary effort is normal.     Breath sounds: Wheezing and rhonchi present.  Lymphadenopathy:     Cervical: No cervical adenopathy.  Skin:    General: Skin is warm.  Neurological:     General: No focal deficit present.     Mental Status: He is alert.     Motor: No weakness.     Coordination: Coordination normal. Finger-Nose-Finger Test normal.     Gait: Gait normal.     Deep Tendon Reflexes: Reflexes normal.     Reflex Scores:      Bicep reflexes are 2+ on the right side and 2+ on the left side.      Patellar reflexes are 2+ on the right side and 2+ on the left side.    Comments: Bilateral upper and lower extremity strength 5/5    No results found for any visits on 07/06/21.      Assessment & Plan:   Problem List Items Addressed This Visit       Respiratory   Lower respiratory infection - Primary    History of pneumonia.  Pending chest x-ray in office today.  Suspect lower respiratory infection will treat preemptively with Augmentin 875-125 mg twice daily for 7 days       Relevant Medications   amoxicillin-clavulanate (AUGMENTIN) 875-125 MG tablet     Nervous and Auditory   Impacted cerumen of right ear    Verbal consent obtained.  Cerumen softening drops were placed.  According to office policy water hydroperoxide mixture was used and ear was flushed.  Disimpaction was unsuccessful.  Patient did have pain with procedure and procedure was stopped did recommend patient using over-the-counter ear softening eardrops for approximately 3 days or what the directions said is scheduling another appointment to have a ear flush to see if we can get the cerumen impaction out.  Patient acknowledged       Relevant Orders   Ear Lavage     Other   Acute cough    Will write Tessalon Perles 100 mg 3 times daily as needed cough        Relevant Medications   benzonatate (TESSALON PERLES) 100 MG capsule   Adventitious breath sounds    Obtain chest x-ray in office, pending result       Relevant Orders   DG Chest 2 View   Lightheadedness    Multifactorial in regards to cerumen impaction and respiratory illness.  Patient already taking antihistamine.  Neurological exam intact in office.  We will treat respiratory infection see if this improves if not we can always try meclizine over-the-counter       Wheezing    Albuterol inhaler 1 to 2 puffs every 6 hours as needed for wheezing/shortness of breath       Relevant Medications   albuterol (VENTOLIN HFA) 108 (90 Base) MCG/ACT inhaler   Other Relevant Orders   DG Chest 2 View   Shortness of breath    Dyspnea on exertion since illness.  Will obtain chest x-ray, pending result.  Sent in albuterol inhaler 2 puffs every 6 hours as needed for shortness of breath or wheezing.       Relevant Medications   albuterol (VENTOLIN HFA) 108 (90 Base) MCG/ACT inhaler   Other Relevant Orders   DG Chest 2 View    Meds ordered this encounter  Medications   amoxicillin-clavulanate (AUGMENTIN) 875-125 MG tablet    Sig: Take 1 tablet by mouth 2 (two) times daily for 7 days.    Dispense:  14 tablet    Refill:  0    Order Specific Question:   Supervising Provider    Answer:   Milinda Antis MARNE A [[id number]]   benzonatate (TESSALON PERLES) 100 MG capsule    Sig: Take 1 capsule (100 mg total) by mouth 3 (three) times daily as needed for up to 7 days for cough.    Dispense:  21 capsule    Refill:  0    Order Specific Question:   Supervising Provider    Answer:   Roxy Manns A [[id number]]   albuterol (VENTOLIN HFA) 108 (90 Base) MCG/ACT inhaler    Sig: Inhale 2 puffs into the lungs every 6 (six) hours as needed for wheezing or shortness of breath.    Dispense:  8 g    Refill:  0    Order Specific Question:   Supervising Provider    Answer:   TOWER, MARNE A [[id number]]    No follow-ups on  file.  Audria Nine, NP

## 2021-07-06 NOTE — Assessment & Plan Note (Signed)
Obtain chest x-ray in office, pending result

## 2021-07-12 ENCOUNTER — Other Ambulatory Visit (HOSPITAL_COMMUNITY): Payer: Self-pay

## 2021-07-12 DIAGNOSIS — M4013 Other secondary kyphosis, cervicothoracic region: Secondary | ICD-10-CM | POA: Diagnosis not present

## 2021-07-12 DIAGNOSIS — S22020D Wedge compression fracture of second thoracic vertebra, subsequent encounter for fracture with routine healing: Secondary | ICD-10-CM | POA: Diagnosis not present

## 2021-07-12 DIAGNOSIS — M810 Age-related osteoporosis without current pathological fracture: Secondary | ICD-10-CM | POA: Diagnosis not present

## 2021-07-12 DIAGNOSIS — M47814 Spondylosis without myelopathy or radiculopathy, thoracic region: Secondary | ICD-10-CM | POA: Diagnosis not present

## 2021-07-12 DIAGNOSIS — S22030D Wedge compression fracture of third thoracic vertebra, subsequent encounter for fracture with routine healing: Secondary | ICD-10-CM | POA: Diagnosis not present

## 2021-07-12 DIAGNOSIS — S22010D Wedge compression fracture of first thoracic vertebra, subsequent encounter for fracture with routine healing: Secondary | ICD-10-CM | POA: Diagnosis not present

## 2021-07-14 ENCOUNTER — Other Ambulatory Visit (HOSPITAL_COMMUNITY): Payer: Self-pay

## 2021-07-17 ENCOUNTER — Other Ambulatory Visit (HOSPITAL_COMMUNITY): Payer: Self-pay

## 2021-07-19 ENCOUNTER — Other Ambulatory Visit (HOSPITAL_COMMUNITY): Payer: Self-pay

## 2021-07-31 ENCOUNTER — Other Ambulatory Visit (HOSPITAL_COMMUNITY): Payer: Self-pay

## 2021-07-31 ENCOUNTER — Other Ambulatory Visit: Payer: Self-pay

## 2021-07-31 ENCOUNTER — Encounter: Payer: Self-pay | Admitting: Infectious Disease

## 2021-07-31 DIAGNOSIS — B181 Chronic viral hepatitis B without delta-agent: Secondary | ICD-10-CM

## 2021-07-31 DIAGNOSIS — Z79899 Other long term (current) drug therapy: Secondary | ICD-10-CM

## 2021-07-31 MED ORDER — DESCOVY 200-25 MG PO TABS
1.0000 | ORAL_TABLET | Freq: Every day | ORAL | 0 refills | Status: DC
  Filled 2021-07-31: qty 30, 30d supply, fill #0

## 2021-08-02 ENCOUNTER — Other Ambulatory Visit: Payer: Self-pay | Admitting: Nurse Practitioner

## 2021-08-02 DIAGNOSIS — R0602 Shortness of breath: Secondary | ICD-10-CM

## 2021-08-02 DIAGNOSIS — R062 Wheezing: Secondary | ICD-10-CM

## 2021-08-04 ENCOUNTER — Encounter: Payer: Self-pay | Admitting: Infectious Disease

## 2021-08-04 ENCOUNTER — Ambulatory Visit: Payer: Medicare Other | Admitting: Infectious Disease

## 2021-08-04 ENCOUNTER — Other Ambulatory Visit: Payer: Self-pay

## 2021-08-04 ENCOUNTER — Other Ambulatory Visit (HOSPITAL_COMMUNITY)
Admission: RE | Admit: 2021-08-04 | Discharge: 2021-08-04 | Disposition: A | Discharge status: Home / Self Care | Payer: Medicare Other | Source: Ambulatory Visit | Attending: Infectious Disease | Admitting: Infectious Disease

## 2021-08-04 ENCOUNTER — Other Ambulatory Visit (HOSPITAL_COMMUNITY): Payer: Self-pay

## 2021-08-04 VITALS — BP 131/89 | HR 78 | Wt 176.8 lb

## 2021-08-04 DIAGNOSIS — Z79899 Other long term (current) drug therapy: Secondary | ICD-10-CM

## 2021-08-04 DIAGNOSIS — B181 Chronic viral hepatitis B without delta-agent: Secondary | ICD-10-CM

## 2021-08-04 DIAGNOSIS — L910 Hypertrophic scar: Secondary | ICD-10-CM | POA: Diagnosis not present

## 2021-08-04 HISTORY — DX: Hypertrophic scar: L91.0

## 2021-08-04 MED ORDER — DESCOVY 200-25 MG PO TABS
1.0000 | ORAL_TABLET | Freq: Every day | ORAL | 2 refills | Status: DC
  Filled 2021-08-04 – 2021-08-17 (×2): qty 30, 30d supply, fill #0
  Filled 2021-09-12: qty 30, 30d supply, fill #1
  Filled 2021-10-12: qty 30, 30d supply, fill #2

## 2021-08-04 NOTE — Progress Notes (Signed)
Subjective:  Chief complaint follow-up : Follow-up for chronic hepatitis B without hepatic coma and HIV preexposure prophylaxis  Patient ID: Jacob Ponce, male    DOB: [birth year], 81 y.o.   MRN: [id number]  HPI  Jacob Ponce is a 81 year old man with DM, HTN, hyperlipidemia who contracted Hepatitis B in 2020/ He had had unprotected anal intercourse with another man in July 2020 and went for testing at the [facility] where he tested negative. He has been on Descovy to treat his hepatitis B without hepatic coma and provide HIV preexposure prophylaxis.  Interval history below:   "He then was hospitalized with pneumonia and found to have an acute elevation of his transaminases in August 2020 with his ALT being 511 and AST 165 and his HIV test was negative at that time.   Since then he had been seen by Dr. Matthias Hughs who has performed serologies on the patient as recently as April 2021.  At that time his hepatitis B surface antigen remained positive his hepatitis B E antigen was positive his hepatitis B E antigen antibody was negative his surface antibody qualitative was negative and his hepatitis B core antibody was positive on IgM testing in total.   Tested negative for hepatitis C.  We checked hepatitis B viral load which was at 530,000 copies. Transaminases were relatively stable. I did feel that it would be prudent to initiate therapy given his age in particular for hepatitis B but to do so in the form of DESCOVY which would also serve him in providing preexposure prophylaxis.  He had  been on DESCOVY since June 2021 and had seemed to tolerated quite well.   In the interim he then began  suffering from a pruritic rash that responded somewhat to topical triamcinolone.  He is also was prescribed permethrin.  He had not responded to antihistamines and topical corticosteroids I referred him to dermatology but he says his appointment with them  These areas became quite excoriated and he was treated with  doxycycline by Dr. Para March his PCP  Para March also obtained a culture which yielded group A streptococcus.  Patient's rash did get a little bit better while on doxy.  However he continued to have intense itching and still has presence of the rash.  Due to concerns that the Descovy might be causing the rash switched  him over to Truvada.  I was under the impression that the rash had improved after he had come off DESCOVY with the other intervention as these began to apply topical Goldbond to his arms and he has had improvement in the rash.  I had referred him to [facility] dermatology but he was quite frustrated with trying to even see them as he could not see them for months and they would not take his insurance.  Was complaining of some abdominal discomfort at last visit.  We reviewed the different benefits and risks to both the Truvada and DESCOVY and preferred to stay on DESCOVY  I also considered other options in terms of preexposure prophylaxis would be Apretude IM, this would not cover his hepatitis B we could use another agent potentially such as entecavir to try to cover the hep B  Since I saw him his rash has continued to improve and he apparently is not using the Goldbond mu  He then had an episode of what sounds like micturition syncope resulting in fracture to his fifth metacarpal as well as sustaining a compression fracture of the T3 vertebra.  He was hospitalized at [facility].     Lupita Leash was able to work with him to ensure that DESCOVY was affordable and he was able to get a month supply for $5 co-pay.  He was grateful and brought down to give that he wanted to give to her personally today.  He remains sexually active with 1 partner who tested negative for HIV but is not on preexposure prophylaxis.       Past Medical History:  Diagnosis Date   BPH (benign prostatic hyperplasia)    Diabetes mellitus    Type II, controlled   Fracture 2011   Right proximal  humerus, resolved w/o deficit   HBV (hepatitis B virus) infection    History of cardiac cath 1999   and Tilt Table Test, both negative   Hyperlipidemia    Hypertension    Impotence of organic origin    Intertrigo 08/10/2020   On pre-exposure prophylaxis for HIV 02/10/2020   Special screening for malignant neoplasms of other sites     Past Surgical History:  Procedure Laterality Date   APPENDECTOMY     TIBIA FRACTURE SURGERY     Left, resolved without deficit   TONSILLECTOMY      Family History  Problem Relation Age of Onset   Cancer Father        colon   Seizures Father        after head injury   Colon cancer Father    Colon cancer Mother    Arthritis Brother    Prostate cancer Neg Hx       Social History   Socioeconomic History   Marital status: Married    Spouse name: Not on file   Number of children: 4   Years of education: Not on file   Highest education level: Not on file  Occupational History   Occupation: Art gallery manager at [employer]    Employer: [employer]   Occupation: Pilot  Tobacco Use   Smoking status: Former    Types: Cigarettes    Quit date: 02/05/1985    Years since quitting: 36.5   Smokeless tobacco: Never   Tobacco comments:    Smoked from 1966 to 1987  Substance and Sexual Activity   Alcohol use: No    Alcohol/week: 0.0 standard drinks of alcohol   Drug use: No   Sexual activity: Never    Comment: declined condoms  Other Topics Concern   Not on file  Social History Narrative   Undergrad and Masters at [employer].   Second marriage since 1987   4 children, 3 step-children   Flew small aircraft- retired from flying 2019   Working 3-4 days a week as of 2022   Social Determinants of Health   Financial Resource Strain: Low Risk  (04/24/2021)   Overall Financial Resource Strain (CARDIA)    Difficulty of Paying Living Expenses: Not hard at all  Food Insecurity: No Food Insecurity (04/24/2021)   Hunger Vital Sign    Worried About Running Out of Food  in the Last Year: Never true    Ran Out of Food in the Last Year: Never true  Transportation Needs: No Transportation Needs (04/24/2021)   PRAPARE - Administrator, Civil Service (Medical): No    Lack of Transportation (Non-Medical): No  Physical Activity: Sufficiently Active (04/24/2021)   Exercise Vital Sign    Days of Exercise per Week: 7 days    Minutes of Exercise per Session: 40  min  Stress: No Stress Concern Present (04/24/2021)   [employer] of Occupational Health - Occupational Stress Questionnaire    Feeling of Stress : Not at all  Social Connections: Socially Integrated (04/24/2021)   Social Connection and Isolation Panel [NHANES]    Frequency of Communication with Friends and Family: Twice a week    Frequency of Social Gatherings with Friends and Family: Twice a week    Attends Religious Services: More than 4 times per year    Active Member of [employer] or Organizations: Yes    Attends Engineer, structural: More than 4 times per year    Marital Status: Married    Allergies  Allergen Reactions   Alcohol Other (See Comments)   Cat Hair Extract Other (See Comments)   Pollen Extract Other (See Comments)   Tamsulosin     Would avoid based on ophthalmology recommendation.     Current Outpatient Medications:    albuterol (VENTOLIN HFA) 108 (90 Base) MCG/ACT inhaler, Inhale 2 puffs into the lungs every 6 (six) hours as needed for wheezing or shortness of breath., Disp: 8 g, Rfl: 0   alfuzosin (UROXATRAL) 10 MG 24 hr tablet, Take 10 mg by mouth daily., Disp: , Rfl:    aspirin 81 MG tablet, Take 81 mg by mouth daily., Disp: , Rfl:    Cholecalciferol (VITAMIN D3) 50 MCG (2000 UT) capsule, Take 1 capsule (2,000 Units total) by mouth daily. (Patient not taking: Reported on 07/06/2021), Disp: , Rfl:    doxazosin (CARDURA) 8 MG tablet, Take 1 tablet (8 mg total) by mouth at bedtime., Disp: 90 tablet, Rfl: 3   emtricitabine-tenofovir AF (DESCOVY) 200-25 MG tablet,  Take 1 tablet by mouth daily., Disp: 30 tablet, Rfl: 0   glucose blood (ONETOUCH ULTRA) test strip, USE TO CHECK BLOOD SUGAR  DAILY. E11.9., Disp: 100 strip, Rfl: 3   Lancets (ONETOUCH ULTRASOFT) lancets, USE TO CHECK BLOOD SUGAR  DAILY, Disp: 100 each, Rfl: 3   loratadine (CLARITIN) 10 MG tablet, Take 10 mg by mouth daily as needed for allergies., Disp: , Rfl:    metFORMIN (GLUCOPHAGE) 500 MG tablet, 2 tablets in the morning and 1 tablet in the evening.  With meals., Disp: 270 tablet, Rfl: 3   Multiple Vitamins-Minerals (PRESERVISION AREDS 2 PO), Take by mouth in the morning and at bedtime., Disp: , Rfl:    pioglitazone (ACTOS) 30 MG tablet, Take 0.5 tablets (15 mg total) by mouth daily., Disp: 45 tablet, Rfl: 3   simvastatin (ZOCOR) 40 MG tablet, Take 1 tablet (40 mg total) by mouth at bedtime., Disp: 90 tablet, Rfl: 3   Review of Systems  Constitutional:  Negative for activity change, appetite change, chills, diaphoresis, fatigue, fever and unexpected weight change.  HENT:  Negative for congestion, rhinorrhea, sinus pressure, sneezing, sore throat and trouble swallowing.   Eyes:  Negative for photophobia and visual disturbance.  Respiratory:  Negative for cough, chest tightness, shortness of breath, wheezing and stridor.   Cardiovascular:  Negative for chest pain, palpitations and leg swelling.  Gastrointestinal:  Negative for abdominal distention, abdominal pain, anal bleeding, blood in stool, constipation, diarrhea, nausea and vomiting.  Genitourinary:  Negative for difficulty urinating, dysuria, flank pain and hematuria.  Musculoskeletal:  Negative for arthralgias, back pain, gait problem, joint swelling and myalgias.  Skin:  Negative for color change, pallor, rash and wound.  Neurological:  Negative for dizziness, tremors, weakness and light-headedness.  Hematological:  Negative for adenopathy. Does not bruise/bleed easily.  Psychiatric/Behavioral:  Negative for agitation, behavioral  problems, confusion, decreased concentration, dysphoric mood and sleep disturbance.        Objective:   Physical Exam Constitutional:      Appearance: He is well-developed.  HENT:     Head: Normocephalic and atraumatic.  Eyes:     Conjunctiva/sclera: Conjunctivae normal.  Cardiovascular:     Rate and Rhythm: Normal rate and regular rhythm.  Pulmonary:     Effort: Pulmonary effort is normal. No respiratory distress.     Breath sounds: No wheezing.  Abdominal:     General: There is no distension.     Palpations: Abdomen is soft.  Musculoskeletal:        General: No tenderness. Normal range of motion.     Cervical back: Normal range of motion and neck supple.  Skin:    General: Skin is warm and dry.     Coloration: Skin is not pale.     Findings: No erythema or rash.  Neurological:     General: No focal deficit present.     Mental Status: He is alert and oriented to person, place, and time.  Psychiatric:        Mood and Affect: Mood normal.        Behavior: Behavior normal.        Thought Content: Thought content normal.        Judgment: Judgment normal.    He is wearing a brace which restricts his movement     Assessment & Plan:   Chronic hepatitis B without hepatic coma or delta agent:  I am rechecking a CMP as well as a hepatitis C antigen antibody.  I am screening for hepatocellular carcinoma with an ultrasound of the right upper quadrant.  On HIV preexposure prophylaxis:  We will recheck an HIV antibody today, syphilis test testing for gonorrhea and chlamydia at genital and extragenital sites.

## 2021-08-07 LAB — URINE CYTOLOGY ANCILLARY ONLY
Chlamydia: NEGATIVE
Comment: NEGATIVE
Comment: NORMAL
Neisseria Gonorrhea: NEGATIVE

## 2021-08-07 LAB — CYTOLOGY, (ORAL, ANAL, URETHRAL) ANCILLARY ONLY
Chlamydia: NEGATIVE
Chlamydia: NEGATIVE
Comment: NEGATIVE
Comment: NEGATIVE
Comment: NORMAL
Comment: NORMAL
Neisseria Gonorrhea: NEGATIVE
Neisseria Gonorrhea: NEGATIVE

## 2021-08-07 LAB — RPR: RPR Ser Ql: NONREACTIVE

## 2021-08-07 LAB — COMPLETE METABOLIC PANEL WITH GFR
AG Ratio: 2.4 (calc) (ref 1.0–2.5)
ALT: 20 U/L (ref 9–46)
AST: 17 U/L (ref 10–35)
Albumin: 4.6 g/dL (ref 3.6–5.1)
Alkaline phosphatase (APISO): 41 U/L (ref 35–144)
BUN/Creatinine Ratio: 29 (calc) — ABNORMAL HIGH (ref 6–22)
BUN: 29 mg/dL — ABNORMAL HIGH (ref 7–25)
CO2: 24 mmol/L (ref 20–32)
Calcium: 10.1 mg/dL (ref 8.6–10.3)
Chloride: 106 mmol/L (ref 98–110)
Creat: 1.01 mg/dL (ref 0.70–1.28)
Globulin: 1.9 g/dL (calc) (ref 1.9–3.7)
Glucose, Bld: 126 mg/dL — ABNORMAL HIGH (ref 65–99)
Potassium: 4.2 mmol/L (ref 3.5–5.3)
Sodium: 140 mmol/L (ref 135–146)
Total Bilirubin: 0.7 mg/dL (ref 0.2–1.2)
Total Protein: 6.5 g/dL (ref 6.1–8.1)
eGFR: 76 mL/min/{1.73_m2} (ref >=60)

## 2021-08-07 LAB — HIV ANTIBODY (ROUTINE TESTING W REFLEX): HIV 1&2 Ab, 4th Generation: NONREACTIVE

## 2021-08-07 LAB — HEPATITIS B E ANTIBODY: Hep B E Ab: NONREACTIVE

## 2021-08-16 ENCOUNTER — Other Ambulatory Visit (HOSPITAL_COMMUNITY): Payer: Self-pay

## 2021-08-17 ENCOUNTER — Other Ambulatory Visit (HOSPITAL_COMMUNITY): Payer: Self-pay

## 2021-08-21 ENCOUNTER — Other Ambulatory Visit: Payer: Medicare Other

## 2021-08-24 ENCOUNTER — Other Ambulatory Visit (HOSPITAL_COMMUNITY): Payer: Self-pay

## 2021-09-11 ENCOUNTER — Ambulatory Visit
Admission: RE | Admit: 2021-09-11 | Discharge: 2021-09-11 | Disposition: A | Discharge status: Home / Self Care | Payer: Medicare Other | Source: Ambulatory Visit | Attending: Infectious Disease | Admitting: Infectious Disease

## 2021-09-11 DIAGNOSIS — B181 Chronic viral hepatitis B without delta-agent: Secondary | ICD-10-CM

## 2021-09-12 ENCOUNTER — Other Ambulatory Visit (HOSPITAL_COMMUNITY): Payer: Self-pay

## 2021-09-18 ENCOUNTER — Ambulatory Visit: Payer: Medicare Other | Admitting: Family Medicine

## 2021-09-18 ENCOUNTER — Encounter: Payer: Self-pay | Admitting: Family Medicine

## 2021-09-18 VITALS — BP 114/70 | HR 87 | Temp 97.3°F | Ht 67.5 in | Wt 179.0 lb

## 2021-09-18 DIAGNOSIS — I10 Essential (primary) hypertension: Secondary | ICD-10-CM

## 2021-09-18 DIAGNOSIS — Z7189 Other specified counseling: Secondary | ICD-10-CM

## 2021-09-18 DIAGNOSIS — E119 Type 2 diabetes mellitus without complications: Secondary | ICD-10-CM | POA: Diagnosis not present

## 2021-09-18 DIAGNOSIS — E785 Hyperlipidemia, unspecified: Secondary | ICD-10-CM

## 2021-09-18 DIAGNOSIS — Z0000 Encounter for general adult medical examination without abnormal findings: Secondary | ICD-10-CM

## 2021-09-18 LAB — LIPID PANEL
Cholesterol: 121 mg/dL (ref 0–200)
HDL: 44.8 mg/dL (ref >39.00)
LDL Cholesterol: 59 mg/dL (ref 0–99)
NonHDL: 75.78
Total CHOL/HDL Ratio: 3
Triglycerides: 84 mg/dL (ref 0.0–149.0)
VLDL: 16.8 mg/dL (ref 0.0–40.0)

## 2021-09-18 LAB — HEMOGLOBIN A1C: Hgb A1c MFr Bld: 6.9 % — ABNORMAL HIGH (ref 4.6–6.5)

## 2021-09-18 NOTE — Patient Instructions (Addendum)
Plan on recheck in about 4 months.  Labs at the visit.   Go to the lab on the way out.   If you have mychart we'll likely use that to update you.    Take care.  Glad to see you. We'll call about seeing podiatry.

## 2021-09-18 NOTE — Progress Notes (Unsigned)
Diabetes:  Using medications without difficulties: yes Hypoglycemic episodes:no Hyperglycemic episodes:no Feet problems: nails elongated.   Blood Sugars averaging: ~130 eye exam within last year: yes, 06/15/21  Elevated Cholesterol: Using medications without problems: yes Muscle aches:  no Diet compliance: d/w pt.   Exercise: yes Labs pending.    Hypertension:    Using medication without problems or lightheadedness: yes Chest pain with exertion: no Edema:no Short of breath:no  Descovy per ID.  I'll defer.  He agrees.    Flu and shingles d/w pt.   He can get at pharmacy.  covid vaccine 2021 Tdap 2018 PNA up to date.  Advance directive- wife designated if patient were incapacitated.  Colonoscopy 2022  Meds, vitals, and allergies reviewed.   ROS: Per HPI unless specifically indicated in ROS section   GEN: nad, alert and oriented HEENT: ncat NECK: supple w/o LA CV: rrr. PULM: ctab, no inc wob ABD: soft, +bs EXT: no edema SKIN: no acute rash  Diabetic foot exam: Normal inspection No skin breakdown No calluses  Normal DP pulses Normal sensation to light touch and monofilament Nails thickened x10.

## 2021-09-20 NOTE — Assessment & Plan Note (Signed)
Continue simvastatin as is.

## 2021-09-20 NOTE — Assessment & Plan Note (Signed)
Refer to podiatry.  No change in medications at this point.  Continue work on diet and exercise.  Continue Actos and metformin.

## 2021-09-20 NOTE — Assessment & Plan Note (Signed)
Continue doxazosin 

## 2021-09-20 NOTE — Assessment & Plan Note (Signed)
Flu and shingles d/w pt.   He can get at pharmacy.  covid vaccine 2021 Tdap 2018 PNA up to date.  Advance directive- wife designated if patient were incapacitated.  Colonoscopy 2022

## 2021-09-20 NOTE — Assessment & Plan Note (Signed)
Advance directive- wife designated if patient were incapacitated.  

## 2021-09-21 ENCOUNTER — Other Ambulatory Visit (HOSPITAL_COMMUNITY): Payer: Self-pay

## 2021-09-27 ENCOUNTER — Encounter: Payer: Self-pay | Admitting: Infectious Disease

## 2021-09-28 ENCOUNTER — Ambulatory Visit: Payer: Medicare Other | Admitting: Podiatry

## 2021-09-29 ENCOUNTER — Encounter: Payer: Self-pay | Admitting: Podiatry

## 2021-09-29 ENCOUNTER — Ambulatory Visit: Payer: Medicare Other | Admitting: Podiatry

## 2021-09-29 DIAGNOSIS — B351 Tinea unguium: Secondary | ICD-10-CM

## 2021-09-29 DIAGNOSIS — M79609 Pain in unspecified limb: Secondary | ICD-10-CM

## 2021-09-29 DIAGNOSIS — E119 Type 2 diabetes mellitus without complications: Secondary | ICD-10-CM

## 2021-09-29 NOTE — Progress Notes (Signed)
  Subjective:  Patient ID: Jacob Ponce, male    DOB: 10-Mar-1940,   MRN: [id number]  Chief Complaint  Patient presents with   Nail Problem     Diabetic  foot care    81 y.o. male presents for concern of thickened elongated and painful nails that are difficult to trim. Requesting to have them trimmed today. Relates burning and tingling in their feet. Patient is diabetic and last A1c was  Lab Results  Component Value Date   HGBA1C 6.9 (H) 09/18/2021   .   PCP:  Joaquim Nam, MD    . Denies any other pedal complaints. Denies n/v/f/c.   Past Medical History:  Diagnosis Date   BPH (benign prostatic hyperplasia)    Diabetes mellitus    Type II, controlled   Fracture 2011   Right proximal humerus, resolved w/o deficit   HBV (hepatitis B virus) infection    History of cardiac cath 1999   and Tilt Table Test, both negative   Hyperlipidemia    Hypertension    Impotence of organic origin    Intertrigo 08/10/2020   Keloid 08/04/2021   On pre-exposure prophylaxis for HIV 02/10/2020   Special screening for malignant neoplasms of other sites     Objective:  Physical Exam: Vascular: DP/PT pulses 2/4 bilateral. CFT <3 seconds. Absent hair growth on digits. Edema noted to bilateral lower extremities. Xerosis noted bilaterally.  Skin. No lacerations or abrasions bilateral feet. Nails 1-5 bilateral  are thickened discolored and elongated with subungual debris.  Musculoskeletal: MMT 5/5 bilateral lower extremities in DF, PF, Inversion and Eversion. Deceased ROM in DF of ankle joint.  Neurological: Sensation intact to light touch. Protective sensation diminished bilateral.    Assessment:   1. Pain due to onychomycosis of nail   2. Type 2 diabetes mellitus without complication, unspecified whether long term insulin use (HCC)      Plan:  Patient was evaluated and treated and all questions answered. -Discussed and educated patient on diabetic foot care, especially with  regards to the  vascular, neurological and musculoskeletal systems.  -Stressed the importance of good glycemic control and the detriment of not  controlling glucose levels in relation to the foot. -Discussed supportive shoes at all times and checking feet regularly.  -Mechanically debrided all nails 1-5 bilateral using sterile nail nipper and filed with dremel without incident  -Answered all patient questions -Patient to return  in 3 months for at risk foot care -Patient advised to call the office if any problems or questions arise in the meantime.   Louann Sjogren, DPM

## 2021-10-02 NOTE — Telephone Encounter (Signed)
Pt called requesting testing for STI and to complete labs. Pt is concerned he may have crabs. He is scheduled 8/30 @ 10:45.

## 2021-10-04 ENCOUNTER — Ambulatory Visit: Payer: Medicare Other | Admitting: Family

## 2021-10-04 ENCOUNTER — Encounter: Payer: Self-pay | Admitting: Family

## 2021-10-04 ENCOUNTER — Ambulatory Visit: Payer: Medicare Other | Admitting: Pharmacist

## 2021-10-04 ENCOUNTER — Other Ambulatory Visit: Payer: Self-pay

## 2021-10-04 VITALS — BP 133/83 | HR 91 | Temp 97.5°F | Wt 179.0 lb

## 2021-10-04 DIAGNOSIS — R21 Rash and other nonspecific skin eruption: Secondary | ICD-10-CM | POA: Insufficient documentation

## 2021-10-04 MED ORDER — PERMETHRIN 1 % EX LOTN: 1.0000 | TOPICAL_LOTION | Freq: Once | CUTANEOUS | 0 refills | Status: AC

## 2021-10-04 NOTE — Patient Instructions (Signed)
Nice to see you.  We will send in a prescription.  Use 1 application and leave in place for 10 minutes and then rinse. Can repeat in 1 week if needed.   Bendryl as needed for itching.  Have a great day and stay safe!

## 2021-10-04 NOTE — Assessment & Plan Note (Signed)
Mr. Snelgrove has an acute onset rash in his bilateral groin and informed he had given someone crabs. No other symptoms at present. Does not appear to be a fungal infection. Will treat with permetharin 1% lotion to be applied once and rinsed after 10 minutes with instructions to repeat in 1 week if needed. Advised to follow up if symptoms worsen or do not improve.

## 2021-10-04 NOTE — Progress Notes (Signed)
Subjective:    Patient ID: Jacob Ponce, male    DOB: Jun 18, 1940, 81 y.o.   MRN: [id number]  Chief Complaint  Patient presents with   Rash    HPI:  Jacob Ponce is a 81 y.o. male with chronic Hepatitis B  last seen by Dr. Daiva Eves on 08/04/21 with good adherence and tolerance to Truvada presents today for an acute office visit.   Jacob Ponce has been having increased levels of itching located in his bilateral groins that has been going on for about 3 weeks that has been refractory to the use Blue Star ointment. Course of symptoms has stayed about the same since initial onset. Informed that he had given someone "crabs."  Believes it may be related to a recent stay in a [employer] in [place], [place] that was not clean. Does not have any other symptoms, rashes or lesions.    Allergies  Allergen Reactions   Alcohol Other (See Comments)   Cat Hair Extract Other (See Comments)   Pollen Extract Other (See Comments)   Tamsulosin     Would avoid based on ophthalmology recommendation.      Outpatient Medications Prior to Visit  Medication Sig Dispense Refill   albuterol (VENTOLIN HFA) 108 (90 Base) MCG/ACT inhaler Inhale 2 puffs into the lungs every 6 (six) hours as needed for wheezing or shortness of breath. 8 g 0   alfuzosin (UROXATRAL) 10 MG 24 hr tablet Take 10 mg by mouth daily.     aspirin 81 MG tablet Take 81 mg by mouth daily.     Cholecalciferol (VITAMIN D3) 50 MCG (2000 UT) capsule Take 1 capsule (2,000 Units total) by mouth daily.     doxazosin (CARDURA) 8 MG tablet Take 1 tablet (8 mg total) by mouth at bedtime. 90 tablet 3   emtricitabine-tenofovir AF (DESCOVY) 200-25 MG tablet Take 1 tablet by mouth daily. 30 tablet 2   glucose blood (ONETOUCH ULTRA) test strip USE TO CHECK BLOOD SUGAR  DAILY. E11.9. 100 strip 3   Lancets (ONETOUCH ULTRASOFT) lancets USE TO CHECK BLOOD SUGAR  DAILY 100 each 3   loratadine (CLARITIN) 10 MG tablet Take 10 mg by mouth daily as needed for allergies.      metFORMIN (GLUCOPHAGE) 500 MG tablet 2 tablets in the morning and 1 tablet in the evening.  With meals. 270 tablet 3   Multiple Vitamins-Minerals (PRESERVISION AREDS 2 PO) Take by mouth in the morning and at bedtime.     pioglitazone (ACTOS) 30 MG tablet Take 0.5 tablets (15 mg total) by mouth daily. 45 tablet 3   simvastatin (ZOCOR) 40 MG tablet Take 1 tablet (40 mg total) by mouth at bedtime. 90 tablet 3   No facility-administered medications prior to visit.     Past Medical History:  Diagnosis Date   BPH (benign prostatic hyperplasia)    Diabetes mellitus    Type II, controlled   Fracture 2011   Right proximal humerus, resolved w/o deficit   HBV (hepatitis B virus) infection    History of cardiac cath 1999   and Tilt Table Test, both negative   Hyperlipidemia    Hypertension    Impotence of organic origin    Intertrigo 08/10/2020   Keloid 08/04/2021   On pre-exposure prophylaxis for HIV 02/10/2020   Special screening for malignant neoplasms of other sites      Past Surgical History:  Procedure Laterality Date   APPENDECTOMY     TIBIA FRACTURE SURGERY  Left, resolved without deficit   TONSILLECTOMY         Review of Systems  Constitutional:  Negative for chills, diaphoresis, fatigue and fever.  Respiratory:  Negative for cough, chest tightness, shortness of breath and wheezing.   Cardiovascular:  Negative for chest pain.  Gastrointestinal:  Negative for abdominal pain, diarrhea, nausea and vomiting.  Skin:  Positive for rash.      Objective:    BP 133/83   Pulse 91   Temp (!) 97.5 F (36.4 C) (Oral)   Wt 179 lb (81.2 kg)   SpO2 96%   BMI 27.62 kg/m  Nursing note and vital signs reviewed.  Physical Exam Exam conducted with a chaperone present.  Constitutional:      General: He is not in acute distress.    Appearance: He is well-developed.  Cardiovascular:     Rate and Rhythm: Normal rate and regular rhythm.     Heart sounds: Normal heart sounds.   Pulmonary:     Effort: Pulmonary effort is normal.     Breath sounds: Normal breath sounds.  Skin:    General: Skin is warm and dry.     Comments: Bilateral groin with mild erythema and small little lesions spread sporadically. Pubic hair free from any obvious infestation.   Neurological:     Mental Status: He is alert and oriented to person, place, and time.  Psychiatric:        Behavior: Behavior normal.        Thought Content: Thought content normal.        Judgment: Judgment normal.         08/04/2021   11:36 AM 04/24/2021   10:35 AM 03/01/2021    3:44 PM 09/16/2020   10:21 AM 08/10/2020    9:35 AM  Depression screen PHQ 2/9  Decreased Interest 0 0 0 0 0  Down, Depressed, Hopeless 0 0 0 0 0  PHQ - 2 Score 0 0 0 0 0       Assessment & Plan:    Patient Active Problem List   Diagnosis Date Noted   Rash 10/04/2021   Keloid 08/04/2021   Lower respiratory infection 07/06/2021   Acute cough 07/06/2021   Adventitious breath sounds 07/06/2021   Lightheadedness 07/06/2021   Wheezing 07/06/2021   Impacted cerumen of right ear 07/06/2021   Shortness of breath 07/06/2021   Vitamin D deficiency 05/18/2021   Osteoporosis 02/15/2021   Personal history of colonic polyps 02/14/2021   Compression fracture of T3 vertebra with routine healing 10/25/2020   Closed fracture of fifth metacarpal bone, unspecified fracture morphology, initial encounter 09/24/2020   Intertrigo 08/10/2020   On pre-exposure prophylaxis for HIV 02/10/2020   Pruritic rash 10/12/2019   Sore throat 10/12/2019   Pneumonia 09/21/2018   Constipation 09/21/2018   HBV (hepatitis B virus) infection 09/10/2018   Dysuria 06/30/2018   Hyperopia with presbyopia of both eyes 07/22/2017   Healthcare maintenance 09/13/2016   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 No other symptoms at present. Does not appear to be a fungal infection. Will treat with permetharin 1% lotion to be applied once and rinsed after 10 minutes with instructions to repeat in 1 week if needed. Advised to follow up if symptoms worsen or do not improve.         I am having Jacob A. Pew "Ned" start on permethrin. I am also having him maintain his aspirin, loratadine, Multiple Vitamins-Minerals (PRESERVISION AREDS 2 PO), onetouch ultrasoft, simvastatin, pioglitazone, metFORMIN, OneTouch Ultra, doxazosin, alfuzosin, Vitamin D3, albuterol, and Descovy.   Meds ordered this encounter  Medications   permethrin (ELIMITE) 1 % lotion    Sig: Apply 1 Application topically once for 1 dose. Apply to affected area and rinse after 10 min; repeat in 1 week if needed    Dispense:  59 mL    Refill:  0    Order Specific Question:   Supervising Provider    Answer:   Judyann Munson [[id number]]     Follow-up: Return if symptoms worsen or fail to improve.   Marcos Eke, MSN, FNP-C Nurse Practitioner [facility] Main number: [phone]

## 2021-10-05 ENCOUNTER — Other Ambulatory Visit: Payer: Self-pay | Admitting: Family

## 2021-10-05 ENCOUNTER — Other Ambulatory Visit: Payer: Self-pay

## 2021-10-05 MED ORDER — PERMETHRIN 1 % EX LOTN
1.0000 | TOPICAL_LOTION | Freq: Once | CUTANEOUS | 0 refills | Status: AC
Start: 2021-10-05 — End: 2021-10-05

## 2021-10-12 ENCOUNTER — Other Ambulatory Visit (HOSPITAL_COMMUNITY): Payer: Self-pay

## 2021-10-15 ENCOUNTER — Other Ambulatory Visit: Payer: Self-pay | Admitting: Family Medicine

## 2021-10-19 ENCOUNTER — Other Ambulatory Visit (HOSPITAL_COMMUNITY): Payer: Self-pay

## 2021-10-23 NOTE — Progress Notes (Signed)
[facility] Quality Team Note  Name: Jacob Ponce Date of Birth: 07-22-1940 MRN: [id number] Date: 10/23/2021  [facility] Quality Team has reviewed this patient's chart, please see recommendations below:  [facility] Quality Other; Pt has open quality gap for KED, needs Micro/Creat urine test and EGFR blood test done in 2023 to close this.

## 2021-11-07 ENCOUNTER — Other Ambulatory Visit (HOSPITAL_COMMUNITY)
Admission: RE | Admit: 2021-11-07 | Discharge: 2021-11-07 | Disposition: A | Discharge status: Home / Self Care | Payer: Medicare Other | Source: Ambulatory Visit | Attending: Infectious Disease | Admitting: Infectious Disease

## 2021-11-07 ENCOUNTER — Telehealth: Payer: Self-pay

## 2021-11-07 ENCOUNTER — Encounter: Payer: Self-pay | Admitting: Infectious Disease

## 2021-11-07 ENCOUNTER — Other Ambulatory Visit: Payer: Self-pay

## 2021-11-07 ENCOUNTER — Other Ambulatory Visit (HOSPITAL_COMMUNITY): Payer: Self-pay

## 2021-11-07 ENCOUNTER — Ambulatory Visit: Payer: Medicare Other | Admitting: Infectious Disease

## 2021-11-07 VITALS — BP 153/85 | HR 61 | Temp 97.3°F | Wt 179.2 lb

## 2021-11-07 DIAGNOSIS — Z79899 Other long term (current) drug therapy: Secondary | ICD-10-CM

## 2021-11-07 DIAGNOSIS — B181 Chronic viral hepatitis B without delta-agent: Secondary | ICD-10-CM

## 2021-11-07 DIAGNOSIS — B853 Phthiriasis: Secondary | ICD-10-CM | POA: Diagnosis not present

## 2021-11-07 DIAGNOSIS — Z113 Encounter for screening for infections with a predominantly sexual mode of transmission: Secondary | ICD-10-CM | POA: Diagnosis present

## 2021-11-07 MED ORDER — MALATHION 0.5 % EX LOTN
TOPICAL_LOTION | CUTANEOUS | 1 refills | Status: DC
  Filled 2021-11-07: qty 59, 30d supply, fill #0
  Filled 2021-11-27: qty 59, 7d supply, fill #0

## 2021-11-07 MED ORDER — DESCOVY 200-25 MG PO TABS
1.0000 | ORAL_TABLET | Freq: Every day | ORAL | 2 refills | Status: DC
  Filled 2021-11-07 – 2021-11-27 (×2): qty 30, 30d supply, fill #0
  Filled 2021-12-29: qty 30, 30d supply, fill #1
  Filled 2022-02-08 – 2022-02-20 (×3): qty 30, 30d supply, fill #2

## 2021-11-07 MED ORDER — IVERMECTIN 3 MG PO TABS
ORAL_TABLET | ORAL | 0 refills | Status: DC
  Filled 2021-11-07 – 2021-11-27 (×2): qty 14, 14d supply, fill #0

## 2021-11-07 NOTE — Patient Instructions (Signed)
I have sent in ivermectin to [facility]. You should take 7 tablets on day one and then repeat in 2 weeks  You can also try malathion 0.5% lotion, apply and leave on for 12 hours and wash off and can repeat in one week

## 2021-11-07 NOTE — Telephone Encounter (Signed)
[facility] Patient Advocate Encounter  Prior Authorization for Ivermectin has been approved.    PA# [id number] Effective dates: 11/07/21 through 12/08/21  Patients co-pay is $4.15.   [facility] will continue to follow.  Ileene Patrick, [facility] Patient [facility] for Infectious Disease Phone: [phone] Fax:  [phone]

## 2021-11-07 NOTE — Telephone Encounter (Signed)
[facility] Patient Advocate Encounter   Received notification from [employer] Medicare D that prior authorization for Ivermectin is required.   PA submitted on 11/07/21 Key [id number] Status is pending    [facility] will continue to follow.   Ileene Patrick, [facility] Patient [facility] for Infectious Disease Phone: [phone] Fax:  [phone]

## 2021-11-07 NOTE — Progress Notes (Signed)
Subjective:  Chief complaint: persistent irritation of skin in pubic area from what he believes is pubic lice   Patient ID: Jacob Ponce, male    DOB: Aug 10, 1940, 81 y.o.   MRN: [id number]  HPI  Jacob Ponce is a 81 year old man whom I have been treating chronic hepatitis B without hepatic coma, or delta agent, with DESCOVY which we are also using for HIV PrEP.  He had partner who developed pubic lice and sought treatment and was seen by Marcos Eke. He has taken 1% permethrin weekly for 6 weeks but is not better. He has tried to wash all of his clothes and linens but his washer does not go to temp above 130 degrees    Past Medical History:  Diagnosis Date   BPH (benign prostatic hyperplasia)    Diabetes mellitus    Type II, controlled   Fracture 2011   Right proximal humerus, resolved w/o deficit   HBV (hepatitis B virus) infection    History of cardiac cath 1999   and Tilt Table Test, both negative   Hyperlipidemia    Hypertension    Impotence of organic origin    Intertrigo 08/10/2020   Keloid 08/04/2021   On pre-exposure prophylaxis for HIV 02/10/2020   Special screening for malignant neoplasms of other sites     Past Surgical History:  Procedure Laterality Date   APPENDECTOMY     TIBIA FRACTURE SURGERY     Left, resolved without deficit   TONSILLECTOMY      Family History  Problem Relation Age of Onset   Cancer Father        colon   Seizures Father        after head injury   Colon cancer Father    Colon cancer Mother    Arthritis Brother    Prostate cancer Neg Hx       Social History   Socioeconomic History   Marital status: Married    Spouse name: Not on file   Number of children: 4   Years of education: Not on file   Highest education level: Not on file  Occupational History   Occupation: Art gallery manager at [employer]    Employer: [employer]   Occupation: Pilot  Tobacco Use   Smoking status: Former    Types: Cigarettes    Quit date: 02/05/1985    Years since  quitting: 36.7   Smokeless tobacco: Never   Tobacco comments:    Smoked from 1966 to 1987  Substance and Sexual Activity   Alcohol use: No    Alcohol/week: 0.0 standard drinks of alcohol   Drug use: No   Sexual activity: Never    Comment: declined condoms  Other Topics Concern   Not on file  Social History Narrative   Undergrad and Masters at [employer].   Second marriage since 1987   4 children, 3 step-children   Flew small aircraft- retired from flying 2019   Retired 2023   Social Determinants of [employer]: Low Risk  (04/24/2021)   Overall Financial Resource Strain (CARDIA)    Difficulty of Paying Living Expenses: Not hard at all  Food Insecurity: No Food Insecurity (04/24/2021)   Hunger Vital Sign    Worried About Running Out of Food in the Last Year: Never true    Ran Out of Food in the Last Year: Never true  Transportation Needs: No Transportation Needs (04/24/2021)   PRAPARE - Transportation  Lack of Transportation (Medical): No    Lack of Transportation (Non-Medical): No  Physical Activity: Sufficiently Active (04/24/2021)   Exercise Vital Sign    Days of Exercise per Week: 7 days    Minutes of Exercise per Session: 40 min  Stress: No Stress Concern Present (04/24/2021)   [employer] of Occupational Health - Occupational Stress Questionnaire    Feeling of Stress : Not at all  Social Connections: Socially Integrated (04/24/2021)   Social Connection and Isolation Panel [NHANES]    Frequency of Communication with Friends and Family: Twice a week    Frequency of Social Gatherings with Friends and Family: Twice a week    Attends Religious Services: More than 4 times per year    Active Member of [employer] or Organizations: Yes    Attends Engineer, structural: More than 4 times per year    Marital Status: Married    Allergies  Allergen Reactions   Alcohol Other (See Comments)   Cat Hair Extract Other (See Comments)   Pollen Extract  Other (See Comments)   Tamsulosin     Would avoid based on ophthalmology recommendation.     Current Outpatient Medications:    albuterol (VENTOLIN HFA) 108 (90 Base) MCG/ACT inhaler, Inhale 2 puffs into the lungs every 6 (six) hours as needed for wheezing or shortness of breath., Disp: 8 g, Rfl: 0   alfuzosin (UROXATRAL) 10 MG 24 hr tablet, Take 10 mg by mouth daily., Disp: , Rfl:    aspirin 81 MG tablet, Take 81 mg by mouth daily., Disp: , Rfl:    Cholecalciferol (VITAMIN D3) 50 MCG (2000 UT) capsule, Take 1 capsule (2,000 Units total) by mouth daily., Disp: , Rfl:    doxazosin (CARDURA) 8 MG tablet, Take 1 tablet (8 mg total) by mouth at bedtime., Disp: 90 tablet, Rfl: 3   glucose blood (ONETOUCH ULTRA) test strip, USE TO CHECK BLOOD SUGAR  DAILY. E11.9., Disp: 100 strip, Rfl: 3   ivermectin (STROMECTOL) 3 MG TABS tablet, Take 7 tablets on day one and repeat in 14 days, Disp: 14 tablet, Rfl: 0   Lancets (ONETOUCH ULTRASOFT) lancets, USE TO CHECK BLOOD SUGAR  DAILY, Disp: 100 each, Rfl: 3   loratadine (CLARITIN) 10 MG tablet, Take 10 mg by mouth daily as needed for allergies., Disp: , Rfl:    malathion (OVIDE) 0.5 % lotion, Sprinkle lotion on dry hair and effective area, rub gently until area thoroughly moistened. Allow to dry naturally and wash off after 12 hours, can repeat in one week, Disp: 59 mL, Rfl: 1   metFORMIN (GLUCOPHAGE) 500 MG tablet, TAKE 2 TABLETS BY MOUTH IN THE  MORNING AND 1 TABLET BY MOUTH IN THE EVENING WITH MEALS, Disp: 300 tablet, Rfl: 2   Multiple Vitamins-Minerals (PRESERVISION AREDS 2 PO), Take by mouth in the morning and at bedtime., Disp: , Rfl:    pioglitazone (ACTOS) 30 MG tablet, Take 0.5 tablets (15 mg total) by mouth daily., Disp: 45 tablet, Rfl: 3   simvastatin (ZOCOR) 40 MG tablet, Take 1 tablet (40 mg total) by mouth at bedtime., Disp: 90 tablet, Rfl: 3   emtricitabine-tenofovir AF (DESCOVY) 200-25 MG tablet, Take 1 tablet by mouth daily., Disp: 30 tablet,  Rfl: 2    Review of Systems  Constitutional:  Negative for activity change, appetite change, chills, diaphoresis, fatigue, fever and unexpected weight change.  HENT:  Negative for congestion, rhinorrhea, sinus pressure, sneezing, sore throat and trouble swallowing.   Eyes:  Negative for photophobia and visual disturbance.  Respiratory:  Negative for cough, chest tightness, shortness of breath, wheezing and stridor.   Cardiovascular:  Negative for chest pain, palpitations and leg swelling.  Gastrointestinal:  Negative for abdominal distention, abdominal pain, anal bleeding, blood in stool, constipation, diarrhea, nausea and vomiting.  Genitourinary:  Negative for difficulty urinating, dysuria, flank pain and hematuria.  Musculoskeletal:  Negative for arthralgias, back pain, gait problem, joint swelling and myalgias.  Skin:  Positive for rash. Negative for color change, pallor and wound.  Neurological:  Negative for dizziness, tremors, weakness and light-headedness.  Hematological:  Negative for adenopathy. Does not bruise/bleed easily.  Psychiatric/Behavioral:  Negative for agitation, behavioral problems, confusion, decreased concentration, dysphoric mood and sleep disturbance.        Objective:   Physical Exam Exam conducted with a chaperone present.  Constitutional:      Appearance: He is well-developed.  HENT:     Head: Normocephalic and atraumatic.  Eyes:     Conjunctiva/sclera: Conjunctivae normal.  Cardiovascular:     Rate and Rhythm: Normal rate and regular rhythm.  Pulmonary:     Effort: Pulmonary effort is normal. No respiratory distress.     Breath sounds: No wheezing.  Abdominal:     General: There is no distension.     Palpations: Abdomen is soft.  Genitourinary:    Pubic Area: Rash present.     Penis: Circumcised.      Comments: Intense erythema Musculoskeletal:        General: No tenderness. Normal range of motion.     Cervical back: Normal range of motion and  neck supple.  Skin:    General: Skin is warm and dry.     Coloration: Skin is not pale.     Findings: No erythema or rash.  Neurological:     General: No focal deficit present.     Mental Status: He is alert and oriented to person, place, and time.  Psychiatric:        Mood and Affect: Mood normal.        Behavior: Behavior normal.        Thought Content: Thought content normal.        Judgment: Judgment normal.           Assessment & Plan:   Pubic lice:   If this is indeed what is causing his symptoms and it seems to be we will do the following  Ask him to have all of his clothes and linens dry cleaned   Take 7 tablets of ivermectin x 1 and repeat in 2 weeks  Apply malathion to body x1 and repeat in a week  Hepatitis B without hepatic coma: Reviewed his most recent labs including hep B DNA levels which have been low.  We will recheck all of the other hepatitis B labs in 6 months time we will check a CMP today have reviewed his ultrasound of the liver which does not show evidence of hepatocellular carcinoma. Continue DESCOVY prescription  HIV PrEP and screen for STIs: Screening for gonorrhea and chlamydia and urine oropharynx rectum checking syphilis rechecking HIV antibody  Continue DESCOVY prescription

## 2021-11-08 LAB — COMPLETE METABOLIC PANEL WITH GFR
AG Ratio: 2 (calc) (ref 1.0–2.5)
ALT: 21 U/L (ref 9–46)
AST: 21 U/L (ref 10–35)
Albumin: 4.3 g/dL (ref 3.6–5.1)
Alkaline phosphatase (APISO): 40 U/L (ref 35–144)
BUN: 23 mg/dL (ref 7–25)
CO2: 23 mmol/L (ref 20–32)
Calcium: 10 mg/dL (ref 8.6–10.3)
Chloride: 103 mmol/L (ref 98–110)
Creat: 1.04 mg/dL (ref 0.70–1.28)
Globulin: 2.2 g/dL (calc) (ref 1.9–3.7)
Glucose, Bld: 217 mg/dL — ABNORMAL HIGH (ref 65–99)
Potassium: 4.1 mmol/L (ref 3.5–5.3)
Sodium: 137 mmol/L (ref 135–146)
Total Bilirubin: 0.6 mg/dL (ref 0.2–1.2)
Total Protein: 6.5 g/dL (ref 6.1–8.1)
eGFR: 73 mL/min/{1.73_m2} (ref >=60)

## 2021-11-08 LAB — CYTOLOGY, (ORAL, ANAL, URETHRAL) ANCILLARY ONLY
Chlamydia: NEGATIVE
Chlamydia: NEGATIVE
Comment: NEGATIVE
Comment: NEGATIVE
Comment: NORMAL
Comment: NORMAL
Neisseria Gonorrhea: NEGATIVE
Neisseria Gonorrhea: NEGATIVE

## 2021-11-08 LAB — RPR: RPR Ser Ql: NONREACTIVE

## 2021-11-08 LAB — URINE CYTOLOGY ANCILLARY ONLY
Chlamydia: NEGATIVE
Comment: NEGATIVE
Comment: NORMAL
Neisseria Gonorrhea: NEGATIVE

## 2021-11-08 LAB — HIV ANTIBODY (ROUTINE TESTING W REFLEX): HIV 1&2 Ab, 4th Generation: NONREACTIVE

## 2021-11-10 ENCOUNTER — Other Ambulatory Visit (HOSPITAL_COMMUNITY): Payer: Self-pay

## 2021-11-16 ENCOUNTER — Encounter: Payer: Self-pay | Admitting: Family Medicine

## 2021-11-16 DIAGNOSIS — N401 Enlarged prostate with lower urinary tract symptoms: Secondary | ICD-10-CM

## 2021-11-16 MED ORDER — DOXAZOSIN MESYLATE 8 MG PO TABS: 8.0000 mg | ORAL_TABLET | Freq: Every day | ORAL | 3 refills | Status: DC

## 2021-11-23 ENCOUNTER — Other Ambulatory Visit (HOSPITAL_COMMUNITY): Payer: Self-pay

## 2021-11-25 ENCOUNTER — Other Ambulatory Visit (HOSPITAL_COMMUNITY): Payer: Self-pay

## 2021-11-26 ENCOUNTER — Other Ambulatory Visit: Payer: Self-pay | Admitting: Family Medicine

## 2021-11-27 ENCOUNTER — Other Ambulatory Visit (HOSPITAL_COMMUNITY): Payer: Self-pay

## 2021-12-01 ENCOUNTER — Encounter: Payer: Self-pay | Admitting: Infectious Disease

## 2021-12-18 ENCOUNTER — Other Ambulatory Visit (HOSPITAL_COMMUNITY): Payer: Self-pay

## 2021-12-22 ENCOUNTER — Other Ambulatory Visit: Payer: Self-pay | Admitting: Family Medicine

## 2021-12-26 DIAGNOSIS — E119 Type 2 diabetes mellitus without complications: Secondary | ICD-10-CM | POA: Diagnosis not present

## 2021-12-26 DIAGNOSIS — Z7984 Long term (current) use of oral hypoglycemic drugs: Secondary | ICD-10-CM | POA: Diagnosis not present

## 2021-12-26 DIAGNOSIS — H43391 Other vitreous opacities, right eye: Secondary | ICD-10-CM | POA: Diagnosis not present

## 2021-12-26 DIAGNOSIS — H16223 Keratoconjunctivitis sicca, not specified as Sjogren's, bilateral: Secondary | ICD-10-CM | POA: Diagnosis not present

## 2021-12-26 DIAGNOSIS — H353132 Nonexudative age-related macular degeneration, bilateral, intermediate dry stage: Secondary | ICD-10-CM | POA: Diagnosis not present

## 2021-12-26 DIAGNOSIS — H02831 Dermatochalasis of right upper eyelid: Secondary | ICD-10-CM | POA: Diagnosis not present

## 2021-12-26 DIAGNOSIS — H524 Presbyopia: Secondary | ICD-10-CM | POA: Diagnosis not present

## 2021-12-26 DIAGNOSIS — H3589 Other specified retinal disorders: Secondary | ICD-10-CM | POA: Diagnosis not present

## 2021-12-26 DIAGNOSIS — H02834 Dermatochalasis of left upper eyelid: Secondary | ICD-10-CM | POA: Diagnosis not present

## 2021-12-26 DIAGNOSIS — H5203 Hypermetropia, bilateral: Secondary | ICD-10-CM | POA: Diagnosis not present

## 2021-12-26 DIAGNOSIS — Z83511 Family history of glaucoma: Secondary | ICD-10-CM | POA: Diagnosis not present

## 2021-12-26 DIAGNOSIS — H2513 Age-related nuclear cataract, bilateral: Secondary | ICD-10-CM | POA: Diagnosis not present

## 2021-12-26 LAB — HM DIABETES EYE EXAM

## 2021-12-29 ENCOUNTER — Other Ambulatory Visit (HOSPITAL_COMMUNITY): Payer: Self-pay

## 2022-01-05 ENCOUNTER — Other Ambulatory Visit (HOSPITAL_COMMUNITY): Payer: Self-pay

## 2022-01-09 NOTE — Progress Notes (Signed)
[facility] Quality Team Note  Name: Jacob Ponce Date of Birth: 22-Mar-1940 MRN: [id number] Date: 01/09/2022  [facility] Quality Team has reviewed this patient's chart, please see recommendations below:  [facility] Quality Other; (KED GAP- KIDNEY HEALTH EVALUATION. PATIENT NEEDS URINE MICROALBUMIN/CREATININE RATIO BEFORE END OF YEAR FOR GAP CLOSURE.)

## 2022-01-13 ENCOUNTER — Other Ambulatory Visit: Payer: Self-pay | Admitting: Family Medicine

## 2022-01-30 ENCOUNTER — Other Ambulatory Visit (HOSPITAL_COMMUNITY): Payer: Self-pay

## 2022-02-01 ENCOUNTER — Other Ambulatory Visit: Payer: Self-pay

## 2022-02-04 ENCOUNTER — Other Ambulatory Visit: Payer: Self-pay | Admitting: Family Medicine

## 2022-02-04 DIAGNOSIS — N401 Enlarged prostate with lower urinary tract symptoms: Secondary | ICD-10-CM

## 2022-02-08 ENCOUNTER — Other Ambulatory Visit (HOSPITAL_COMMUNITY): Payer: Self-pay

## 2022-02-08 ENCOUNTER — Other Ambulatory Visit: Payer: Self-pay

## 2022-02-11 ENCOUNTER — Other Ambulatory Visit: Payer: Self-pay | Admitting: Family Medicine

## 2022-02-11 DIAGNOSIS — N401 Enlarged prostate with lower urinary tract symptoms: Secondary | ICD-10-CM

## 2022-02-17 ENCOUNTER — Other Ambulatory Visit: Payer: Self-pay

## 2022-02-19 ENCOUNTER — Other Ambulatory Visit (HOSPITAL_COMMUNITY): Payer: Self-pay

## 2022-02-19 ENCOUNTER — Telehealth: Payer: Self-pay

## 2022-02-19 NOTE — Telephone Encounter (Signed)
[place] reached out about patient's Good days coverage. It expired 02/04/2022. I called to see what I could do to possibly re-enroll patient, but Good days was closed today due to [facility] holiday.

## 2022-02-20 ENCOUNTER — Telehealth: Payer: Self-pay

## 2022-02-20 ENCOUNTER — Other Ambulatory Visit (HOSPITAL_COMMUNITY): Payer: Self-pay

## 2022-02-20 NOTE — Telephone Encounter (Signed)
[facility] Patient Advocate Encounter   I was successful in securing patient a $ 10,000.00 grant from [employer] to provide copayment coverage for Descovy.  The patient's out of pocket cost will be $0.00 monthly.     I have spoken with the patient.    The billing information is as follows and has been shared with [facility].         Dates of Eligibility: 02/20/22 through 02/05/23  Patient knows to call the office with questions or concerns.  Ileene Patrick, [facility] Patient [facility] for Infectious Disease Phone: [phone] Fax:  [phone]

## 2022-03-13 ENCOUNTER — Other Ambulatory Visit: Payer: Self-pay | Admitting: Infectious Disease

## 2022-03-13 ENCOUNTER — Other Ambulatory Visit (HOSPITAL_COMMUNITY): Payer: Self-pay

## 2022-03-13 DIAGNOSIS — B181 Chronic viral hepatitis B without delta-agent: Secondary | ICD-10-CM

## 2022-03-13 DIAGNOSIS — Z79899 Other long term (current) drug therapy: Secondary | ICD-10-CM

## 2022-03-13 MED ORDER — DESCOVY 200-25 MG PO TABS
1.0000 | ORAL_TABLET | Freq: Every day | ORAL | 0 refills | Status: DC
  Filled 2022-03-13: qty 30, 30d supply, fill #0

## 2022-03-16 ENCOUNTER — Other Ambulatory Visit (HOSPITAL_COMMUNITY): Payer: Self-pay

## 2022-04-05 ENCOUNTER — Other Ambulatory Visit (HOSPITAL_COMMUNITY): Payer: Self-pay

## 2022-04-09 ENCOUNTER — Other Ambulatory Visit (HOSPITAL_COMMUNITY): Payer: Self-pay

## 2022-04-11 ENCOUNTER — Other Ambulatory Visit: Payer: Self-pay

## 2022-04-11 ENCOUNTER — Other Ambulatory Visit: Payer: Medicare Other

## 2022-04-11 DIAGNOSIS — B20 Human immunodeficiency virus [HIV] disease: Secondary | ICD-10-CM

## 2022-04-11 DIAGNOSIS — Z113 Encounter for screening for infections with a predominantly sexual mode of transmission: Secondary | ICD-10-CM

## 2022-04-12 ENCOUNTER — Other Ambulatory Visit (HOSPITAL_COMMUNITY): Payer: Self-pay

## 2022-04-12 LAB — CBC WITH DIFFERENTIAL/PLATELET
Absolute Monocytes: 529 cells/uL (ref 200–950)
Basophils Absolute: 32 cells/uL (ref 0–200)
Basophils Relative: 0.5 %
Eosinophils Absolute: 63 cells/uL (ref 15–500)
Eosinophils Relative: 1 %
HCT: 43.2 % (ref 38.5–50.0)
Hemoglobin: 14.9 g/dL (ref 13.2–17.1)
Lymphs Abs: 1077 cells/uL (ref 850–3900)
MCH: 31.4 pg (ref 27.0–33.0)
MCHC: 34.5 g/dL (ref 32.0–36.0)
MCV: 91.1 fL (ref 80.0–100.0)
MPV: 10.4 fL (ref 7.5–12.5)
Monocytes Relative: 8.4 %
Neutro Abs: 4599 cells/uL (ref 1500–7800)
Neutrophils Relative %: 73 %
Platelets: 252 10*3/uL (ref 140–400)
RBC: 4.74 10*6/uL (ref 4.20–5.80)
RDW: 12.7 % (ref 11.0–15.0)
Total Lymphocyte: 17.1 %
WBC: 6.3 10*3/uL (ref 3.8–10.8)

## 2022-04-12 LAB — COMPLETE METABOLIC PANEL WITH GFR
AG Ratio: 1.9 (calc) (ref 1.0–2.5)
ALT: 16 U/L (ref 9–46)
AST: 16 U/L (ref 10–35)
Albumin: 4.1 g/dL (ref 3.6–5.1)
Alkaline phosphatase (APISO): 42 U/L (ref 35–144)
BUN: 24 mg/dL (ref 7–25)
CO2: 24 mmol/L (ref 20–32)
Calcium: 9.8 mg/dL (ref 8.6–10.3)
Chloride: 102 mmol/L (ref 98–110)
Creat: 0.98 mg/dL (ref 0.70–1.22)
Globulin: 2.2 g/dL (calc) (ref 1.9–3.7)
Glucose, Bld: 322 mg/dL — ABNORMAL HIGH (ref 65–99)
Potassium: 4 mmol/L (ref 3.5–5.3)
Sodium: 138 mmol/L (ref 135–146)
Total Bilirubin: 0.7 mg/dL (ref 0.2–1.2)
Total Protein: 6.3 g/dL (ref 6.1–8.1)
eGFR: 78 mL/min/{1.73_m2} (ref >=60)

## 2022-04-12 LAB — RPR: RPR Ser Ql: NONREACTIVE

## 2022-04-27 ENCOUNTER — Other Ambulatory Visit (HOSPITAL_COMMUNITY): Payer: Self-pay

## 2022-04-27 ENCOUNTER — Encounter: Payer: Self-pay | Admitting: Infectious Disease

## 2022-04-30 ENCOUNTER — Other Ambulatory Visit: Payer: Self-pay

## 2022-04-30 ENCOUNTER — Other Ambulatory Visit (HOSPITAL_COMMUNITY): Payer: Self-pay

## 2022-04-30 DIAGNOSIS — B181 Chronic viral hepatitis B without delta-agent: Secondary | ICD-10-CM

## 2022-04-30 DIAGNOSIS — Z79899 Other long term (current) drug therapy: Secondary | ICD-10-CM

## 2022-04-30 MED ORDER — DESCOVY 200-25 MG PO TABS
1.0000 | ORAL_TABLET | Freq: Every day | ORAL | 0 refills | Status: DC
  Filled 2022-04-30: qty 30, 30d supply, fill #0

## 2022-05-09 ENCOUNTER — Other Ambulatory Visit: Payer: Self-pay

## 2022-05-09 ENCOUNTER — Ambulatory Visit: Payer: Medicare Other | Admitting: Infectious Disease

## 2022-05-09 ENCOUNTER — Other Ambulatory Visit (HOSPITAL_COMMUNITY): Payer: Self-pay

## 2022-05-09 ENCOUNTER — Other Ambulatory Visit (HOSPITAL_COMMUNITY)
Admission: RE | Admit: 2022-05-09 | Discharge: 2022-05-09 | Disposition: A | Discharge status: Home / Self Care | Payer: Medicare Other | Source: Ambulatory Visit | Attending: Infectious Disease | Admitting: Infectious Disease

## 2022-05-09 ENCOUNTER — Encounter: Payer: Self-pay | Admitting: Infectious Disease

## 2022-05-09 VITALS — BP 138/77 | HR 81 | Temp 97.4°F | Wt 181.0 lb

## 2022-05-09 DIAGNOSIS — S62308A Unspecified fracture of other metacarpal bone, initial encounter for closed fracture: Secondary | ICD-10-CM

## 2022-05-09 DIAGNOSIS — M62838 Other muscle spasm: Secondary | ICD-10-CM

## 2022-05-09 DIAGNOSIS — Z79899 Other long term (current) drug therapy: Secondary | ICD-10-CM | POA: Insufficient documentation

## 2022-05-09 DIAGNOSIS — B181 Chronic viral hepatitis B without delta-agent: Secondary | ICD-10-CM | POA: Insufficient documentation

## 2022-05-09 MED ORDER — DOXYCYCLINE HYCLATE 100 MG PO TABS
200.0000 mg | ORAL_TABLET | ORAL | 3 refills | Status: DC
  Filled 2022-05-09: qty 60, fill #0
  Filled 2022-06-19: qty 60, 30d supply, fill #0

## 2022-05-09 MED ORDER — DESCOVY 200-25 MG PO TABS
1.0000 | ORAL_TABLET | Freq: Every day | ORAL | 11 refills | Status: DC
  Filled 2022-05-09 – 2022-05-22 (×2): qty 30, 30d supply, fill #0
  Filled 2022-06-19: qty 30, 30d supply, fill #1
  Filled 2022-07-25: qty 30, 30d supply, fill #2

## 2022-05-09 NOTE — Addendum Note (Signed)
Addended by: Leatrice Jewels on: 05/09/2022 02:36 PM   Modules accepted: Orders
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
## 2022-05-10 LAB — URINE CYTOLOGY ANCILLARY ONLY
Chlamydia: NEGATIVE
Comment: NEGATIVE
Comment: NORMAL
Neisseria Gonorrhea: NEGATIVE

## 2022-05-10 LAB — CYTOLOGY, (ORAL, ANAL, URETHRAL) ANCILLARY ONLY
Chlamydia: NEGATIVE
Chlamydia: NEGATIVE
Comment: NEGATIVE
Comment: NEGATIVE
Comment: NORMAL
Comment: NORMAL
Neisseria Gonorrhea: NEGATIVE
Neisseria Gonorrhea: NEGATIVE

## 2022-05-11 LAB — COMPLETE METABOLIC PANEL WITH GFR
AG Ratio: 1.9 (calc) (ref 1.0–2.5)
ALT: 14 U/L (ref 9–46)
AST: 16 U/L (ref 10–35)
Albumin: 4.4 g/dL (ref 3.6–5.1)
Alkaline phosphatase (APISO): 46 U/L (ref 35–144)
BUN/Creatinine Ratio: 35 (calc) — ABNORMAL HIGH (ref 6–22)
BUN: 51 mg/dL — ABNORMAL HIGH (ref 7–25)
CO2: 21 mmol/L (ref 20–32)
Calcium: 10.5 mg/dL — ABNORMAL HIGH (ref 8.6–10.3)
Chloride: 102 mmol/L (ref 98–110)
Creat: 1.44 mg/dL — ABNORMAL HIGH (ref 0.70–1.22)
Globulin: 2.3 g/dL (calc) (ref 1.9–3.7)
Glucose, Bld: 220 mg/dL — ABNORMAL HIGH (ref 65–99)
Potassium: 4.2 mmol/L (ref 3.5–5.3)
Sodium: 138 mmol/L (ref 135–146)
Total Bilirubin: 0.5 mg/dL (ref 0.2–1.2)
Total Protein: 6.7 g/dL (ref 6.1–8.1)
eGFR: 49 mL/min/{1.73_m2} — ABNORMAL LOW (ref >=60)

## 2022-05-11 LAB — HEPATITIS B DNA, ULTRAQUANTITATIVE, PCR
Hepatitis B DNA: NOT DETECTED IU/mL
Hepatitis B virus DNA: NOT DETECTED Log IU/mL

## 2022-05-11 LAB — HIV-1 RNA QUANT-NO REFLEX-BLD
HIV 1 RNA Quant: NOT DETECTED Copies/mL
HIV-1 RNA Quant, Log: NOT DETECTED Log cps/mL

## 2022-05-11 LAB — RPR: RPR Ser Ql: NONREACTIVE

## 2022-05-13 ENCOUNTER — Other Ambulatory Visit: Payer: Self-pay | Admitting: Family Medicine

## 2022-05-13 DIAGNOSIS — E119 Type 2 diabetes mellitus without complications: Secondary | ICD-10-CM

## 2022-05-14 ENCOUNTER — Ambulatory Visit (HOSPITAL_COMMUNITY)
Admission: RE | Admit: 2022-05-14 | Discharge: 2022-05-14 | Disposition: A | Discharge status: Home / Self Care | Payer: Medicare Other | Source: Ambulatory Visit | Attending: Infectious Disease | Admitting: Infectious Disease

## 2022-05-14 DIAGNOSIS — B181 Chronic viral hepatitis B without delta-agent: Secondary | ICD-10-CM | POA: Insufficient documentation

## 2022-05-14 DIAGNOSIS — Z1159 Encounter for screening for other viral diseases: Secondary | ICD-10-CM | POA: Diagnosis not present

## 2022-05-14 DIAGNOSIS — Z79899 Other long term (current) drug therapy: Secondary | ICD-10-CM | POA: Diagnosis not present

## 2022-05-14 DIAGNOSIS — Z139 Encounter for screening, unspecified: Secondary | ICD-10-CM | POA: Diagnosis not present

## 2022-05-16 ENCOUNTER — Ambulatory Visit (INDEPENDENT_AMBULATORY_CARE_PROVIDER_SITE_OTHER): Payer: Medicare Other

## 2022-05-16 VITALS — Ht 67.5 in | Wt 180.0 lb

## 2022-05-16 DIAGNOSIS — Z0000 Encounter for general adult medical examination without abnormal findings: Secondary | ICD-10-CM | POA: Diagnosis not present

## 2022-05-16 NOTE — Progress Notes (Signed)
I connected with  Jacob Ponce on 05/16/22 by a audio enabled telemedicine application and verified that I am speaking with the correct person using two identifiers.  Patient Location: Home  Provider Location: Office/Clinic  I discussed the limitations of evaluation and management by telemedicine. The patient expressed understanding and agreed to proceed.  Subjective:   Jacob Ponce is a 82 y.o. male who presents for Medicare Annual/Subsequent preventive examination.  Review of Systems      Cardiac Risk Factors include: advanced age (>27men, >5 women);hypertension;diabetes mellitus     Objective:    Today's Vitals   05/16/22 1203 05/16/22 1204  Weight: 180 lb (81.6 kg)   Height: 5' 7.5" (1.715 m)   PainSc:  1    Body mass index is 27.78 kg/m.     05/16/2022   12:18 PM 04/24/2021   10:36 AM 10/07/2019   12:05 PM 09/14/2018    5:40 PM 11/07/2017   11:36 AM 09/20/2017    8:47 AM 09/12/2016    2:05 PM  Advanced Directives  Does Patient Have a Medical Advance Directive? Yes Yes Yes Yes Yes Yes Yes  Type of Estate agent of [place];Living will Healthcare Power of [place];Living will Healthcare Power of [place];Living will Healthcare Power of [place];Living will Healthcare Power of [place];Living will Healthcare Power of [place];Living will Healthcare Power of [place];Living will  Copy of Healthcare Power of Attorney in Chart? No - copy requested No - copy requested No - copy requested No - copy requested  No - copy requested No - copy requested  Would patient like information on creating a medical advance directive?    No - Patient declined       Current Medications (verified) Outpatient Encounter Medications as of 05/16/2022  Medication Sig   aspirin 81 MG tablet Take 81 mg by mouth daily.   Cholecalciferol (VITAMIN D3) 50 MCG (2000 UT) capsule Take 1 capsule (2,000 Units total) by mouth daily.   doxazosin (CARDURA) 8 MG tablet TAKE 1 TABLET BY MOUTH AT   BEDTIME   emtricitabine-tenofovir AF (DESCOVY) 200-25 MG tablet Take 1 tablet by mouth daily.   glucose blood (ONETOUCH ULTRA) test strip USE TO CHECK BLOOD SUGAR DAILY   loratadine (CLARITIN) 10 MG tablet Take 10 mg by mouth daily as needed for allergies.   metFORMIN (GLUCOPHAGE) 500 MG tablet TAKE 2 TABLETS BY MOUTH IN THE  MORNING AND 1 TABLET BY MOUTH IN THE EVENING WITH MEALS   Multiple Vitamins-Minerals (PRESERVISION AREDS 2 PO) Take by mouth in the morning and at bedtime.   OneTouch UltraSoft 2 Lancets MISC USE TO CHECK BLOOD SUGAR DAILY   pioglitazone (ACTOS) 30 MG tablet TAKE ONE-HALF TABLET BY MOUTH  DAILY   simvastatin (ZOCOR) 40 MG tablet TAKE 1 TABLET BY MOUTH AT  BEDTIME   albuterol (VENTOLIN HFA) 108 (90 Base) MCG/ACT inhaler Inhale 2 puffs into the lungs every 6 (six) hours as needed for wheezing or shortness of breath. (Patient not taking: Reported on 05/16/2022)   alfuzosin (UROXATRAL) 10 MG 24 hr tablet Take 10 mg by mouth daily. (Patient not taking: Reported on 05/16/2022)   doxycycline (VIBRA-TABS) 100 MG tablet 2 tabs after sex to prevent STI (Patient not taking: Reported on 05/16/2022)   ivermectin (STROMECTOL) 3 MG TABS tablet Take 7 tablets on day one and repeat in 14 days (Patient not taking: Reported on 05/16/2022)   malathion (OVIDE) 0.5 % lotion Sprinkle lotion on dry hair and affected area, rub gently  until area thoroughly moistened. Allow to dry naturally and wash off after 12 hours, can repeat in one week (Patient not taking: Reported on 05/16/2022)   No facility-administered encounter medications on file as of 05/16/2022.    Allergies (verified) Alcohol, Cat hair extract, Pollen extract, and Tamsulosin   History: Past Medical History:  Diagnosis Date   BPH (benign prostatic hyperplasia)    Diabetes mellitus    Type II, controlled   Fracture 2011   Right proximal humerus, resolved w/o deficit   HBV (hepatitis B virus) infection    History of cardiac cath 1999    and Tilt Table Test, both negative   Hyperlipidemia    Hypertension    Impotence of organic origin    Intertrigo 08/10/2020   Keloid 08/04/2021   On pre-exposure prophylaxis for HIV 02/10/2020   Special screening for malignant neoplasms of other sites    Past Surgical History:  Procedure Laterality Date   APPENDECTOMY     TIBIA FRACTURE SURGERY     Left, resolved without deficit   TONSILLECTOMY     Family History  Problem Relation Age of Onset   Cancer Father        colon   Seizures Father        after head injury   Colon cancer Father    Colon cancer Mother    Arthritis Brother    Prostate cancer Neg Hx    Social History   Socioeconomic History   Marital status: Married    Spouse name: Not on file   Number of children: 4   Years of education: Not on file   Highest education level: Not on file  Occupational History   Occupation: Art gallery manager at [employer]    Employer: [employer]   Occupation: Pilot  Tobacco Use   Smoking status: Former    Types: Cigarettes    Quit date: 02/05/1985    Years since quitting: 37.2   Smokeless tobacco: Never   Tobacco comments:    Smoked from 1966 to 1987  Substance and Sexual Activity   Alcohol use: No    Alcohol/week: 0.0 standard drinks of alcohol   Drug use: No   Sexual activity: Never    Comment: declined condoms  Other Topics Concern   Not on file  Social History Narrative   Undergrad and Masters at [employer].   Second marriage since 1987   4 children, 3 step-children   Flew small aircraft- retired from flying 2019   Retired 2023   Social Determinants of [employer]: Low Risk  (05/16/2022)   Overall Financial Resource Strain (CARDIA)    Difficulty of Paying Living Expenses: Not hard at all  Food Insecurity: No Food Insecurity (05/16/2022)   Hunger Vital Sign    Worried About Running Out of Food in the Last Year: Never true    Ran Out of Food in the Last Year: Never true  Transportation Needs: No  Transportation Needs (05/16/2022)   PRAPARE - Administrator, Civil Service (Medical): No    Lack of Transportation (Non-Medical): No  Physical Activity: Inactive (05/16/2022)   Exercise Vital Sign    Days of Exercise per Week: 0 days    Minutes of Exercise per Session: 0 min  Stress: No Stress Concern Present (05/16/2022)   [employer] of Occupational Health - Occupational Stress Questionnaire    Feeling of Stress : Not at all  Social Connections: Socially Integrated (05/16/2022)  Social Advertising account executive [NHANES]    Frequency of Communication with Friends and Family: More than three times a week    Frequency of Social Gatherings with Friends and Family: More than three times a week    Attends Religious Services: More than 4 times per year    Active Member of [employer] or Organizations: Yes    Attends Engineer, structural: More than 4 times per year    Marital Status: Married    Tobacco Counseling Counseling given: Not Answered Tobacco comments: Smoked from 1966 to 1987   Clinical Intake:  Pre-visit preparation completed: Yes  Pain : 0-10 Pain Score: 1  Pain Type: Acute pain Pain Location: Back (pulled muscle in back) Pain Orientation: Mid Pain Descriptors / Indicators: Aching Pain Frequency: Intermittent     Nutritional Risks: None Diabetes: Yes CBG done?: Yes (156 per pt) CBG resulted in Enter/ Edit results?: No Did pt. bring in CBG monitor from home?: No  How often do you need to have someone help you when you read instructions, pamphlets, or other written materials from your doctor or pharmacy?: 1 - Never  Diabetic?Nutrition Risk Assessment:  Has the patient had any N/V/D within the last 2 months?  No  Does the patient have any non-healing wounds?  No  Has the patient had any unintentional weight loss or weight gain?  No   Diabetes:  Is the patient diabetic?  Yes  If diabetic, was a CBG obtained today?  Yes  Did the  patient bring in their glucometer from home?  No  How often do you monitor your CBG's? QD.   Financial Strains and Diabetes Management:  Are you having any financial strains with the device, your supplies or your medication? No .  Does the patient want to be seen by Chronic Care Management for management of their diabetes?  No  Would the patient like to be referred to a Nutritionist or for Diabetic Management?  No   Diabetic Exams:  Diabetic Eye Exam: Completed UTD per pt unknown provider Diabetic Foot Exam: Completed 09/18/21 PCP     Interpreter Needed?: No  Information entered by :: C.Christle Nolting LPN   Activities of Daily Living    05/16/2022   12:19 PM  In your present state of health, do you have any difficulty performing the following activities:  Hearing? 1  Vision? 0  Difficulty concentrating or making decisions? 0  Walking or climbing stairs? 1  Comment Back injury  Dressing or bathing? 0  Doing errands, shopping? 0  Preparing Food and eating ? N  Using the Toilet? N  In the past six months, have you accidently leaked urine? Y  Comment Currently undergoing treatment  Do you have problems with loss of bowel control? N  Managing your Medications? N  Managing your Finances? N  Housekeeping or managing your Housekeeping? N    Patient Care Team: Joaquim Nam, MD as PCP - Darl Pikes, [facility] as Pharmacist (Pharmacist) Daiva Eves, Lisette Grinder, MD as Consulting Physician (Infectious Diseases)  Indicate any recent Medical Services you may have received from other than [facility] providers in the past year (date may be approximate).     Assessment:   This is a routine wellness examination for [place].  Hearing/Vision screen Hearing Screening - Comments:: No aids Vision Screening - Comments:: [facility] - unknown provider  Dietary issues and exercise activities discussed: Current Exercise Habits: The patient does not participate in regular exercise at present,  Exercise  limited by: orthopedic condition(s) (back injury)   Goals Addressed             This Visit's Progress    Patient Stated       No new goals       Depression Screen    05/16/2022   12:17 PM 11/07/2021   10:52 AM 08/04/2021   11:36 AM 04/24/2021   10:35 AM 03/01/2021    3:44 PM 09/16/2020   10:21 AM 08/10/2020    9:35 AM  PHQ 2/9 Scores  PHQ - 2 Score 0 0 0 0 0 0 0    Fall Risk    05/16/2022   12:09 PM 11/07/2021   10:52 AM 08/04/2021   11:36 AM 04/24/2021   10:33 AM 03/01/2021    3:44 PM  Fall Risk   Falls in the past year? 0 1 1 1  0  Number falls in past yr: 1 1 0 0   Injury with Fall? 0 0 1 1   Risk for fall due to : No Fall Risks;Impaired balance/gait History of fall(s) History of fall(s);Impaired balance/gait Orthopedic patient   Risk for fall due to: Comment tripped over something      Follow up Falls prevention discussed;Falls evaluation completed;Education provided Falls evaluation completed Falls evaluation completed Falls prevention discussed     FALL RISK PREVENTION PERTAINING TO THE HOME:  Any stairs in or around the home? Yes  If so, are there any without handrails? No  Home free of loose throw rugs in walkways, pet beds, electrical cords, etc?  has a throw rug in walkway. Adequate lighting in your home to reduce risk of falls? Yes   ASSISTIVE DEVICES UTILIZED TO PREVENT FALLS:  Life alert? No  Use of a cane, walker or w/c? Yes  Grab bars in the bathroom? No  Shower chair or bench in shower? Yes  Elevated toilet seat or a handicapped toilet? Yes    Cognitive Function:    10/07/2019   12:11 PM 09/20/2017    8:47 AM 09/12/2016    2:05 PM  MMSE - Mini Mental State Exam  Orientation to time 5 5 5   Orientation to Place 5 5 5   Registration 3 3 3   Attention/ Calculation 5 0 0  Recall 3 3 3   Language- name 2 objects  0 0  Language- repeat 1 1 1   Language- follow 3 step command  3 3  Language- read & follow direction  0 0  Write a sentence  0 0   Copy design  0 0  Total score  20 20        05/16/2022   12:25 PM 04/24/2021   10:41 AM  6CIT Screen  What Year? 0 points 0 points  What month? 0 points 0 points  What time? 0 points 0 points  Count back from 20 0 points 0 points  Months in reverse 0 points 0 points  Repeat phrase 2 points 0 points  Total Score 2 points 0 points    Immunizations Immunization History  Administered Date(s) Administered   COVID-19, mRNA, vaccine(Comirnaty)12 years and older 11/29/2021   Fluad Quad(high Dose 65+) 10/28/2019, 11/25/2020   Hepatitis A, Adult 07/30/2019, 02/10/2020   Influenza, High Dose Seasonal PF 03/28/2018   PFIZER(Purple Top)SARS-COV-2 Vaccination 03/12/2019, 04/07/2019, 11/21/2019   Pfizer Covid-19 Vaccine Bivalent Booster 14yrs & up 11/25/2020   Pneumococcal Conjugate-13 08/31/2014   Pneumococcal Polysaccharide-23 02/06/2004, 09/18/2011   Td 02/06/2004   Tdap 12/04/2016  TDAP status: Up to date  Flu Vaccine status: Up to date [facility]  Pneumococcal vaccine status: Up to date  Covid-19 vaccine status: Information provided on how to obtain vaccines.   Qualifies for Shingles Vaccine? Yes   Zostavax completed No   Shingrix Completed?: No.    Education has been provided regarding the importance of this vaccine. Patient has been advised to call insurance company to determine out of pocket expense if they have not yet received this vaccine. Advised may also receive vaccine at local pharmacy or Health Dept. Verbalized acceptance and understanding.  Screening Tests Health Maintenance  Topic Date Due   Zoster Vaccines- Shingrix (1 of 2) Never done   Diabetic kidney evaluation - Urine ACR  09/15/2010   OPHTHALMOLOGY EXAM  08/11/2020   COVID-19 Vaccine (6 - 2023-24 season) 01/24/2022   HEMOGLOBIN A1C  03/21/2022   INFLUENZA VACCINE  09/06/2022   FOOT EXAM  09/19/2022   Diabetic kidney evaluation - eGFR measurement  05/09/2023   Medicare Annual Wellness (AWV)  05/16/2023    COLONOSCOPY (Pts 45-6721yrs Insurance coverage will need to be confirmed)  08/16/2025   DTaP/Tdap/Td (3 - Td or Tdap) 12/05/2026   Pneumonia Vaccine 6265+ Years old  Completed   HPV VACCINES  Aged Out    Health Maintenance  Health Maintenance Due  Topic Date Due   Zoster Vaccines- Shingrix (1 of 2) Never done   Diabetic kidney evaluation - Urine ACR  09/15/2010   OPHTHALMOLOGY EXAM  08/11/2020   COVID-19 Vaccine (6 - 2023-24 season) 01/24/2022   HEMOGLOBIN A1C  03/21/2022    Colorectal cancer screening: Type of screening: Colonoscopy. Completed 08/16/20. Repeat every 3 years  Lung Cancer Screening: (Low Dose CT Chest recommended if Age 25-80 years, 30 pack-year currently smoking OR have quit w/in 15years.) does not qualify.   Lung Cancer Screening Referral: no  Additional Screening:  Hepatitis C Screening: does not qualify; Completed no  Vision Screening: Recommended annual ophthalmology exams for early detection of glaucoma and other disorders of the eye. Is the patient up to date with their annual eye exam?  No  Who is the provider or what is the name of the office in which the patient attends annual eye exams? Unknown provider If pt is not established with a provider, would they like to be referred to a provider to establish care? No .   Dental Screening: Recommended annual dental exams for proper oral hygiene  Community Resource Referral / Chronic Care Management: CRR required this visit?  No   CCM required this visit?  No      Plan:     I have personally reviewed and noted the following in the patient's chart:   Medical and social history Use of alcohol, tobacco or illicit drugs  Current medications and supplements including opioid prescriptions. Patient is not currently taking opioid prescriptions. Functional ability and status Nutritional status Physical activity Advanced directives List of other physicians Hospitalizations, surgeries, and ER visits in  previous 12 months Vitals Screenings to include cognitive, depression, and falls Referrals and appointments  In addition, I have reviewed and discussed with patient certain preventive protocols, quality metrics, and best practice recommendations. A written personalized care plan for preventive services as well as general preventive health recommendations were provided to patient.     Maryan PulsChristan M August Longest, LPN   2/84/13244/11/2022   Nurse Notes: none

## 2022-05-16 NOTE — Patient Instructions (Signed)
Jacob Ponce , Thank you for taking time to come for your Medicare Wellness Visit. I appreciate your ongoing commitment to your health goals. Please review the following plan we discussed and let me know if I can assist you in the future.   These are the goals we discussed:  Goals      Patient Stated     Jacob Ponce 09/20/2017, I will continue to take medications as prescribed.      Patient Stated     04/24/21 - I will continue to walk every morning for about 2 miles.      Patient Stated     No new goals        This is a list of the screening recommended for you and due dates:  Health Maintenance  Topic Date Due   Zoster (Shingles) Vaccine (1 of 2) Never done   Yearly kidney health urinalysis for diabetes  09/15/2010   Eye exam for diabetics  08/11/2020   COVID-19 Vaccine (6 - 2023-24 season) 01/24/2022   Hemoglobin A1C  03/21/2022   Flu Shot  09/06/2022   Complete foot exam   09/19/2022   Yearly kidney function blood test for diabetes  05/09/2023   Medicare Annual Wellness Visit  05/16/2023   Colon Cancer Screening  08/16/2025   DTaP/Tdap/Td vaccine (3 - Td or Tdap) 12/05/2026   Pneumonia Vaccine  Completed   HPV Vaccine  Aged Out    Advanced directives: Jacob Ponce , Thank you for taking time to come for your Medicare Wellness Visit. I appreciate your ongoing commitment to your health goals. Please review the following plan we discussed and let me know if I can assist you in the future.   These are the goals we discussed:  Goals      Patient Stated     Jacob Ponce 09/20/2017, I will continue to take medications as prescribed.      Patient Stated     04/24/21 - I will continue to walk every morning for about 2 miles.      Patient Stated     No new goals        This is a list of the screening recommended for you and due dates:  Health Maintenance  Topic Date Due   Zoster (Shingles) Vaccine (1 of 2) Never done   Yearly kidney health urinalysis for diabetes  09/15/2010   Eye  exam for diabetics  08/11/2020   COVID-19 Vaccine (6 - 2023-24 season) 01/24/2022   Hemoglobin A1C  03/21/2022   Flu Shot  09/06/2022   Complete foot exam   09/19/2022   Yearly kidney function blood test for diabetes  05/09/2023   Medicare Annual Wellness Visit  05/16/2023   Colon Cancer Screening  08/16/2025   DTaP/Tdap/Td vaccine (3 - Td or Tdap) 12/05/2026   Pneumonia Vaccine  Completed   HPV Vaccine  Aged Out    Advanced directives: Please bring a copy of your health care power of attorney and living will to the office to be added to your chart at your convenience.   Conditions/risks identified: Aim for 30 minutes of exercise or brisk walking, 6-8 glasses of water, and 5 servings of fruits and vegetables each day.   Next appointment: Follow up in one year for your annual wellness visit. 05/20/23 @ 1:30 televisit  Preventive Care 65 Years and Older, Male  Preventive care refers to lifestyle choices and visits with your health care provider that can promote health and wellness. What  does preventive care include? A yearly physical exam. This is also called an annual well check. Dental exams once or twice a year. Routine eye exams. Ask your health care provider how often you should have your eyes checked. Personal lifestyle choices, including: Daily care of your teeth and gums. Regular physical activity. Eating a healthy diet. Avoiding tobacco and drug use. Limiting alcohol use. Practicing safe sex. Taking low doses of aspirin every day. Taking vitamin and mineral supplements as recommended by your health care provider. What happens during an annual well check? The services and screenings done by your health care provider during your annual well check will depend on your age, overall health, lifestyle risk factors, and family history of disease. Counseling  Your health care provider may ask you questions about your: Alcohol use. Tobacco use. Drug use. Emotional  well-being. Home and relationship well-being. Sexual activity. Eating habits. History of falls. Memory and ability to understand (cognition). Work and work Astronomerenvironment. Screening  You may have the following tests or measurements: Height, weight, and BMI. Blood pressure. Lipid and cholesterol levels. These may be checked every 5 years, or more frequently if you are over 80 years old. Skin check. Lung cancer screening. You may have this screening every year starting at age 80 if you have a 30-pack-year history of smoking and currently smoke or have quit within the past 15 years. Fecal occult blood test (FOBT) of the stool. You may have this test every year starting at age 80. Flexible sigmoidoscopy or colonoscopy. You may have a sigmoidoscopy every 5 years or a colonoscopy every 10 years starting at age 80. Prostate cancer screening. Recommendations will vary depending on your family history and other risks. Hepatitis C blood test. Hepatitis B blood test. Sexually transmitted disease (STD) testing. Diabetes screening. This is done by checking your blood sugar (glucose) after you have not eaten for a while (fasting). You may have this done every 1-3 years. Abdominal aortic aneurysm (AAA) screening. You may need this if you are a current or former smoker. Osteoporosis. You may be screened starting at age 80 if you are at high risk. Talk with your health care provider about your test results, treatment options, and if necessary, the need for more tests. Vaccines  Your health care provider may recommend certain vaccines, such as: Influenza vaccine. This is recommended every year. Tetanus, diphtheria, and acellular pertussis (Tdap, Td) vaccine. You may need a Td booster every 10 years. Zoster vaccine. You may need this after age 460. Pneumococcal 13-valent conjugate (PCV13) vaccine. One dose is recommended after age 80. Pneumococcal polysaccharide (PPSV23) vaccine. One dose is recommended after  age 80. Talk to your health care provider about which screenings and vaccines you need and how often you need them. This information is not intended to replace advice given to you by your health care provider. Make sure you discuss any questions you have with your health care provider. Document Released: 02/18/2015 Document Revised: 10/12/2015 Document Reviewed: 11/23/2014 Elsevier Interactive Patient Education  2017 [employer]  Fall Prevention in the Home Falls can cause injuries. They can happen to people of all ages. There are many things you can do to make your home safe and to help prevent falls. What can I do on the outside of my home? Regularly fix the edges of walkways and driveways and fix any cracks. Remove anything that might make you trip as you walk through a door, such as a raised step or threshold. Trim any bushes  or trees on the path to your home. Use bright outdoor lighting. Clear any walking paths of anything that might make someone trip, such as rocks or tools. Regularly check to see if handrails are loose or broken. Make sure that both sides of any steps have handrails. Any raised decks and porches should have guardrails on the edges. Have any leaves, snow, or ice cleared regularly. Use sand or salt on walking paths during winter. Clean up any spills in your garage right away. This includes oil or grease spills. What can I do in the bathroom? Use night lights. Install grab bars by the toilet and in the tub and shower. Do not use towel bars as grab bars. Use non-skid mats or decals in the tub or shower. If you need to sit down in the shower, use a plastic, non-slip stool. Keep the floor dry. Clean up any water that spills on the floor as soon as it happens. Remove soap buildup in the tub or shower regularly. Attach bath mats securely with double-sided non-slip rug tape. Do not have throw rugs and other things on the floor that can make you trip. What can I do in the  bedroom? Use night lights. Make sure that you have a light by your bed that is easy to reach. Do not use any sheets or blankets that are too big for your bed. They should not hang down onto the floor. Have a firm chair that has side arms. You can use this for support while you get dressed. Do not have throw rugs and other things on the floor that can make you trip. What can I do in the kitchen? Clean up any spills right away. Avoid walking on wet floors. Keep items that you use a lot in easy-to-reach places. If you need to reach something above you, use a strong step stool that has a grab bar. Keep electrical cords out of the way. Do not use floor polish or wax that makes floors slippery. If you must use wax, use non-skid floor wax. Do not have throw rugs and other things on the floor that can make you trip. What can I do with my stairs? Do not leave any items on the stairs. Make sure that there are handrails on both sides of the stairs and use them. Fix handrails that are broken or loose. Make sure that handrails are as long as the stairways. Check any carpeting to make sure that it is firmly attached to the stairs. Fix any carpet that is loose or worn. Avoid having throw rugs at the top or bottom of the stairs. If you do have throw rugs, attach them to the floor with carpet tape. Make sure that you have a light switch at the top of the stairs and the bottom of the stairs. If you do not have them, ask someone to add them for you. What else can I do to help prevent falls? Wear shoes that: Do not have high heels. Have rubber bottoms. Are comfortable and fit you well. Are closed at the toe. Do not wear sandals. If you use a stepladder: Make sure that it is fully opened. Do not climb a closed stepladder. Make sure that both sides of the stepladder are locked into place. Ask someone to hold it for you, if possible. Clearly mark and make sure that you can see: Any grab bars or  handrails. First and last steps. Where the edge of each step is. Use tools that help  you move around (mobility aids) if they are needed. These include: Canes. Walkers. Scooters. Crutches. Turn on the lights when you go into a dark area. Replace any light bulbs as soon as they burn out. Set up your furniture so you have a clear path. Avoid moving your furniture around. If any of your floors are uneven, fix them. If there are any pets around you, be aware of where they are. Review your medicines with your doctor. Some medicines can make you feel dizzy. This can increase your chance of falling. Ask your doctor what other things that you can do to help prevent falls. This information is not intended to replace advice given to you by your health care provider. Make sure you discuss any questions you have with your health care provider. Document Released: 11/18/2008 Document Revised: 06/30/2015 Document Reviewed: 02/26/2014 Elsevier Interactive Patient Education  2017 [employer] Inc[employer]   Conditions/risks identified: Aim for 30 minutes of exercise or brisk walking, 6-8 glasses of water, and 5 servings of fruits and vegetables each day.   Next appointment: Follow up in one year for your annual wellness visit. 05/20/23 @ 1:30 televisit  Preventive Care 65 Years and Older, Male  Preventive care refers to lifestyle choices and visits with your health care provider that can promote health and wellness. What does preventive care include? A yearly physical exam. This is also called an annual well check. Dental exams once or twice a year. Routine eye exams. Ask your health care provider how often you should have your eyes checked. Personal lifestyle choices, including: Daily care of your teeth and gums. Regular physical activity. Eating a healthy diet. Avoiding tobacco and drug use. Limiting alcohol use. Practicing safe sex. Taking low doses of aspirin every day. Taking vitamin and mineral  supplements as recommended by your health care provider. What happens during an annual well check? The services and screenings done by your health care provider during your annual well check will depend on your age, overall health, lifestyle risk factors, and family history of disease. Counseling  Your health care provider may ask you questions about your: Alcohol use. Tobacco use. Drug use. Emotional well-being. Home and relationship well-being. Sexual activity. Eating habits. History of falls. Memory and ability to understand (cognition). Work and work Astronomer. Screening  You may have the following tests or measurements: Height, weight, and BMI. Blood pressure. Lipid and cholesterol levels. These may be checked every 5 years, or more frequently if you are over 72 years old. Skin check. Lung cancer screening. You may have this screening every year starting at age 83 if you have a 30-pack-year history of smoking and currently smoke or have quit within the past 15 years. Fecal occult blood test (FOBT) of the stool. You may have this test every year starting at age 67. Flexible sigmoidoscopy or colonoscopy. You may have a sigmoidoscopy every 5 years or a colonoscopy every 10 years starting at age 84. Prostate cancer screening. Recommendations will vary depending on your family history and other risks. Hepatitis C blood test. Hepatitis B blood test. Sexually transmitted disease (STD) testing. Diabetes screening. This is done by checking your blood sugar (glucose) after you have not eaten for a while (fasting). You may have this done every 1-3 years. Abdominal aortic aneurysm (AAA) screening. You may need this if you are a current or former smoker. Osteoporosis. You may be screened starting at age 15 if you are at high risk. Talk with your health care provider about your  test results, treatment options, and if necessary, the need for more tests. Vaccines  Your health care provider  may recommend certain vaccines, such as: Influenza vaccine. This is recommended every year. Tetanus, diphtheria, and acellular pertussis (Tdap, Td) vaccine. You may need a Td booster every 10 years. Zoster vaccine. You may need this after age 96. Pneumococcal 13-valent conjugate (PCV13) vaccine. One dose is recommended after age 86. Pneumococcal polysaccharide (PPSV23) vaccine. One dose is recommended after age 34. Talk to your health care provider about which screenings and vaccines you need and how often you need them. This information is not intended to replace advice given to you by your health care provider. Make sure you discuss any questions you have with your health care provider. Document Released: 02/18/2015 Document Revised: 10/12/2015 Document Reviewed: 11/23/2014 Elsevier Interactive Patient Education  2017 [place] Prevention in the Home Falls can cause injuries. They can happen to people of all ages. There are many things you can do to make your home safe and to help prevent falls. What can I do on the outside of my home? Regularly fix the edges of walkways and driveways and fix any cracks. Remove anything that might make you trip as you walk through a door, such as a raised step or threshold. Trim any bushes or trees on the path to your home. Use bright outdoor lighting. Clear any walking paths of anything that might make someone trip, such as rocks or tools. Regularly check to see if handrails are loose or broken. Make sure that both sides of any steps have handrails. Any raised decks and porches should have guardrails on the edges. Have any leaves, snow, or ice cleared regularly. Use sand or salt on walking paths during winter. Clean up any spills in your garage right away. This includes oil or grease spills. What can I do in the bathroom? Use night lights. Install grab bars by the toilet and in the tub and shower. Do not use towel bars as grab bars. Use  non-skid mats or decals in the tub or shower. If you need to sit down in the shower, use a plastic, non-slip stool. Keep the floor dry. Clean up any water that spills on the floor as soon as it happens. Remove soap buildup in the tub or shower regularly. Attach bath mats securely with double-sided non-slip rug tape. Do not have throw rugs and other things on the floor that can make you trip. What can I do in the bedroom? Use night lights. Make sure that you have a light by your bed that is easy to reach. Do not use any sheets or blankets that are too big for your bed. They should not hang down onto the floor. Have a firm chair that has side arms. You can use this for support while you get dressed. Do not have throw rugs and other things on the floor that can make you trip. What can I do in the kitchen? Clean up any spills right away. Avoid walking on wet floors. Keep items that you use a lot in easy-to-reach places. If you need to reach something above you, use a strong step stool that has a grab bar. Keep electrical cords out of the way. Do not use floor polish or wax that makes floors slippery. If you must use wax, use non-skid floor wax. Do not have throw rugs and other things on the floor that can make you trip. What can I do with my  stairs? Do not leave any items on the stairs. Make sure that there are handrails on both sides of the stairs and use them. Fix handrails that are broken or loose. Make sure that handrails are as long as the stairways. Check any carpeting to make sure that it is firmly attached to the stairs. Fix any carpet that is loose or worn. Avoid having throw rugs at the top or bottom of the stairs. If you do have throw rugs, attach them to the floor with carpet tape. Make sure that you have a light switch at the top of the stairs and the bottom of the stairs. If you do not have them, ask someone to add them for you. What else can I do to help prevent falls? Wear  shoes that: Do not have high heels. Have rubber bottoms. Are comfortable and fit you well. Are closed at the toe. Do not wear sandals. If you use a stepladder: Make sure that it is fully opened. Do not climb a closed stepladder. Make sure that both sides of the stepladder are locked into place. Ask someone to hold it for you, if possible. Clearly mark and make sure that you can see: Any grab bars or handrails. First and last steps. Where the edge of each step is. Use tools that help you move around (mobility aids) if they are needed. These include: Canes. Walkers. Scooters. Crutches. Turn on the lights when you go into a dark area. Replace any light bulbs as soon as they burn out. Set up your furniture so you have a clear path. Avoid moving your furniture around. If any of your floors are uneven, fix them. If there are any pets around you, be aware of where they are. Review your medicines with your doctor. Some medicines can make you feel dizzy. This can increase your chance of falling. Ask your doctor what other things that you can do to help prevent falls. This information is not intended to replace advice given to you by your health care provider. Make sure you discuss any questions you have with your health care provider. Document Released: 11/18/2008 Document Revised: 06/30/2015 Document Reviewed: 02/26/2014 Elsevier Interactive Patient Education  2017 [employer].

## 2022-05-17 ENCOUNTER — Ambulatory Visit (INDEPENDENT_AMBULATORY_CARE_PROVIDER_SITE_OTHER): Payer: Medicare Other | Admitting: Family Medicine

## 2022-05-17 ENCOUNTER — Encounter: Payer: Self-pay | Admitting: Family Medicine

## 2022-05-17 VITALS — BP 116/80 | HR 87 | Temp 97.9°F | Ht 67.5 in | Wt 183.0 lb

## 2022-05-17 DIAGNOSIS — E785 Hyperlipidemia, unspecified: Secondary | ICD-10-CM

## 2022-05-17 DIAGNOSIS — Z7189 Other specified counseling: Secondary | ICD-10-CM

## 2022-05-17 DIAGNOSIS — E119 Type 2 diabetes mellitus without complications: Secondary | ICD-10-CM | POA: Diagnosis not present

## 2022-05-17 DIAGNOSIS — Z0000 Encounter for general adult medical examination without abnormal findings: Secondary | ICD-10-CM

## 2022-05-17 DIAGNOSIS — I10 Essential (primary) hypertension: Secondary | ICD-10-CM

## 2022-05-17 DIAGNOSIS — S22030D Wedge compression fracture of third thoracic vertebra, subsequent encounter for fracture with routine healing: Secondary | ICD-10-CM | POA: Diagnosis not present

## 2022-05-17 DIAGNOSIS — M810 Age-related osteoporosis without current pathological fracture: Secondary | ICD-10-CM

## 2022-05-17 MED ORDER — METFORMIN HCL 500 MG PO TABS: ORAL_TABLET | ORAL | 2 refills | Status: DC

## 2022-05-17 NOTE — Progress Notes (Signed)
Diabetes:  Using medications without difficulties:yes Hypoglycemic episodes:no Hyperglycemic episodes:no Feet problems:no Blood Sugars averaging: 130-190 eye exam within last year: 12/26/21 See notes on labs.  Hypertension:    Using medication without problems or lightheadedness: yes Chest pain with exertion:no Edema:no Short of breath: only sig exertion, ie mult flights of steps with quick recovery Recheck Cr pending.  Exercise limited by his back.    H/o compression fx noted.  Needs order for DXA, discussed.   He is followed by infectious disease clinic.  I will defer.  He agrees.  Elevated Cholesterol: Using medications without problems: yes Muscle aches: not from statin.  He attributed to prev back injury.   Diet compliance: yes Exercise: d/w pt.    Flu and shingles d/w pt.   He can get at pharmacy.  covid vaccine 2021 Tdap 2018 PNA up to date.  Advance directive- wife designated if patient were incapacitated.  Colonoscopy 2022  PMH and SH reviewed.   Vital signs, Meds and allergies reviewed.  ROS: Per HPI unless specifically indicated in ROS section   GEN: nad, alert and oriented HEENT: ncat NECK: supple w/o LA CV: rrr. PULM: ctab, no inc wob ABD: soft, +bs EXT: no edema SKIN: no acute rash  Diabetic foot exam: Normal inspection No skin breakdown No calluses  Normal DP pulses Normal sensation to light tough and monofilament Nails thickened.

## 2022-05-17 NOTE — Patient Instructions (Addendum)
Go to the lab on the way out.   If you have mychart we'll likely use that to update you.    Take care.  Glad to see you. If you don't get a call about podiatry, let me know.

## 2022-05-18 LAB — BASIC METABOLIC PANEL
BUN: 28 mg/dL — ABNORMAL HIGH (ref 6–23)
CO2: 25 mEq/L (ref 19–32)
Calcium: 9.7 mg/dL (ref 8.4–10.5)
Chloride: 102 mEq/L (ref 96–112)
Creatinine, Ser: 1.08 mg/dL (ref 0.40–1.50)
GFR: 64.94 mL/min (ref >60.00)
Glucose, Bld: 233 mg/dL — ABNORMAL HIGH (ref 70–99)
Potassium: 3.9 mEq/L (ref 3.5–5.1)
Sodium: 139 mEq/L (ref 135–145)

## 2022-05-18 LAB — LIPID PANEL
Cholesterol: 128 mg/dL (ref 0–200)
HDL: 46.3 mg/dL (ref >39.00)
LDL Cholesterol: 55 mg/dL (ref 0–99)
NonHDL: 81.68
Total CHOL/HDL Ratio: 3
Triglycerides: 132 mg/dL (ref 0.0–149.0)
VLDL: 26.4 mg/dL (ref 0.0–40.0)

## 2022-05-18 LAB — MICROALBUMIN / CREATININE URINE RATIO
Creatinine,U: 161.1 mg/dL
Microalb Creat Ratio: 1.8 mg/g (ref 0.0–30.0)
Microalb, Ur: 2.9 mg/dL — ABNORMAL HIGH (ref 0.0–1.9)

## 2022-05-18 LAB — HEMOGLOBIN A1C: Hgb A1c MFr Bld: 7.3 % — ABNORMAL HIGH (ref 4.6–6.5)

## 2022-05-18 LAB — VITAMIN D 25 HYDROXY (VIT D DEFICIENCY, FRACTURES): VITD: 47.79 ng/mL (ref 30.00–100.00)

## 2022-05-20 ENCOUNTER — Telehealth: Payer: Self-pay | Admitting: Family Medicine

## 2022-05-20 NOTE — Assessment & Plan Note (Signed)
H/o compression fx noted.  DXA ordered.

## 2022-05-20 NOTE — Assessment & Plan Note (Signed)
See notes on labs.  Continue metformin and Actos. ?

## 2022-05-20 NOTE — Assessment & Plan Note (Signed)
Advance directive- wife designated if patient were incapacitated.  

## 2022-05-20 NOTE — Assessment & Plan Note (Deleted)
Continue simvastatin.  See notes on labs.  Continue work on diet and exercise. ?

## 2022-05-20 NOTE — Telephone Encounter (Signed)
Opened in error

## 2022-05-20 NOTE — Assessment & Plan Note (Signed)
Continue doxazosin.  See notes on labs.

## 2022-05-20 NOTE — Assessment & Plan Note (Signed)
Flu and shingles d/w pt.   He can get at pharmacy.  covid vaccine 2021 Tdap 2018 PNA up to date.  Advance directive- wife designated if patient were incapacitated.  Colonoscopy 2022 

## 2022-05-20 NOTE — Assessment & Plan Note (Signed)
Continue simvastatin.  See notes on labs.  Continue work on diet and exercise. ?

## 2022-05-22 ENCOUNTER — Telehealth (HOSPITAL_BASED_OUTPATIENT_CLINIC_OR_DEPARTMENT_OTHER): Payer: Self-pay

## 2022-05-22 ENCOUNTER — Other Ambulatory Visit (HOSPITAL_COMMUNITY): Payer: Self-pay

## 2022-05-22 ENCOUNTER — Other Ambulatory Visit: Payer: Self-pay

## 2022-05-23 ENCOUNTER — Other Ambulatory Visit: Payer: Self-pay

## 2022-05-28 ENCOUNTER — Other Ambulatory Visit (HOSPITAL_COMMUNITY): Payer: Self-pay

## 2022-05-28 ENCOUNTER — Encounter: Payer: Self-pay | Admitting: Family Medicine

## 2022-06-01 ENCOUNTER — Encounter: Payer: Self-pay | Admitting: Podiatry

## 2022-06-01 ENCOUNTER — Ambulatory Visit (INDEPENDENT_AMBULATORY_CARE_PROVIDER_SITE_OTHER): Payer: Medicare Other | Admitting: Podiatry

## 2022-06-01 DIAGNOSIS — B351 Tinea unguium: Secondary | ICD-10-CM | POA: Diagnosis not present

## 2022-06-01 DIAGNOSIS — E119 Type 2 diabetes mellitus without complications: Secondary | ICD-10-CM | POA: Diagnosis not present

## 2022-06-01 DIAGNOSIS — M79609 Pain in unspecified limb: Secondary | ICD-10-CM | POA: Diagnosis not present

## 2022-06-01 NOTE — Progress Notes (Signed)
  Subjective:  Patient ID: Jacob Ponce, male    DOB: Sep 01, 1940,   MRN: [id number]  Chief Complaint  Patient presents with   Nail Problem    Thick painful toenails, 3 month follow up   Diabetes    Diabetic foot care    82 y.o. male presents for concern of thickened elongated and painful nails that are difficult to trim. Requesting to have them trimmed today. Relates burning and tingling in their feet. Patient is diabetic and last A1c was  Lab Results  Component Value Date   HGBA1C 7.3 (H) 05/17/2022   .   PCP:  Joaquim Nam, MD    . Denies any other pedal complaints. Denies n/v/f/c.   Past Medical History:  Diagnosis Date   BPH (benign prostatic hyperplasia)    Diabetes mellitus    Type II, controlled   Fracture 2011   Right proximal humerus, resolved w/o deficit   HBV (hepatitis B virus) infection    History of cardiac cath 1999   and Tilt Table Test, both negative   Hyperlipidemia    Hypertension    Impotence of organic origin    Intertrigo 08/10/2020   Keloid 08/04/2021   On pre-exposure prophylaxis for HIV 02/10/2020   Special screening for malignant neoplasms of other sites     Objective:  Physical Exam: Vascular: DP/PT pulses 2/4 bilateral. CFT <3 seconds. Absent hair growth on digits. Edema noted to bilateral lower extremities. Xerosis noted bilaterally.  Skin. No lacerations or abrasions bilateral feet. Nails 1-5 bilateral  are thickened discolored and elongated with subungual debris.  Musculoskeletal: MMT 5/5 bilateral lower extremities in DF, PF, Inversion and Eversion. Deceased ROM in DF of ankle joint.  Neurological: Sensation intact to light touch. Protective sensation diminished bilateral.    Assessment:   1. Pain due to onychomycosis of nail   2. Type 2 diabetes mellitus without complication, unspecified whether long term insulin use (HCC)       Plan:  Patient was evaluated and treated and all questions answered. -Discussed and educated patient  on diabetic foot care, especially with  regards to the vascular, neurological and musculoskeletal systems.   -Stressed the importance of good glycemic control and the detriment of not  controlling glucose levels in relation to the foot. -Discussed supportive shoes at all times and checking feet regularly.  -Mechanically debrided all nails 1-5 bilateral using sterile nail nipper and filed with dremel without incident  -Answered all patient questions -Patient to return  in 3 months for at risk foot care -Patient advised to call the office if any problems or questions arise in the meantime.   Louann Sjogren, DPM

## 2022-06-07 ENCOUNTER — Ambulatory Visit (HOSPITAL_BASED_OUTPATIENT_CLINIC_OR_DEPARTMENT_OTHER)
Admission: RE | Admit: 2022-06-07 | Discharge: 2022-06-07 | Disposition: A | Discharge status: Home / Self Care | Payer: Medicare Other | Source: Ambulatory Visit | Attending: Family Medicine | Admitting: Family Medicine

## 2022-06-07 ENCOUNTER — Other Ambulatory Visit (HOSPITAL_BASED_OUTPATIENT_CLINIC_OR_DEPARTMENT_OTHER): Payer: Medicare Other

## 2022-06-07 DIAGNOSIS — M810 Age-related osteoporosis without current pathological fracture: Secondary | ICD-10-CM | POA: Diagnosis not present

## 2022-06-10 ENCOUNTER — Other Ambulatory Visit: Payer: Self-pay | Admitting: Family Medicine

## 2022-06-10 MED ORDER — ALENDRONATE SODIUM 70 MG PO TABS: 70.0000 mg | ORAL_TABLET | ORAL | Status: DC

## 2022-06-18 ENCOUNTER — Ambulatory Visit (INDEPENDENT_AMBULATORY_CARE_PROVIDER_SITE_OTHER): Payer: Medicare Other | Admitting: Family Medicine

## 2022-06-18 ENCOUNTER — Encounter: Payer: Self-pay | Admitting: Family Medicine

## 2022-06-18 VITALS — BP 120/86 | HR 78 | Temp 97.7°F | Ht 67.5 in | Wt 178.0 lb

## 2022-06-18 DIAGNOSIS — M810 Age-related osteoporosis without current pathological fracture: Secondary | ICD-10-CM

## 2022-06-18 NOTE — Progress Notes (Unsigned)
D/w pt about DXA results and osteoporosis path/phys in general, including vit D and calcium.  Reasonable to consider treatment with bisphosphonate, ie fosamax.  D/w pt about risk benefit, especially GI sx, jaw and long bone pathology.   She'll consider and update me as needed.   >15 minutes spent in face to face time with patient, >50% spent in counselling or coordination of care.   

## 2022-06-18 NOTE — Patient Instructions (Signed)
Vitamin D, exercise as tolerated.  Think about fosamax in the meantime.  Thanks for your effort.  Take care.  Glad to see you.

## 2022-06-19 ENCOUNTER — Other Ambulatory Visit (HOSPITAL_COMMUNITY): Payer: Self-pay

## 2022-06-19 ENCOUNTER — Other Ambulatory Visit: Payer: Self-pay

## 2022-06-20 NOTE — Assessment & Plan Note (Signed)
Continue with Vitamin D, exercise as tolerated.  I asked him to consider tx with fosamax in the meantime.  He can consider and update me as needed.

## 2022-06-22 ENCOUNTER — Other Ambulatory Visit (HOSPITAL_COMMUNITY): Payer: Self-pay

## 2022-06-26 DIAGNOSIS — H35363 Drusen (degenerative) of macula, bilateral: Secondary | ICD-10-CM | POA: Diagnosis not present

## 2022-06-26 DIAGNOSIS — H3554 Dystrophies primarily involving the retinal pigment epithelium: Secondary | ICD-10-CM | POA: Diagnosis not present

## 2022-06-26 DIAGNOSIS — H353132 Nonexudative age-related macular degeneration, bilateral, intermediate dry stage: Secondary | ICD-10-CM | POA: Diagnosis not present

## 2022-06-29 ENCOUNTER — Other Ambulatory Visit (HOSPITAL_COMMUNITY): Payer: Self-pay

## 2022-07-20 ENCOUNTER — Other Ambulatory Visit: Payer: Self-pay | Admitting: Family Medicine

## 2022-07-25 ENCOUNTER — Other Ambulatory Visit: Payer: Self-pay

## 2022-07-25 ENCOUNTER — Other Ambulatory Visit (HOSPITAL_COMMUNITY): Payer: Self-pay

## 2022-07-31 ENCOUNTER — Other Ambulatory Visit (HOSPITAL_COMMUNITY): Payer: Self-pay

## 2022-08-06 ENCOUNTER — Other Ambulatory Visit: Payer: Self-pay

## 2022-08-06 ENCOUNTER — Other Ambulatory Visit: Payer: Self-pay | Admitting: Family Medicine

## 2022-08-07 ENCOUNTER — Other Ambulatory Visit (HOSPITAL_COMMUNITY): Payer: Self-pay

## 2022-08-07 ENCOUNTER — Other Ambulatory Visit: Payer: Self-pay

## 2022-08-07 ENCOUNTER — Other Ambulatory Visit (HOSPITAL_COMMUNITY)
Admission: RE | Admit: 2022-08-07 | Discharge: 2022-08-07 | Disposition: A | Discharge status: Home / Self Care | Payer: Medicare Other | Source: Ambulatory Visit | Attending: Infectious Disease | Admitting: Infectious Disease

## 2022-08-07 ENCOUNTER — Encounter: Payer: Self-pay | Admitting: Infectious Disease

## 2022-08-07 ENCOUNTER — Ambulatory Visit: Payer: Medicare Other | Admitting: Infectious Disease

## 2022-08-07 VITALS — BP 119/79 | HR 80 | Temp 97.3°F | Ht 68.0 in | Wt 178.0 lb

## 2022-08-07 DIAGNOSIS — Z113 Encounter for screening for infections with a predominantly sexual mode of transmission: Secondary | ICD-10-CM | POA: Diagnosis present

## 2022-08-07 DIAGNOSIS — Z79899 Other long term (current) drug therapy: Secondary | ICD-10-CM | POA: Diagnosis not present

## 2022-08-07 DIAGNOSIS — B181 Chronic viral hepatitis B without delta-agent: Secondary | ICD-10-CM

## 2022-08-07 MED ORDER — DESCOVY 200-25 MG PO TABS
1.0000 | ORAL_TABLET | Freq: Every day | ORAL | 11 refills | Status: DC
Start: 2022-08-07 — End: 2022-12-06
  Filled 2022-08-07 – 2022-08-29 (×2): qty 30, 30d supply, fill #0
  Filled 2022-09-21: qty 30, 30d supply, fill #1
  Filled 2022-10-15: qty 30, 30d supply, fill #2
  Filled 2022-11-15: qty 30, 30d supply, fill #3
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
## 2022-08-08 LAB — URINE CYTOLOGY ANCILLARY ONLY
Chlamydia: NEGATIVE
Comment: NEGATIVE
Comment: NORMAL
Neisseria Gonorrhea: NEGATIVE

## 2022-08-08 LAB — CYTOLOGY, (ORAL, ANAL, URETHRAL) ANCILLARY ONLY
Chlamydia: NEGATIVE
Chlamydia: NEGATIVE
Comment: NEGATIVE
Comment: NORMAL
Comment: NEGATIVE
Comment: NORMAL
Neisseria Gonorrhea: NEGATIVE
Neisseria Gonorrhea: NEGATIVE

## 2022-08-08 LAB — COMPLETE METABOLIC PANEL WITH GFR
ALT: 14 U/L (ref 9–46)
AST: 18 U/L (ref 10–35)
Albumin: 4.2 g/dL (ref 3.6–5.1)
Creat: 0.86 mg/dL (ref 0.70–1.22)
Globulin: 2.1 g/dL (calc) (ref 1.9–3.7)
Glucose, Bld: 231 mg/dL — ABNORMAL HIGH (ref 65–99)
Potassium: 4.1 mmol/L (ref 3.5–5.3)
Sodium: 136 mmol/L (ref 135–146)
Total Protein: 6.3 g/dL (ref 6.1–8.1)

## 2022-08-09 LAB — COMPLETE METABOLIC PANEL WITH GFR
AG Ratio: 2 (calc) (ref 1.0–2.5)
Alkaline phosphatase (APISO): 43 U/L (ref 35–144)
BUN: 23 mg/dL (ref 7–25)
CO2: 26 mmol/L (ref 20–32)
Calcium: 10.1 mg/dL (ref 8.6–10.3)
Chloride: 99 mmol/L (ref 98–110)
Total Bilirubin: 0.6 mg/dL (ref 0.2–1.2)
eGFR: 88 mL/min/{1.73_m2} (ref >=60)

## 2022-08-09 LAB — RPR: RPR Ser Ql: NONREACTIVE

## 2022-08-09 LAB — HIV-1 RNA QUANT-NO REFLEX-BLD
HIV 1 RNA Quant: NOT DETECTED Copies/mL
HIV-1 RNA Quant, Log: NOT DETECTED Log cps/mL

## 2022-08-09 LAB — HIV ANTIBODY (ROUTINE TESTING W REFLEX): HIV 1&2 Ab, 4th Generation: NONREACTIVE

## 2022-08-28 ENCOUNTER — Other Ambulatory Visit (HOSPITAL_COMMUNITY): Payer: Self-pay

## 2022-08-29 ENCOUNTER — Other Ambulatory Visit (HOSPITAL_COMMUNITY): Payer: Self-pay

## 2022-08-30 ENCOUNTER — Other Ambulatory Visit: Payer: Self-pay | Admitting: Family Medicine

## 2022-09-20 ENCOUNTER — Other Ambulatory Visit (HOSPITAL_COMMUNITY): Payer: Self-pay

## 2022-09-21 ENCOUNTER — Other Ambulatory Visit (HOSPITAL_COMMUNITY): Payer: Self-pay

## 2022-09-25 ENCOUNTER — Other Ambulatory Visit (HOSPITAL_COMMUNITY): Payer: Self-pay

## 2022-09-25 ENCOUNTER — Other Ambulatory Visit: Payer: Self-pay | Admitting: Family Medicine

## 2022-10-02 IMAGING — US US ABDOMEN LIMITED
1 series · 14 of 25 positions shown · non-contrast
Comparison: None.

CLINICAL DATA: Hepatitis C

EXAM:
ULTRASOUND ABDOMEN LIMITED RIGHT UPPER QUADRANT

[Series 1: us abdomen limited ruq (liver/gb) · 14 of 37 slices shown]
[im 1/37]
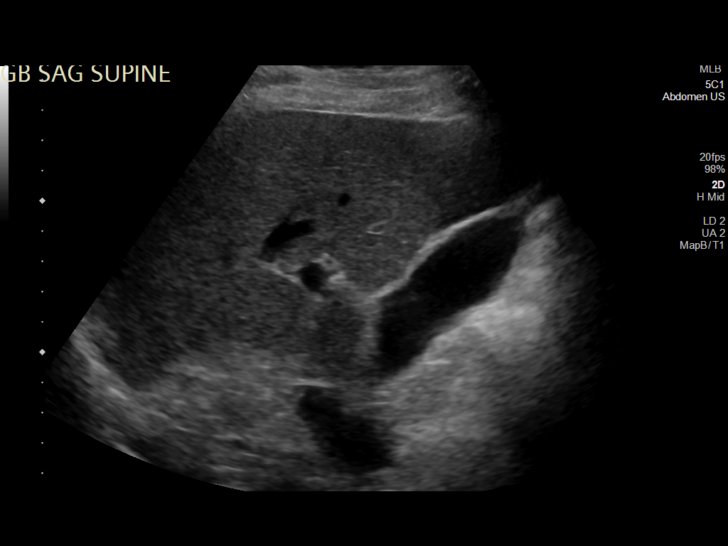
[im 4/37]
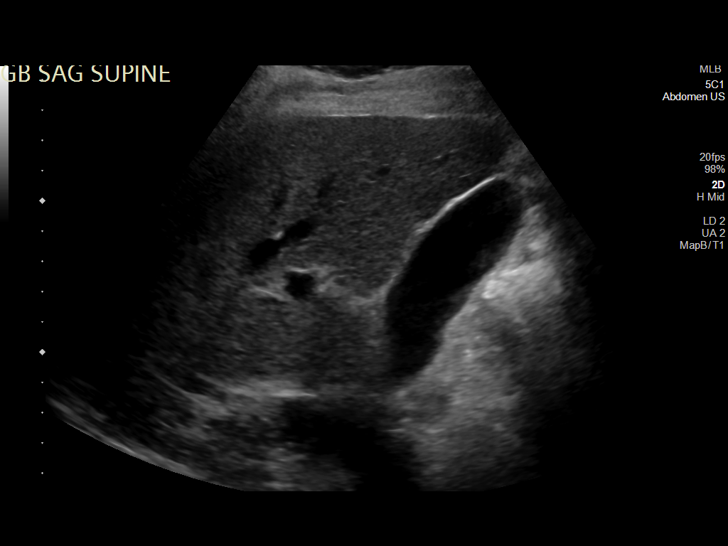
[im 7/37]
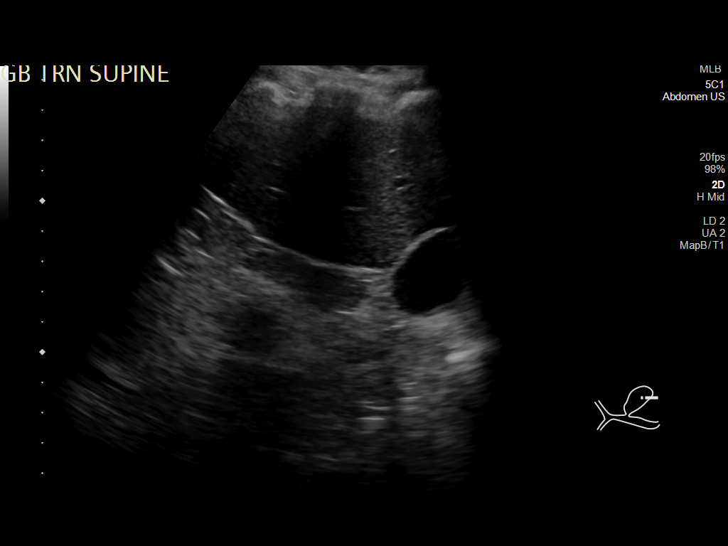
[im 10/37]
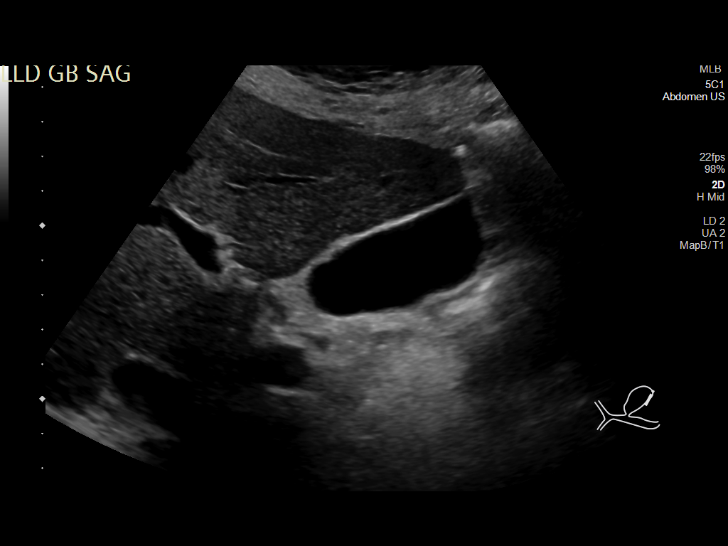
[im 13/37]
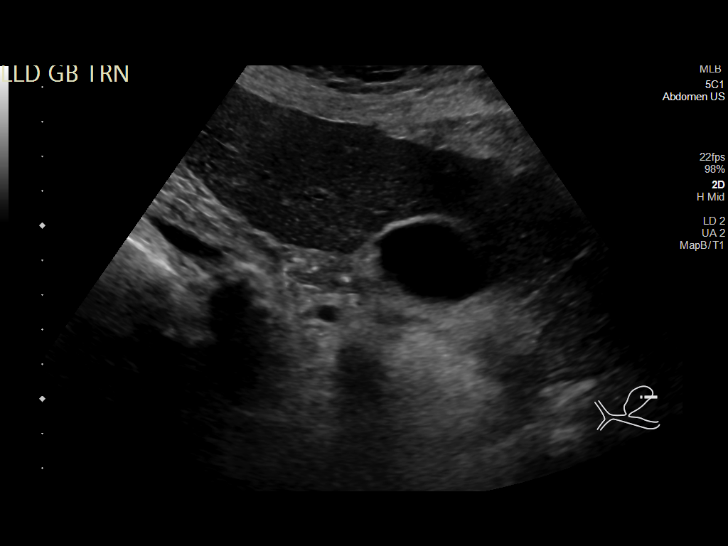
[im 14/37]
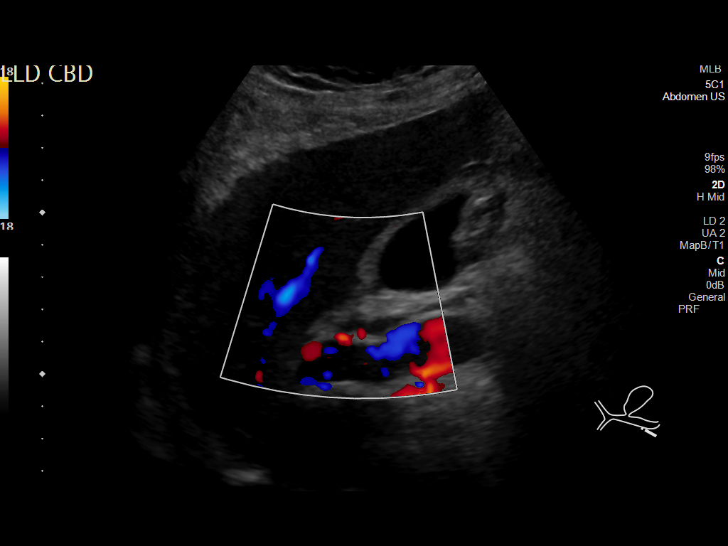
[im 17/37]
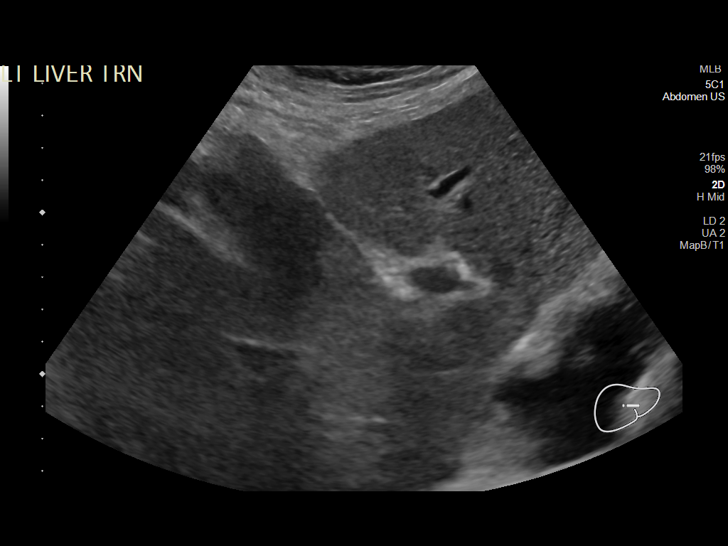
[im 20/37]
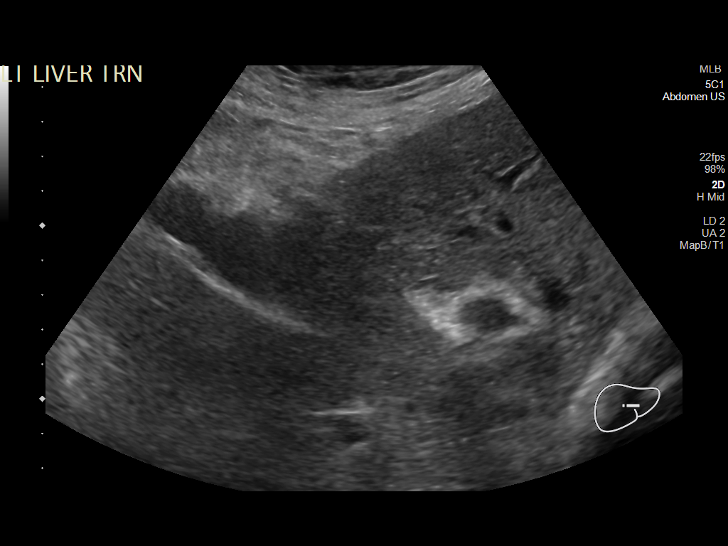
[im 23/37]
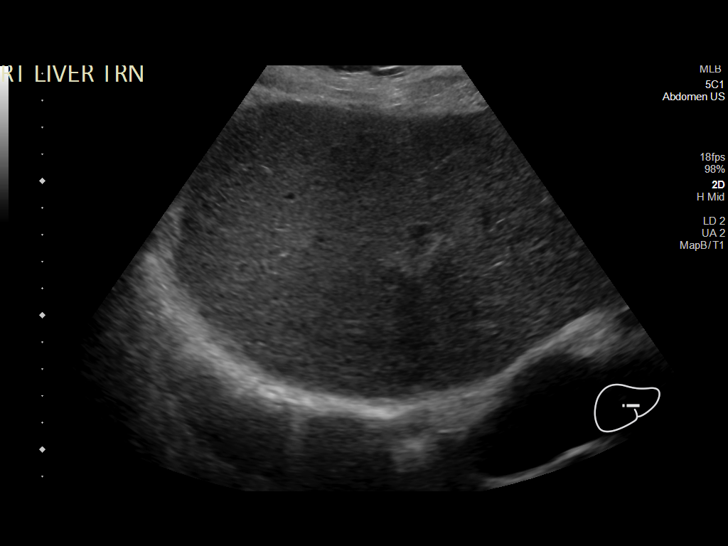
[im 25/37]
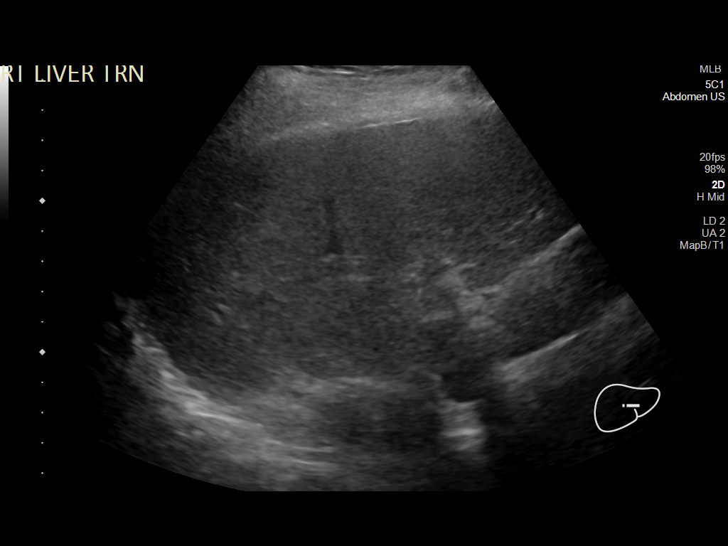
[im 28/37]
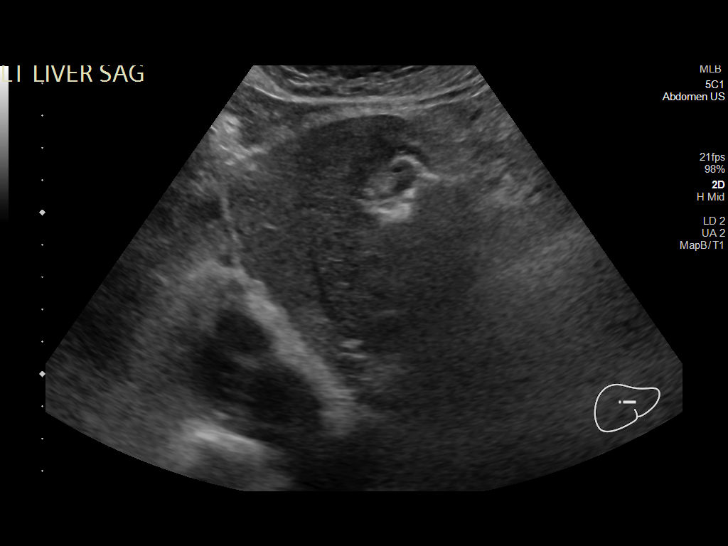
[im 31/37]
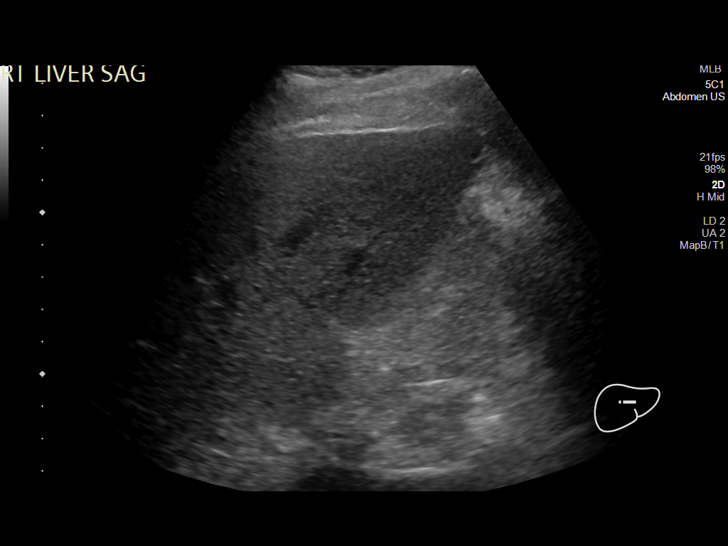
[im 34/37]
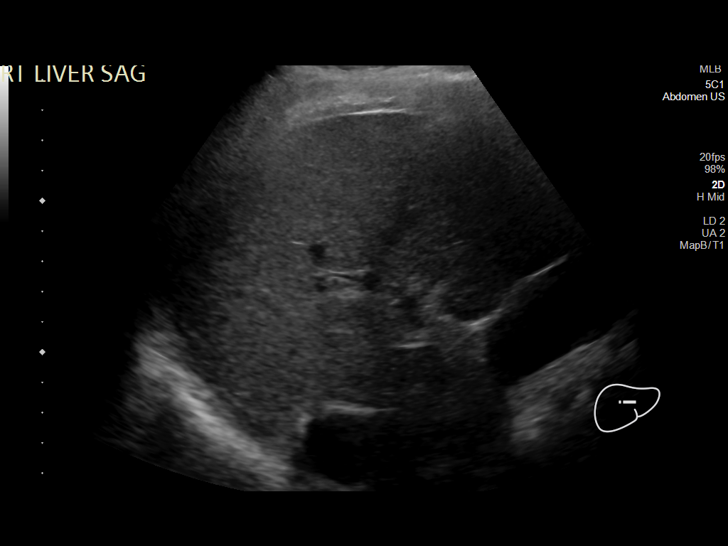
[im 37/37]
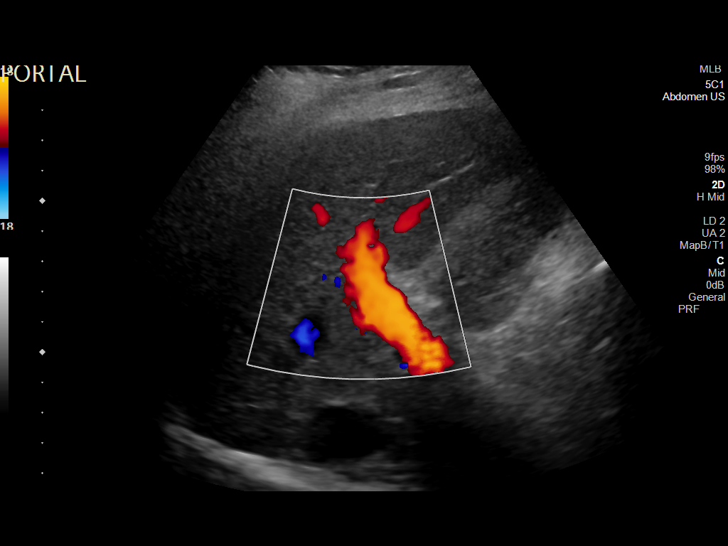

[14 of 25 positions shown; findings below may reference images not displayed]

FINDINGS: Gallbladder:

No gallstones or wall thickening visualized. No sonographic Murphy
sign noted by sonographer.

Common bile duct:

Diameter: 4.9 mm

Liver:

No focal lesion identified. Within normal limits in parenchymal
echogenicity. Portal vein is patent on color Doppler imaging with
normal direction of blood flow towards the liver.

Other: None.
IMPRESSION: 1. No masses in the liver.  No abnormalities.

## 2022-10-15 ENCOUNTER — Other Ambulatory Visit (HOSPITAL_COMMUNITY): Payer: Self-pay

## 2022-10-18 ENCOUNTER — Other Ambulatory Visit: Payer: Self-pay

## 2022-10-23 ENCOUNTER — Other Ambulatory Visit: Payer: Self-pay | Admitting: Family Medicine

## 2022-10-23 DIAGNOSIS — N401 Enlarged prostate with lower urinary tract symptoms: Secondary | ICD-10-CM

## 2022-11-11 ENCOUNTER — Other Ambulatory Visit: Payer: Self-pay | Admitting: Family Medicine

## 2022-11-11 DIAGNOSIS — E119 Type 2 diabetes mellitus without complications: Secondary | ICD-10-CM

## 2022-11-13 ENCOUNTER — Other Ambulatory Visit (INDEPENDENT_AMBULATORY_CARE_PROVIDER_SITE_OTHER): Payer: Medicare Other

## 2022-11-13 DIAGNOSIS — E119 Type 2 diabetes mellitus without complications: Secondary | ICD-10-CM

## 2022-11-13 LAB — BASIC METABOLIC PANEL
BUN: 26 mg/dL — ABNORMAL HIGH (ref 6–23)
CO2: 25 meq/L (ref 19–32)
Calcium: 10.1 mg/dL (ref 8.4–10.5)
Chloride: 102 meq/L (ref 96–112)
Creatinine, Ser: 0.89 mg/dL (ref 0.40–1.50)
GFR: 80.82 mL/min (ref >60.00)
Glucose, Bld: 211 mg/dL — ABNORMAL HIGH (ref 70–99)
Potassium: 3.7 meq/L (ref 3.5–5.1)
Sodium: 139 meq/L (ref 135–145)

## 2022-11-13 LAB — HEMOGLOBIN A1C: Hgb A1c MFr Bld: 6.9 % — ABNORMAL HIGH (ref 4.6–6.5)

## 2022-11-15 ENCOUNTER — Other Ambulatory Visit: Payer: Self-pay

## 2022-11-15 ENCOUNTER — Other Ambulatory Visit (HOSPITAL_COMMUNITY): Payer: Self-pay

## 2022-11-15 NOTE — Progress Notes (Signed)
Specialty Pharmacy Refill Coordination Note  Jacob Ponce is a 82 y.o. male contacted today regarding refills of specialty medication(s) Emtricitabine-Tenofovir Af   Patient requested Delivery   Delivery date: 11/29/22   Verified address: [address]  [place] [address]   Medication will be filled on 11/28/22.

## 2022-11-19 ENCOUNTER — Other Ambulatory Visit: Payer: Self-pay

## 2022-11-20 ENCOUNTER — Ambulatory Visit: Payer: Medicare Other

## 2022-11-20 ENCOUNTER — Other Ambulatory Visit: Payer: Self-pay

## 2022-11-20 ENCOUNTER — Ambulatory Visit: Payer: Medicare Other | Admitting: Family Medicine

## 2022-11-20 DIAGNOSIS — Z79899 Other long term (current) drug therapy: Secondary | ICD-10-CM

## 2022-11-20 DIAGNOSIS — B181 Chronic viral hepatitis B without delta-agent: Secondary | ICD-10-CM

## 2022-11-20 DIAGNOSIS — Z113 Encounter for screening for infections with a predominantly sexual mode of transmission: Secondary | ICD-10-CM

## 2022-11-21 ENCOUNTER — Other Ambulatory Visit: Payer: Medicare Other

## 2022-11-21 ENCOUNTER — Ambulatory Visit: Payer: Medicare Other

## 2022-11-21 ENCOUNTER — Encounter: Payer: Self-pay | Admitting: Family Medicine

## 2022-11-22 NOTE — Addendum Note (Signed)
Addended by: Tressa Busman T on: 11/22/2022 02:59 PM   Modules accepted: Orders

## 2022-11-23 ENCOUNTER — Other Ambulatory Visit: Payer: Self-pay

## 2022-11-23 ENCOUNTER — Other Ambulatory Visit: Payer: Medicare Other

## 2022-11-23 DIAGNOSIS — Z113 Encounter for screening for infections with a predominantly sexual mode of transmission: Secondary | ICD-10-CM

## 2022-11-23 DIAGNOSIS — Z79899 Other long term (current) drug therapy: Secondary | ICD-10-CM | POA: Diagnosis not present

## 2022-11-23 DIAGNOSIS — B181 Chronic viral hepatitis B without delta-agent: Secondary | ICD-10-CM

## 2022-11-23 DIAGNOSIS — Z114 Encounter for screening for human immunodeficiency virus [HIV]: Secondary | ICD-10-CM

## 2022-11-23 NOTE — Progress Notes (Addendum)
Patient in office for labs.  Oral swab performed and rectal swab self collected. Kynan Peasley Jonathon Resides, CMA

## 2022-11-23 NOTE — Addendum Note (Signed)
Addended by: Tressa Busman T on: 11/23/2022 10:06 AM   Modules accepted: Orders

## 2022-11-24 LAB — GC/CHLAMYDIA PROBE, AMP (THROAT)
Chlamydia trachomatis RNA: NOT DETECTED
Neisseria gonorrhoeae RNA: NOT DETECTED

## 2022-11-24 LAB — CT/NG RNA, TMA RECTAL
Chlamydia Trachomatis RNA: NOT DETECTED
Neisseria Gonorrhoeae RNA: NOT DETECTED

## 2022-11-24 LAB — C. TRACHOMATIS/N. GONORRHOEAE RNA
C. trachomatis RNA, TMA: NOT DETECTED
N. gonorrhoeae RNA, TMA: NOT DETECTED

## 2022-11-25 LAB — CBC WITH DIFFERENTIAL/PLATELET
Absolute Lymphocytes: 1362 {cells}/uL (ref 850–3900)
Absolute Monocytes: 553 {cells}/uL (ref 200–950)
Basophils Absolute: 40 {cells}/uL (ref 0–200)
Basophils Relative: 0.7 %
Eosinophils Absolute: 103 {cells}/uL (ref 15–500)
Eosinophils Relative: 1.8 %
HCT: 43.3 % (ref 38.5–50.0)
Hemoglobin: 14.8 g/dL (ref 13.2–17.1)
MCH: 31.8 pg (ref 27.0–33.0)
MCHC: 34.2 g/dL (ref 32.0–36.0)
MCV: 93.1 fL (ref 80.0–100.0)
MPV: 10.5 fL (ref 7.5–12.5)
Monocytes Relative: 9.7 %
Neutro Abs: 3642 {cells}/uL (ref 1500–7800)
Neutrophils Relative %: 63.9 %
Platelets: 234 10*3/uL (ref 140–400)
RBC: 4.65 10*6/uL (ref 4.20–5.80)
RDW: 13 % (ref 11.0–15.0)
Total Lymphocyte: 23.9 %
WBC: 5.7 10*3/uL (ref 3.8–10.8)

## 2022-11-25 LAB — COMPLETE METABOLIC PANEL WITH GFR
AG Ratio: 2.2 (calc) (ref 1.0–2.5)
ALT: 14 U/L (ref 9–46)
AST: 14 U/L (ref 10–35)
Albumin: 4.2 g/dL (ref 3.6–5.1)
Alkaline phosphatase (APISO): 43 U/L (ref 35–144)
BUN/Creatinine Ratio: 27 (calc) — ABNORMAL HIGH (ref 6–22)
BUN: 29 mg/dL — ABNORMAL HIGH (ref 7–25)
CO2: 25 mmol/L (ref 20–32)
Calcium: 10.1 mg/dL (ref 8.6–10.3)
Chloride: 100 mmol/L (ref 98–110)
Creat: 1.07 mg/dL (ref 0.70–1.22)
Globulin: 1.9 g/dL (ref 1.9–3.7)
Glucose, Bld: 252 mg/dL — ABNORMAL HIGH (ref 65–99)
Potassium: 4 mmol/L (ref 3.5–5.3)
Sodium: 137 mmol/L (ref 135–146)
Total Bilirubin: 0.6 mg/dL (ref 0.2–1.2)
Total Protein: 6.1 g/dL (ref 6.1–8.1)
eGFR: 70 mL/min/{1.73_m2} (ref >=60)

## 2022-11-25 LAB — HIV-1 RNA QUANT-NO REFLEX-BLD
HIV 1 RNA Quant: NOT DETECTED {copies}/mL
HIV-1 RNA Quant, Log: NOT DETECTED {Log}

## 2022-11-25 LAB — HEPATITIS B DNA, ULTRAQUANTITATIVE, PCR
Hepatitis B DNA: NOT DETECTED [IU]/mL
Hepatitis B virus DNA: NOT DETECTED {Log}

## 2022-11-25 LAB — HIV ANTIBODY (ROUTINE TESTING W REFLEX): HIV 1&2 Ab, 4th Generation: NONREACTIVE

## 2022-12-06 ENCOUNTER — Encounter: Payer: Self-pay | Admitting: Infectious Disease

## 2022-12-06 ENCOUNTER — Other Ambulatory Visit (HOSPITAL_COMMUNITY): Payer: Self-pay

## 2022-12-06 ENCOUNTER — Ambulatory Visit: Payer: Medicare Other | Admitting: Infectious Disease

## 2022-12-06 ENCOUNTER — Other Ambulatory Visit: Payer: Self-pay

## 2022-12-06 VITALS — BP 129/83 | HR 84 | Temp 98.1°F | Ht 68.5 in | Wt 176.0 lb

## 2022-12-06 DIAGNOSIS — Z113 Encounter for screening for infections with a predominantly sexual mode of transmission: Secondary | ICD-10-CM

## 2022-12-06 DIAGNOSIS — B079 Viral wart, unspecified: Secondary | ICD-10-CM

## 2022-12-06 DIAGNOSIS — N5089 Other specified disorders of the male genital organs: Secondary | ICD-10-CM

## 2022-12-06 DIAGNOSIS — Z2981 Encounter for HIV pre-exposure prophylaxis: Secondary | ICD-10-CM | POA: Diagnosis not present

## 2022-12-06 DIAGNOSIS — Z7185 Encounter for immunization safety counseling: Secondary | ICD-10-CM

## 2022-12-06 DIAGNOSIS — B181 Chronic viral hepatitis B without delta-agent: Secondary | ICD-10-CM | POA: Diagnosis not present

## 2022-12-06 DIAGNOSIS — Z79899 Other long term (current) drug therapy: Secondary | ICD-10-CM

## 2022-12-06 DIAGNOSIS — Z23 Encounter for immunization: Secondary | ICD-10-CM

## 2022-12-06 HISTORY — DX: Encounter for HIV pre-exposure prophylaxis: Z29.81

## 2022-12-06 MED ORDER — DESCOVY 200-25 MG PO TABS
1.0000 | ORAL_TABLET | Freq: Every day | ORAL | 11 refills | Status: DC
  Filled 2022-12-07 – 2022-12-17 (×2): qty 30, 30d supply, fill #0
  Filled 2023-01-16: qty 30, 30d supply, fill #1
  Filled 2023-02-08: qty 30, 30d supply, fill #2

## 2022-12-06 MED ORDER — DOXYCYCLINE HYCLATE 100 MG PO TABS
200.0000 mg | ORAL_TABLET | ORAL | 3 refills | Status: AC
  Filled 2022-12-06: qty 60, 30d supply, fill #0
  Filled 2023-09-02: qty 60, 30d supply, fill #1
  Filled 2023-09-25: qty 60, 30d supply, fill #2
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
## 2022-12-07 ENCOUNTER — Other Ambulatory Visit: Payer: Self-pay

## 2022-12-13 ENCOUNTER — Ambulatory Visit (INDEPENDENT_AMBULATORY_CARE_PROVIDER_SITE_OTHER): Payer: Medicare Other | Admitting: Family Medicine

## 2022-12-13 ENCOUNTER — Ambulatory Visit
Admission: RE | Admit: 2022-12-13 | Discharge: 2022-12-13 | Disposition: A | Discharge status: Home / Self Care | Payer: Medicare Other | Source: Ambulatory Visit | Attending: Infectious Disease | Admitting: Infectious Disease

## 2022-12-13 ENCOUNTER — Encounter: Payer: Self-pay | Admitting: Family Medicine

## 2022-12-13 VITALS — BP 134/74 | HR 76 | Temp 98.3°F | Ht 68.5 in | Wt 177.0 lb

## 2022-12-13 DIAGNOSIS — G4730 Sleep apnea, unspecified: Secondary | ICD-10-CM

## 2022-12-13 DIAGNOSIS — E119 Type 2 diabetes mellitus without complications: Secondary | ICD-10-CM | POA: Diagnosis not present

## 2022-12-13 DIAGNOSIS — L989 Disorder of the skin and subcutaneous tissue, unspecified: Secondary | ICD-10-CM

## 2022-12-13 DIAGNOSIS — Z7984 Long term (current) use of oral hypoglycemic drugs: Secondary | ICD-10-CM

## 2022-12-13 DIAGNOSIS — Z1289 Encounter for screening for malignant neoplasm of other sites: Secondary | ICD-10-CM | POA: Diagnosis not present

## 2022-12-13 DIAGNOSIS — B181 Chronic viral hepatitis B without delta-agent: Secondary | ICD-10-CM

## 2022-12-13 NOTE — Progress Notes (Signed)
Sugar has been ~ 130-140, up to 190s recently.  No lows.  A1c 6.9.  Still on metformin and Actos.  Scalp lesions can get irritated.  Present for a few months.  See exam.  He is waking up gasping for air.  Daytime drowsiness.   Meds, vitals, and allergies reviewed.   ROS: Per HPI unless specifically indicated in ROS section   Nad Ncat but R posterior scalp 1cm AK and L posterior scalp 0.5cm AK.  No ulceration.  No spreading erythema. Neck supple, no LA Rrr Ctab

## 2022-12-13 NOTE — Patient Instructions (Addendum)
Likely actinic keratosis.  Reasonable to freeze with liquid nitrogen- let me know if any bigger or more irritated.  Let me know if you don't get a call about seeing pulmonary.  Take care.  Glad to see you.

## 2022-12-16 DIAGNOSIS — G4730 Sleep apnea, unspecified: Secondary | ICD-10-CM | POA: Insufficient documentation

## 2022-12-16 DIAGNOSIS — L989 Disorder of the skin and subcutaneous tissue, unspecified: Secondary | ICD-10-CM | POA: Insufficient documentation

## 2022-12-16 NOTE — Assessment & Plan Note (Signed)
Most recent A1c 6.9.  Recheck periodically.  Continue metformin and Actos.

## 2022-12-16 NOTE — Assessment & Plan Note (Signed)
Refer to pulmonary

## 2022-12-16 NOTE — Assessment & Plan Note (Signed)
Likely actinic keratosis.  Discussed that these can be precancerous.  Reasonable to freeze with liquid nitrogen-discussed options, he wanted to defer treatment at this point.  He can let me know if any bigger or more irritated.  We can treat in the future.

## 2022-12-17 ENCOUNTER — Other Ambulatory Visit (HOSPITAL_COMMUNITY): Payer: Self-pay

## 2022-12-17 ENCOUNTER — Other Ambulatory Visit: Payer: Self-pay

## 2022-12-17 ENCOUNTER — Other Ambulatory Visit (HOSPITAL_COMMUNITY): Payer: Self-pay | Admitting: Pharmacy Technician

## 2022-12-17 NOTE — Progress Notes (Signed)
Specialty Pharmacy Refill Coordination Note  Jacob Ponce is a 82 y.o. male contacted today regarding refills of specialty medication(s) Emtricitabine-Tenofovir Af   Patient requested Delivery   Delivery date: 12/27/22   Verified address: [address]  [place]   Medication will be filled on 12/26/22.

## 2023-01-01 ENCOUNTER — Other Ambulatory Visit: Payer: Self-pay | Admitting: Family Medicine

## 2023-01-01 DIAGNOSIS — H35363 Drusen (degenerative) of macula, bilateral: Secondary | ICD-10-CM | POA: Diagnosis not present

## 2023-01-01 DIAGNOSIS — H02831 Dermatochalasis of right upper eyelid: Secondary | ICD-10-CM | POA: Diagnosis not present

## 2023-01-01 DIAGNOSIS — H353132 Nonexudative age-related macular degeneration, bilateral, intermediate dry stage: Secondary | ICD-10-CM | POA: Diagnosis not present

## 2023-01-01 DIAGNOSIS — Z7984 Long term (current) use of oral hypoglycemic drugs: Secondary | ICD-10-CM | POA: Diagnosis not present

## 2023-01-01 DIAGNOSIS — H16223 Keratoconjunctivitis sicca, not specified as Sjogren's, bilateral: Secondary | ICD-10-CM | POA: Diagnosis not present

## 2023-01-01 DIAGNOSIS — E119 Type 2 diabetes mellitus without complications: Secondary | ICD-10-CM | POA: Diagnosis not present

## 2023-01-01 DIAGNOSIS — H2513 Age-related nuclear cataract, bilateral: Secondary | ICD-10-CM | POA: Diagnosis not present

## 2023-01-01 DIAGNOSIS — H5203 Hypermetropia, bilateral: Secondary | ICD-10-CM | POA: Diagnosis not present

## 2023-01-01 DIAGNOSIS — H43393 Other vitreous opacities, bilateral: Secondary | ICD-10-CM | POA: Diagnosis not present

## 2023-01-01 DIAGNOSIS — H3554 Dystrophies primarily involving the retinal pigment epithelium: Secondary | ICD-10-CM | POA: Diagnosis not present

## 2023-01-01 DIAGNOSIS — H524 Presbyopia: Secondary | ICD-10-CM | POA: Diagnosis not present

## 2023-01-01 DIAGNOSIS — H02834 Dermatochalasis of left upper eyelid: Secondary | ICD-10-CM | POA: Diagnosis not present

## 2023-01-01 DIAGNOSIS — N401 Enlarged prostate with lower urinary tract symptoms: Secondary | ICD-10-CM

## 2023-01-01 DIAGNOSIS — Z83511 Family history of glaucoma: Secondary | ICD-10-CM | POA: Diagnosis not present

## 2023-01-09 ENCOUNTER — Other Ambulatory Visit: Payer: Self-pay

## 2023-01-16 ENCOUNTER — Other Ambulatory Visit: Payer: Self-pay

## 2023-01-16 NOTE — Progress Notes (Signed)
Specialty Pharmacy Ongoing Clinical Assessment Note  Jacob Ponce is a 82 y.o. male who is being followed by the specialty pharmacy service for RxSp HIV PrEP   Patient's specialty medication(s) reviewed today: Emtricitabine-Tenofovir Af   Missed doses in the last 4 weeks: 0   Patient/Caregiver did not have any additional questions or concerns.   Therapeutic benefit summary: Patient is achieving benefit   Adverse events/side effects summary: No adverse events/side effects   Patient's therapy is appropriate to: Continue    Goals Addressed             This Visit's Progress    Maintain optimal adherence to therapy       Patient is on track. Patient will maintain adherence. Per provider notes from 12/06/22 patient remains HIV negative and Hepatitis B remains undetectable.          Follow up:  6 months  Otto Herb Specialty Pharmacist

## 2023-01-16 NOTE — Progress Notes (Signed)
Specialty Pharmacy Refill Coordination Note  Jacob Ponce is a 82 y.o. male contacted today regarding refills of specialty medication(s) Emtricitabine-Tenofovir Af   Patient requested Delivery   Delivery date: 01/22/23   Verified address: [address]  [place]   Medication will be filled on 01/21/23.

## 2023-01-21 ENCOUNTER — Other Ambulatory Visit: Payer: Self-pay

## 2023-02-08 ENCOUNTER — Other Ambulatory Visit (HOSPITAL_COMMUNITY): Payer: Self-pay | Admitting: Pharmacy Technician

## 2023-02-08 ENCOUNTER — Other Ambulatory Visit (HOSPITAL_COMMUNITY): Payer: Self-pay

## 2023-02-08 NOTE — Progress Notes (Signed)
 Specialty Pharmacy Refill Coordination Note  Jacob Ponce is a 83 y.o. male contacted today regarding refills of specialty medication(s) Emtricitabine -Tenofovir  AF (Descovy )   Patient requested Delivery   Delivery date: 02/21/23   Verified address: Patient address [address]  [place]   Medication will be filled on 02/20/23.

## 2023-02-20 ENCOUNTER — Other Ambulatory Visit: Payer: Self-pay

## 2023-02-21 ENCOUNTER — Other Ambulatory Visit (HOSPITAL_COMMUNITY): Payer: Self-pay

## 2023-02-25 ENCOUNTER — Other Ambulatory Visit: Payer: Self-pay | Admitting: Infectious Disease

## 2023-02-25 ENCOUNTER — Other Ambulatory Visit: Payer: Self-pay

## 2023-02-25 ENCOUNTER — Other Ambulatory Visit (HOSPITAL_COMMUNITY): Payer: Self-pay

## 2023-02-25 MED ORDER — EMTRICITABINE-TENOFOVIR DF 200-300 MG PO TABS
1.0000 | ORAL_TABLET | Freq: Every day | ORAL | 11 refills | Status: DC
  Filled 2023-02-25: qty 30, 30d supply, fill #0
  Filled 2023-03-18: qty 30, 30d supply, fill #1
  Filled 2023-04-19 (×2): qty 30, 30d supply, fill #2
  Filled 2023-05-08: qty 30, 30d supply, fill #3

## 2023-02-25 NOTE — Progress Notes (Signed)
Specialty Pharmacy Refill Coordination Note  ANSHUL FUSON is a 83 y.o. male assessed today regarding refills of clinic administered specialty medication(s) Emtricitabine-Tenofovir DF (TRUVADA)   Clinic requested Delivery   Delivery date: 02/27/23   Verified address: Patient address [address]  [place] [address]   Medication will be filled on 02/26/23.  Patient is aware of copay $72.48

## 2023-02-26 ENCOUNTER — Other Ambulatory Visit: Payer: Self-pay

## 2023-02-26 ENCOUNTER — Other Ambulatory Visit (HOSPITAL_COMMUNITY): Payer: Self-pay

## 2023-02-28 ENCOUNTER — Other Ambulatory Visit (HOSPITAL_COMMUNITY): Payer: Self-pay

## 2023-03-01 ENCOUNTER — Other Ambulatory Visit (HOSPITAL_COMMUNITY): Payer: Self-pay

## 2023-03-01 ENCOUNTER — Telehealth: Payer: Self-pay

## 2023-03-01 NOTE — Telephone Encounter (Signed)
[facility] Patient Advocate Encounter   I was successful in securing patient a $ 2,000.00 grant from [employer] to provide copayment coverage for Truvada.  The patient's out of pocket cost will be $5.00 monthly.     I have spoken with the patient.    The billing information is as follows and has been shared with [facility].            Dates of Eligibility: 02/28/23 through 02/05/24  Patient knows to call the office with questions or concerns.  Clearance Coots, CPhT Specialty Pharmacy Patient [facility] Phone: [phone] Fax:  [phone]

## 2023-03-13 ENCOUNTER — Other Ambulatory Visit (HOSPITAL_COMMUNITY): Payer: Self-pay

## 2023-03-18 ENCOUNTER — Other Ambulatory Visit (HOSPITAL_COMMUNITY): Payer: Self-pay

## 2023-03-18 ENCOUNTER — Other Ambulatory Visit (HOSPITAL_COMMUNITY): Payer: Self-pay | Admitting: Pharmacy Technician

## 2023-03-18 NOTE — Progress Notes (Signed)
 Specialty Pharmacy Refill Coordination Note  Jacob Ponce is a 83 y.o. male contacted today regarding refills of specialty medication(s) Emtricitabine -Tenofovir  DF (TRUVADA )   Patient requested Delivery   Delivery date: 03/26/23   Verified address: Patient address [address]  [place]   Medication will be filled on 03/25/23.

## 2023-03-19 ENCOUNTER — Encounter: Payer: Self-pay | Admitting: Infectious Disease

## 2023-03-19 DIAGNOSIS — Z113 Encounter for screening for infections with a predominantly sexual mode of transmission: Secondary | ICD-10-CM | POA: Insufficient documentation

## 2023-03-19 HISTORY — DX: Encounter for screening for infections with a predominantly sexual mode of transmission: Z11.3

## 2023-03-20 ENCOUNTER — Ambulatory Visit: Payer: Medicare Other | Admitting: Infectious Disease

## 2023-03-20 DIAGNOSIS — B181 Chronic viral hepatitis B without delta-agent: Secondary | ICD-10-CM

## 2023-03-20 DIAGNOSIS — Z79899 Other long term (current) drug therapy: Secondary | ICD-10-CM

## 2023-03-20 DIAGNOSIS — Z113 Encounter for screening for infections with a predominantly sexual mode of transmission: Secondary | ICD-10-CM

## 2023-03-25 ENCOUNTER — Other Ambulatory Visit: Payer: Self-pay

## 2023-04-04 ENCOUNTER — Other Ambulatory Visit: Payer: Self-pay | Admitting: Family Medicine

## 2023-04-17 ENCOUNTER — Other Ambulatory Visit (HOSPITAL_COMMUNITY): Payer: Self-pay

## 2023-04-19 ENCOUNTER — Other Ambulatory Visit: Payer: Self-pay

## 2023-04-19 ENCOUNTER — Other Ambulatory Visit (HOSPITAL_COMMUNITY): Payer: Self-pay

## 2023-04-19 NOTE — Progress Notes (Signed)
 Specialty Pharmacy Refill Coordination Note  Jacob Ponce is a 83 y.o. male contacted today regarding refills of specialty medication(s) Emtricitabine-Tenofovir DF (TRUVADA)   Patient requested Delivery   Delivery date: 04/24/23   Verified address: [address]   [place] [id number]   Medication will be filled on 04/23/23.

## 2023-04-23 ENCOUNTER — Other Ambulatory Visit: Payer: Self-pay

## 2023-04-24 ENCOUNTER — Other Ambulatory Visit: Payer: Self-pay

## 2023-04-24 ENCOUNTER — Other Ambulatory Visit

## 2023-04-24 ENCOUNTER — Other Ambulatory Visit (HOSPITAL_COMMUNITY)
Admission: RE | Admit: 2023-04-24 | Discharge: 2023-04-24 | Disposition: A | Discharge status: Home / Self Care | Source: Ambulatory Visit | Attending: Infectious Disease | Admitting: Infectious Disease

## 2023-04-24 DIAGNOSIS — Z2981 Encounter for HIV pre-exposure prophylaxis: Secondary | ICD-10-CM

## 2023-04-24 DIAGNOSIS — Z113 Encounter for screening for infections with a predominantly sexual mode of transmission: Secondary | ICD-10-CM | POA: Diagnosis present

## 2023-04-24 DIAGNOSIS — B181 Chronic viral hepatitis B without delta-agent: Secondary | ICD-10-CM | POA: Insufficient documentation

## 2023-04-24 DIAGNOSIS — Z7185 Encounter for immunization safety counseling: Secondary | ICD-10-CM | POA: Insufficient documentation

## 2023-04-25 LAB — CYTOLOGY, (ORAL, ANAL, URETHRAL) ANCILLARY ONLY
Chlamydia: NEGATIVE
Chlamydia: NEGATIVE
Comment: NEGATIVE
Comment: NEGATIVE
Comment: NORMAL
Comment: NORMAL
Neisseria Gonorrhea: NEGATIVE
Neisseria Gonorrhea: NEGATIVE

## 2023-04-25 LAB — URINE CYTOLOGY ANCILLARY ONLY
Chlamydia: NEGATIVE
Comment: NEGATIVE
Comment: NORMAL
Neisseria Gonorrhea: NEGATIVE

## 2023-04-26 LAB — COMPLETE METABOLIC PANEL WITH GFR
AG Ratio: 1.8 (calc) (ref 1.0–2.5)
ALT: 15 U/L (ref 9–46)
AST: 17 U/L (ref 10–35)
Albumin: 4.3 g/dL (ref 3.6–5.1)
Alkaline phosphatase (APISO): 47 U/L (ref 35–144)
BUN: 23 mg/dL (ref 7–25)
CO2: 25 mmol/L (ref 20–32)
Calcium: 10.1 mg/dL (ref 8.6–10.3)
Chloride: 102 mmol/L (ref 98–110)
Creat: 0.95 mg/dL (ref 0.70–1.22)
Globulin: 2.4 g/dL (ref 1.9–3.7)
Glucose, Bld: 227 mg/dL — ABNORMAL HIGH (ref 65–99)
Potassium: 3.9 mmol/L (ref 3.5–5.3)
Sodium: 139 mmol/L (ref 135–146)
Total Bilirubin: 0.9 mg/dL (ref 0.2–1.2)
Total Protein: 6.7 g/dL (ref 6.1–8.1)

## 2023-04-26 LAB — HIV ANTIBODY (ROUTINE TESTING W REFLEX): HIV 1&2 Ab, 4th Generation: NONREACTIVE

## 2023-04-26 LAB — RPR: RPR Ser Ql: NONREACTIVE

## 2023-04-26 LAB — HIV-1 RNA QUANT-NO REFLEX-BLD
HIV 1 RNA Quant: NOT DETECTED {copies}/mL
HIV-1 RNA Quant, Log: NOT DETECTED {Log_copies}/mL

## 2023-05-08 ENCOUNTER — Other Ambulatory Visit: Payer: Self-pay

## 2023-05-08 ENCOUNTER — Encounter: Payer: Self-pay | Admitting: Infectious Disease

## 2023-05-08 ENCOUNTER — Ambulatory Visit: Admitting: Infectious Disease

## 2023-05-08 VITALS — BP 132/85 | HR 83 | Temp 97.7°F | Ht 68.0 in | Wt 175.0 lb

## 2023-05-08 DIAGNOSIS — Z2981 Encounter for HIV pre-exposure prophylaxis: Secondary | ICD-10-CM | POA: Diagnosis not present

## 2023-05-08 DIAGNOSIS — B181 Chronic viral hepatitis B without delta-agent: Secondary | ICD-10-CM

## 2023-05-08 DIAGNOSIS — Z113 Encounter for screening for infections with a predominantly sexual mode of transmission: Secondary | ICD-10-CM

## 2023-05-08 MED ORDER — EMTRICITABINE-TENOFOVIR DF 200-300 MG PO TABS
1.0000 | ORAL_TABLET | Freq: Every day | ORAL | 11 refills | Status: DC
  Filled 2023-05-08: qty 30, 30d supply, fill #0
  Filled 2023-06-07 – 2023-06-28 (×2): qty 30, 30d supply, fill #1

## 2023-05-08 NOTE — Progress Notes (Signed)
 Subjective:  Chief complaint: follow-up for HIV disease on medications   Patient ID: Jacob Ponce, male    DOB: [birth year], 83 y.o.   MRN: [id number]  HPI  Discussed the use of AI scribe software for clinical note transcription with the patient, who gave verbal consent to proceed.  History of Present Illness   The patient, with a history of Hepatitis B, presents for a routine follow-up for hepatitis B and for HIV PrEP.  He reports a recent change in his relationship status, with his previous partner having left due to a sexually transmitted infection. Currently, he is not in a relationship and has no new partners. He has been switched from Descovy to Truvada due to insurance requirements. He reports no issues with the new medication.  The patient also mentions a former acquaintance, who has been sending threatening messages. This individual is reportedly struggling to afford his HIV medication. The patient has advised him to seek help at the clinic if any one asks him for advice. Apparently this individual is asking for $ to pay for his ARV.  The patient has been prescribed doxycycline to take after sex and has not yet needed to refill this prescription. .       Past Medical History:  Diagnosis Date   BPH (benign prostatic hyperplasia)    Diabetes mellitus    Type II, controlled   Encounter for HIV pre-exposure prophylaxis 12/06/2022   Fracture 2011   Right proximal humerus, resolved w/o deficit   HBV (hepatitis B virus) infection    History of cardiac cath 1999   and Tilt Table Test, both negative   Hyperlipidemia    Hypertension    Impotence of organic origin    Intertrigo 08/10/2020   Keloid 08/04/2021   On pre-exposure prophylaxis for HIV 02/10/2020   Routine screening for STI (sexually transmitted infection) 03/19/2023   Special screening for malignant neoplasms of other sites     Past Surgical History:  Procedure Laterality Date   APPENDECTOMY     TIBIA FRACTURE  SURGERY     Left, resolved without deficit   TONSILLECTOMY      Family History  Problem Relation Age of Onset   Cancer Father        colon   Seizures Father        after head injury   Colon cancer Father    Colon cancer Mother    Arthritis Brother    Prostate cancer Neg Hx       Social History   Socioeconomic History   Marital status: Married    Spouse name: Not on file   Number of children: 4   Years of education: Not on file   Highest education level: Not on file  Occupational History   Occupation: Art gallery manager at [employer]    Employer: [employer]   Occupation: Pilot  Tobacco Use   Smoking status: Former    Current packs/day: 0.00    Types: Cigarettes    Quit date: 02/05/1985    Years since quitting: 38.2   Smokeless tobacco: Never   Tobacco comments:    Smoked from 1966 to 1987  Substance and Sexual Activity   Alcohol use: No    Alcohol/week: 0.0 standard drinks of alcohol   Drug use: No   Sexual activity: Never    Comment: declined condoms  Other Topics Concern   Not on file  Social History Narrative   Undergrad and Masters at [employer].  Second marriage since 1987   4 children, 3 step-children   Flew small aircraft- retired from flying 2019   Retired 2023   Social Drivers of [employer]: Low Risk  (05/16/2022)   Overall Financial Resource Strain (CARDIA)    Difficulty of Paying Living Expenses: Not hard at all  Food Insecurity: No Food Insecurity (05/16/2022)   Hunger Vital Sign    Worried About Running Out of Food in the Last Year: Never true    Ran Out of Food in the Last Year: Never true  Transportation Needs: No Transportation Needs (05/16/2022)   PRAPARE - Administrator, Civil Service (Medical): No    Lack of Transportation (Non-Medical): No  Physical Activity: Inactive (05/16/2022)   Exercise Vital Sign    Days of Exercise per Week: 0 days    Minutes of Exercise per Session: 0 min  Stress: No Stress Concern  Present (05/16/2022)   [employer] of Occupational Health - Occupational Stress Questionnaire    Feeling of Stress : Not at all  Social Connections: Socially Integrated (05/16/2022)   Social Connection and Isolation Panel [NHANES]    Frequency of Communication with Friends and Family: More than three times a week    Frequency of Social Gatherings with Friends and Family: More than three times a week    Attends Religious Services: More than 4 times per year    Active Member of [employer] or Organizations: Yes    Attends Engineer, structural: More than 4 times per year    Marital Status: Married    Allergies  Allergen Reactions   Alcohol Other (See Comments)   Cat Dander Other (See Comments)   Pollen Extract Other (See Comments)   Tamsulosin     Would avoid based on ophthalmology recommendation.     Current Outpatient Medications:    aspirin 81 MG tablet, Take 81 mg by mouth daily., Disp: , Rfl:    Cholecalciferol (VITAMIN D3) 50 MCG (2000 UT) capsule, Take 1 capsule (2,000 Units total) by mouth daily., Disp: , Rfl:    doxazosin (CARDURA) 8 MG tablet, TAKE 1 TABLET BY MOUTH AT  BEDTIME, Disp: 100 tablet, Rfl: 2   doxycycline (VIBRA-TABS) 100 MG tablet, Take 2 tablets (200 mg total) by mouth as directed after sex to prevent STI, Disp: 60 tablet, Rfl: 3   emtricitabine-tenofovir (TRUVADA) 200-300 MG tablet, Take 1 tablet by mouth daily., Disp: 30 tablet, Rfl: 11   metFORMIN (GLUCOPHAGE) 500 MG tablet, TAKE 2 TABLETS BY MOUTH IN THE  MORNING AND 1 TABLET BY MOUTH IN THE EVENING WITH MEALS, Disp: 300 tablet, Rfl: 0   Multiple Vitamins-Minerals (PRESERVISION AREDS 2 PO), Take by mouth in the morning and at bedtime., Disp: , Rfl:    ONETOUCH ULTRA test strip, USE TO CHECK BLOOD SUGAR DAILY, Disp: 100 strip, Rfl: 2   OneTouch UltraSoft 2 Lancets MISC, USE TO CHECK BLOOD SUGAR DAILY, Disp: 100 each, Rfl: 2   pioglitazone (ACTOS) 30 MG tablet, TAKE ONE-HALF TABLET BY MOUTH  DAILY, Disp:  50 tablet, Rfl: 0   simvastatin (ZOCOR) 40 MG tablet, TAKE 1 TABLET BY MOUTH AT  BEDTIME, Disp: 100 tablet, Rfl: 2   emtricitabine-tenofovir AF (DESCOVY) 200-25 MG tablet, Take 1 tablet by mouth daily. (Patient not taking: Reported on 05/08/2023), Disp: 30 tablet, Rfl: 11   loratadine (CLARITIN) 10 MG tablet, Take 10 mg by mouth daily as needed for allergies. (Patient not taking: Reported on 05/08/2023),  Disp: , Rfl:     Review of Systems  Constitutional:  Negative for activity change, appetite change, chills, diaphoresis, fatigue, fever and unexpected weight change.  HENT:  Negative for congestion, rhinorrhea, sinus pressure, sneezing, sore throat and trouble swallowing.   Eyes:  Negative for photophobia and visual disturbance.  Respiratory:  Negative for cough, chest tightness, shortness of breath, wheezing and stridor.   Cardiovascular:  Negative for chest pain, palpitations and leg swelling.  Gastrointestinal:  Negative for abdominal distention, abdominal pain, anal bleeding, blood in stool, constipation, diarrhea, nausea and vomiting.  Genitourinary:  Negative for difficulty urinating, dysuria, flank pain and hematuria.  Musculoskeletal:  Negative for arthralgias, back pain, gait problem, joint swelling and myalgias.  Skin:  Negative for color change, pallor, rash and wound.  Neurological:  Negative for dizziness, tremors, weakness and light-headedness.  Hematological:  Negative for adenopathy. Does not bruise/bleed easily.  Psychiatric/Behavioral:  Negative for agitation, behavioral problems, confusion, decreased concentration, dysphoric mood and sleep disturbance.        Objective:   Physical Exam Constitutional:      Appearance: He is well-developed.  HENT:     Head: Normocephalic and atraumatic.  Eyes:     Conjunctiva/sclera: Conjunctivae normal.  Cardiovascular:     Rate and Rhythm: Normal rate and regular rhythm.  Pulmonary:     Effort: Pulmonary effort is normal. No  respiratory distress.     Breath sounds: No wheezing.  Abdominal:     General: There is no distension.     Palpations: Abdomen is soft.  Musculoskeletal:        General: No tenderness. Normal range of motion.     Cervical back: Normal range of motion and neck supple.  Skin:    General: Skin is warm and dry.     Coloration: Skin is not pale.     Findings: No erythema or rash.  Neurological:     General: No focal deficit present.     Mental Status: He is alert and oriented to person, place, and time.  Psychiatric:        Mood and Affect: Mood normal.        Behavior: Behavior normal.        Thought Content: Thought content normal.        Judgment: Judgment normal.           Assessment & Plan:   Assessment and Plan    HIV prophylaxis On Truvada for HIV prophylaxis. Labs show no STIs or HIV. Adherence to Truvada crucial to prevent hepatitis B flare-ups. - Send prescription for Truvada to [facility]. - Advise strict adherence to Truvada.  Hepatitis B Previous negative hepatitis B DNA test. Normal liver ultrasound in November. Continuous monitoring  and treatment with Truvada - Order liver ultrasound for May or June with AFP to screen for [facility] - Monitor hepatitis B status regularly.  Follow-up Regular follow-up needed to monitor condition and medication adherence. - Schedule follow-up appointment in June. - Arrange labs on the same day as follow-up.

## 2023-05-08 NOTE — Progress Notes (Signed)
 Specialty Pharmacy Refill Coordination Note  Jacob Ponce is a 83 y.o. male contacted today regarding refills of specialty medication(s) Emtricitabine-Tenofovir DF (TRUVADA)   Patient requested (Patient-Rptd) Delivery   Delivery date: (Patient-Rptd) 05/27/23   Verified address: (Patient-Rptd) [address], [place]  [address]   Medication will be filled on 05/24/23.

## 2023-05-14 ENCOUNTER — Other Ambulatory Visit: Payer: Self-pay | Admitting: Family Medicine

## 2023-05-20 ENCOUNTER — Ambulatory Visit (INDEPENDENT_AMBULATORY_CARE_PROVIDER_SITE_OTHER): Payer: Medicare Other

## 2023-05-20 VITALS — BP 132/85 | Ht 68.0 in | Wt 177.0 lb

## 2023-05-20 DIAGNOSIS — Z0000 Encounter for general adult medical examination without abnormal findings: Secondary | ICD-10-CM | POA: Diagnosis not present

## 2023-05-20 DIAGNOSIS — Z2821 Immunization not carried out because of patient refusal: Secondary | ICD-10-CM | POA: Diagnosis not present

## 2023-05-20 NOTE — Patient Instructions (Signed)
 Mr. Iovino , Thank you for taking time to come for your Medicare Wellness Visit. I appreciate your ongoing commitment to your health goals. Please review the following plan we discussed and let me know if I can assist you in the future.   Referrals/Orders/Follow-Ups/Clinician Recommendations: follow up   This is a list of the screening recommended for you and due dates:  Health Maintenance  Topic Date Due   Zoster (Shingles) Vaccine (1 of 2) Never done   Eye exam for diabetics  12/27/2022   Yearly kidney health urinalysis for diabetes  05/17/2023   Hemoglobin A1C  05/14/2023   Complete foot exam   05/17/2023   COVID-19 Vaccine (7 - Pfizer risk 2024-25 season) 06/05/2023   Flu Shot  09/06/2023   Yearly kidney function blood test for diabetes  04/23/2024   Medicare Annual Wellness Visit  05/19/2024   Colon Cancer Screening  08/16/2025   DTaP/Tdap/Td vaccine (3 - Td or Tdap) 12/05/2026   Pneumonia Vaccine  Completed   HPV Vaccine  Aged Out   Meningitis B Vaccine  Aged Out   Hepatitis C Screening  Discontinued    Advanced directives: (Declined) Advance directive discussed with you today. Even though you declined this today, please call our office should you change your mind, and we can give you the proper paperwork for you to fill out.  Next Medicare Annual Wellness Visit scheduled for next year: Yes

## 2023-05-20 NOTE — Progress Notes (Signed)
 Because this visit was a virtual/telehealth visit,  certain criteria was not obtained, such a blood pressure, CBG if applicable, and timed get up and go. Any medications not marked as "taking" were not mentioned during the medication reconciliation part of the visit. Any vitals not documented were not able to be obtained due to this being a telehealth visit or patient was unable to self-report a recent blood pressure reading due to a lack of equipment at home via telehealth. Vitals that have been documented are verbally provided by the patient.   Subjective:   Jacob Ponce is a 83 y.o. who presents for a Medicare Wellness preventive visit.  Visit Complete: Virtual I connected with  Jacob Ponce on 05/20/23 by a audio enabled telemedicine application and verified that I am speaking with the correct person using two identifiers.  Patient Location: Home  Provider Location: Home Office  I discussed the limitations of evaluation and management by telemedicine. The patient expressed understanding and agreed to proceed.  Vital Signs: Because this visit was a virtual/telehealth visit, some criteria may be missing or patient reported. Any vitals not documented were not able to be obtained and vitals that have been documented are patient reported.  VideoDeclined- This patient declined Librarian, academic. Therefore the visit was completed with audio only.  Persons Participating in Visit: Patient.  AWV Questionnaire: No: Patient Medicare AWV questionnaire was not completed prior to this visit.  Cardiac Risk Factors include: advanced age (>55men, >12 women);hypertension;male gender;diabetes mellitus;dyslipidemia     Objective:    Today's Vitals   05/20/23 1354  BP: 132/85  Weight: 177 lb (80.3 kg)  Height: 5\' 8"  (1.727 m)   Body mass index is 26.91 kg/m.     05/20/2023    2:04 PM 05/16/2022   12:18 PM 04/24/2021   10:36 AM 10/07/2019   12:05 PM 09/14/2018    5:40  PM 11/07/2017   11:36 AM 09/20/2017    8:47 AM  Advanced Directives  Does Patient Have a Medical Advance Directive? Yes Yes Yes Yes Yes Yes Yes  Type of Estate agent of [place];Living will Healthcare Power of [place];Living will Healthcare Power of [place];Living will Healthcare Power of [place];Living will Healthcare Power of [place];Living will Healthcare Power of [place];Living will Healthcare Power of [place];Living will  Does patient want to make changes to medical advance directive? No - Patient declined        Copy of Healthcare Power of Attorney in Chart? No - copy requested No - copy requested No - copy requested No - copy requested No - copy requested  No - copy requested  Would patient like information on creating a medical advance directive?     No - Patient declined      Current Medications (verified) Outpatient Encounter Medications as of 05/20/2023  Medication Sig   aspirin 81 MG tablet Take 81 mg by mouth daily.   Cholecalciferol (VITAMIN D3) 50 MCG (2000 UT) capsule Take 1 capsule (2,000 Units total) by mouth daily.   doxazosin (CARDURA) 8 MG tablet TAKE 1 TABLET BY MOUTH AT  BEDTIME   doxycycline (VIBRA-TABS) 100 MG tablet Take 2 tablets (200 mg total) by mouth as directed after sex to prevent STI   emtricitabine-tenofovir (TRUVADA) 200-300 MG tablet Take 1 tablet by mouth daily.   metFORMIN (GLUCOPHAGE) 500 MG tablet TAKE 2 TABLETS BY MOUTH IN THE  MORNING AND 1 TABLET BY MOUTH IN THE EVENING WITH MEALS   Multiple Vitamins-Minerals (  PRESERVISION AREDS 2 PO) Take by mouth in the morning and at bedtime.   ONETOUCH ULTRA test strip USE TO CHECK BLOOD SUGAR DAILY   OneTouch UltraSoft 2 Lancets MISC USE TO CHECK BLOOD SUGAR DAILY   pioglitazone (ACTOS) 30 MG tablet TAKE ONE-HALF TABLET BY MOUTH  DAILY   simvastatin (ZOCOR) 40 MG tablet TAKE 1 TABLET BY MOUTH AT  BEDTIME   emtricitabine-tenofovir AF (DESCOVY) 200-25 MG tablet Take 1 tablet by mouth  daily. (Patient not taking: Reported on 05/20/2023)   loratadine (CLARITIN) 10 MG tablet Take 10 mg by mouth daily as needed for allergies. (Patient not taking: Reported on 05/20/2023)   No facility-administered encounter medications on file as of 05/20/2023.    Allergies (verified) Alcohol, Cat dander, Pollen extract, and Tamsulosin   History: Past Medical History:  Diagnosis Date   BPH (benign prostatic hyperplasia)    Diabetes mellitus    Type II, controlled   Encounter for HIV pre-exposure prophylaxis 12/06/2022   Fracture 2011   Right proximal humerus, resolved w/o deficit   HBV (hepatitis B virus) infection    History of cardiac cath 1999   and Tilt Table Test, both negative   Hyperlipidemia    Hypertension    Impotence of organic origin    Intertrigo 08/10/2020   Keloid 08/04/2021   On pre-exposure prophylaxis for HIV 02/10/2020   Routine screening for STI (sexually transmitted infection) 03/19/2023   Special screening for malignant neoplasms of other sites    Past Surgical History:  Procedure Laterality Date   APPENDECTOMY     TIBIA FRACTURE SURGERY     Left, resolved without deficit   TONSILLECTOMY     Family History  Problem Relation Age of Onset   Cancer Father        colon   Seizures Father        after head injury   Colon cancer Father    Colon cancer Mother    Arthritis Brother    Prostate cancer Neg Hx    Social History   Socioeconomic History   Marital status: Married    Spouse name: Not on file   Number of children: 4   Years of education: Not on file   Highest education level: Not on file  Occupational History   Occupation: Art gallery manager at [employer]    Employer: [employer]   Occupation: Pilot  Tobacco Use   Smoking status: Former    Current packs/day: 0.00    Types: Cigarettes    Quit date: 02/05/1985    Years since quitting: 38.3   Smokeless tobacco: Never   Tobacco comments:    Smoked from 1966 to 1987  Substance and Sexual Activity    Alcohol use: No    Alcohol/week: 0.0 standard drinks of alcohol   Drug use: No   Sexual activity: Never    Comment: declined condoms  Other Topics Concern   Not on file  Social History Narrative   Undergrad and Masters at [employer].   Second marriage since 1987   4 children, 3 step-children   Flew small aircraft- retired from flying 2019   Retired 2023   Social Drivers of [employer]: Low Risk  (05/20/2023)   Overall Financial Resource Strain (CARDIA)    Difficulty of Paying Living Expenses: Not hard at all  Food Insecurity: No Food Insecurity (05/20/2023)   Hunger Vital Sign    Worried About Running Out of Food in the Last Year:  Never true    Ran Out of Food in the Last Year: Never true  Transportation Needs: No Transportation Needs (05/20/2023)   PRAPARE - Administrator, Civil Service (Medical): No    Lack of Transportation (Non-Medical): No  Physical Activity: Insufficiently Active (05/20/2023)   Exercise Vital Sign    Days of Exercise per Week: 3 days    Minutes of Exercise per Session: 40 min  Stress: No Stress Concern Present (05/20/2023)   [employer] of Occupational Health - Occupational Stress Questionnaire    Feeling of Stress : Not at all  Social Connections: Socially Integrated (05/20/2023)   Social Connection and Isolation Panel [NHANES]    Frequency of Communication with Friends and Family: More than three times a week    Frequency of Social Gatherings with Friends and Family: More than three times a week    Attends Religious Services: More than 4 times per year    Active Member of [employer] or Organizations: Yes    Attends Engineer, structural: More than 4 times per year    Marital Status: Married    Tobacco Counseling Counseling given: Not Answered Tobacco comments: Smoked from 1966 to 1987    Clinical Intake:  Pre-visit preparation completed: Yes  Pain : No/denies pain     BMI - recorded:  26.91 Nutritional Status: BMI 25 -29 Overweight Nutritional Risks: None Diabetes: Yes CBG done?: No Did pt. bring in CBG monitor from home?: No  Lab Results  Component Value Date   HGBA1C 6.9 (H) 11/13/2022   HGBA1C 7.3 (H) 05/17/2022   HGBA1C 6.9 (H) 09/18/2021     How often do you need to have someone help you when you read instructions, pamphlets, or other written materials from your doctor or pharmacy?: 1 - Never What is the last grade level you completed in school?: MAsters  Interpreter Needed?: No  Information entered by :: Estell Harpin   Activities of Daily Living   Patient Care Team: Joaquim Nam, MD as PCP - General Pernell Dupre, Isabella Bowens, [facility] (Inactive) as Pharmacist (Pharmacist) Daiva Eves, Lisette Grinder, MD as Consulting Physician (Infectious Diseases)  Indicate any recent Medical Services you may have received from other than [facility] providers in the past year (date may be approximate).     Assessment:   This is a routine wellness examination for Purnell.  Hearing/Vision screen Hearing Screening - Comments:: No difficulties hearing  Vision Screening - Comments:: Patient wears glasses    Goals Addressed             This Visit's Progress    Patient Stated       Patient would like to get taxes done       Depression Screen     05/20/2023    2:05 PM 08/07/2022   10:09 AM 06/18/2022   11:39 AM 05/17/2022    3:33 PM 05/16/2022   12:17 PM 11/07/2021   10:52 AM 08/04/2021   11:36 AM  PHQ 2/9 Scores  PHQ - 2 Score 0 0 0 0 0 0 0  PHQ- 9 Score 1  3 2        Fall Risk     05/20/2023    2:03 PM 05/08/2023    3:58 PM 08/07/2022   10:09 AM 06/18/2022   11:39 AM 05/17/2022    3:33 PM  Fall Risk   Falls in the past year? 1 1 0 1 1  Number falls in past yr: 1 0  1 1  Injury with Fall? 0 0  0 0  Risk for fall due to : History of fall(s) No Fall Risks Impaired balance/gait;No Fall Risks Impaired balance/gait Impaired balance/gait;History of fall(s)  Follow up  Falls prevention discussed;Falls evaluation completed Falls evaluation completed Falls evaluation completed Falls evaluation completed Falls evaluation completed;Falls prevention discussed    MEDICARE RISK AT HOME:  Medicare Risk at Home Any stairs in or around the home?: Yes If so, are there any without handrails?: No Home free of loose throw rugs in walkways, pet beds, electrical cords, etc?: Yes Adequate lighting in your home to reduce risk of falls?: Yes Life alert?: No Use of a cane, walker or w/c?: Yes (cane sometimes) Grab bars in the bathroom?: No Shower chair or bench in shower?: Yes Elevated toilet seat or a handicapped toilet?: No  TIMED UP AND GO:  Was the test performed?  No  Cognitive Function: 6CIT completed    10/07/2019   12:11 PM 09/20/2017    8:47 AM 09/12/2016    2:05 PM  MMSE - Mini Mental State Exam  Orientation to time 5 5 5   Orientation to Place 5 5 5   Registration 3 3 3   Attention/ Calculation 5 0 0  Recall 3 3 3   Language- name 2 objects  0 0  Language- repeat 1 1 1   Language- follow 3 step command  3 3  Language- read & follow direction  0 0  Write a sentence  0 0  Copy design  0 0  Total score  20 20        05/20/2023    1:58 PM 05/16/2022   12:25 PM 04/24/2021   10:41 AM  6CIT Screen  What Year? 0 points 0 points 0 points  What month? 0 points 0 points 0 points  What time? 0 points 0 points 0 points  Count back from 20 0 points 0 points 0 points  Months in reverse 0 points 0 points 0 points  Repeat phrase 0 points 2 points 0 points  Total Score 0 points 2 points 0 points    Immunizations Immunization History  Administered Date(s) Administered   Fluad Quad(high Dose 65+) 10/28/2019, 11/25/2020   Fluad Trivalent(High Dose 65+) 12/06/2022   Hepatitis A, Adult 07/30/2019, 02/10/2020   Influenza, High Dose Seasonal PF 03/28/2018, 01/23/2022   PFIZER(Purple Top)SARS-COV-2 Vaccination 03/12/2019, 04/07/2019, 11/21/2019   Pfizer Covid-19  Vaccine Bivalent Booster 73yrs & up 11/25/2020   Pfizer(Comirnaty)Fall Seasonal Vaccine 12 years and older 11/29/2021, 12/06/2022   Pneumococcal Conjugate-13 08/31/2014   Pneumococcal Polysaccharide-23 02/06/2004, 09/18/2011   Respiratory Syncytial Virus Vaccine,Recomb Aduvanted(Arexvy) 01/23/2022   Td 02/06/2004   Tdap 12/04/2016    Screening Tests Health Maintenance  Topic Date Due   Zoster Vaccines- Shingrix (1 of 2) Never done   OPHTHALMOLOGY EXAM  12/27/2022   Diabetic kidney evaluation - Urine ACR  05/17/2023   HEMOGLOBIN A1C  05/14/2023   FOOT EXAM  05/17/2023   COVID-19 Vaccine (7 - Pfizer risk 2024-25 season) 06/05/2023   INFLUENZA VACCINE  09/06/2023   Diabetic kidney evaluation - eGFR measurement  04/23/2024   Medicare Annual Wellness (AWV)  05/19/2024   Colonoscopy  08/16/2025   DTaP/Tdap/Td (3 - Td or Tdap) 12/05/2026   Pneumonia Vaccine 36+ Years old  Completed   HPV VACCINES  Aged Out   Meningococcal B Vaccine  Aged Out   Hepatitis C Screening  Discontinued    Health Maintenance  Health Maintenance Due  Topic  Date Due   Zoster Vaccines- Shingrix (1 of 2) Never done   OPHTHALMOLOGY EXAM  12/27/2022   Diabetic kidney evaluation - Urine ACR  05/17/2023   HEMOGLOBIN A1C  05/14/2023   FOOT EXAM  05/17/2023   Health Maintenance Items Addressed:  Additional Screening:  Vision Screening: Recommended annual ophthalmology exams for early detection of glaucoma and other disorders of the eye.  Dental Screening: Recommended annual dental exams for proper oral hygiene  Community Resource Referral / Chronic Care Management: CRR required this visit?  No   CCM required this visit?  No     Plan:     I have personally reviewed and noted the following in the patient's chart:   Medical and social history Use of alcohol, tobacco or illicit drugs  Current medications and supplements including opioid prescriptions. Patient is not currently taking opioid  prescriptions. Functional ability and status Nutritional status Physical activity Advanced directives List of other physicians Hospitalizations, surgeries, and ER visits in previous 12 months Vitals Screenings to include cognitive, depression, and falls Referrals and appointments  In addition, I have reviewed and discussed with patient certain preventive protocols, quality metrics, and best practice recommendations. A written personalized care plan for preventive services as well as general preventive health recommendations were provided to patient.     Freeda Jerry, [place]   05/20/2023   After Visit Summary: (MyChart) Due to this being a telephonic visit, the after visit summary with patients personalized plan was offered to patient via MyChart   Notes: Nothing significant to report at this time.

## 2023-05-24 ENCOUNTER — Other Ambulatory Visit: Payer: Self-pay

## 2023-06-04 ENCOUNTER — Encounter: Payer: Self-pay | Admitting: Family Medicine

## 2023-06-04 ENCOUNTER — Other Ambulatory Visit (HOSPITAL_COMMUNITY): Payer: Self-pay

## 2023-06-04 ENCOUNTER — Ambulatory Visit (INDEPENDENT_AMBULATORY_CARE_PROVIDER_SITE_OTHER): Admitting: Family Medicine

## 2023-06-04 ENCOUNTER — Telehealth: Payer: Self-pay

## 2023-06-04 VITALS — BP 124/68 | HR 65 | Temp 98.4°F | Ht 68.0 in | Wt 177.6 lb

## 2023-06-04 DIAGNOSIS — L989 Disorder of the skin and subcutaneous tissue, unspecified: Secondary | ICD-10-CM

## 2023-06-04 DIAGNOSIS — E785 Hyperlipidemia, unspecified: Secondary | ICD-10-CM

## 2023-06-04 DIAGNOSIS — Z794 Long term (current) use of insulin: Secondary | ICD-10-CM | POA: Diagnosis not present

## 2023-06-04 DIAGNOSIS — B852 Pediculosis, unspecified: Secondary | ICD-10-CM | POA: Diagnosis not present

## 2023-06-04 DIAGNOSIS — E119 Type 2 diabetes mellitus without complications: Secondary | ICD-10-CM | POA: Diagnosis not present

## 2023-06-04 DIAGNOSIS — Z7984 Long term (current) use of oral hypoglycemic drugs: Secondary | ICD-10-CM

## 2023-06-04 LAB — LIPID PANEL
Cholesterol: 139 mg/dL (ref 0–200)
HDL: 55.6 mg/dL (ref >39.00)
LDL Cholesterol: 68 mg/dL (ref 0–99)
NonHDL: 83.68
Total CHOL/HDL Ratio: 3
Triglycerides: 76 mg/dL (ref 0.0–149.0)
VLDL: 15.2 mg/dL (ref 0.0–40.0)

## 2023-06-04 LAB — MICROALBUMIN / CREATININE URINE RATIO
Creatinine,U: 113.8 mg/dL
Microalb Creat Ratio: 14.8 mg/g (ref 0.0–30.0)
Microalb, Ur: 1.7 mg/dL (ref 0.0–1.9)

## 2023-06-04 LAB — HEMOGLOBIN A1C: Hgb A1c MFr Bld: 7 % — ABNORMAL HIGH (ref 4.6–6.5)

## 2023-06-04 MED ORDER — IVERMECTIN 3 MG PO TABS: ORAL_TABLET | ORAL | 0 refills | Status: AC

## 2023-06-04 NOTE — Progress Notes (Unsigned)
 He had shingles vaccine.  Done [facility]    DM2.  Lipids and A1c pending.  Sugar this AM 167.   No ADE on actos  and metformin .  D/w pt about getting eye exam done.  He is scheduled.   Elevated Cholesterol: Using medications without problems: yes Muscle aches: no Diet compliance: d/w pt.  Exercise: d/w pt.   Skin lesion on the R buttock.  Getting larger.  Skin tag.   He saw nits in groin. Noted about 1-2 weeks ago.  Still present.  Itching.  He had exposure, d/w pt.    AKs on the scalp.    Meds, vitals, and allergies reviewed.   ROS: Per HPI unless specifically indicated in ROS section

## 2023-06-04 NOTE — Telephone Encounter (Signed)
 Clinical questions have been answered and PA submitted. PA currently Pending.

## 2023-06-04 NOTE — Patient Instructions (Addendum)
 Go to the lab on the way out.   If you have mychart we'll likely use that to update you.    Take care.  Glad to see you. Appointment when possible get the skin spots addressed.

## 2023-06-04 NOTE — Telephone Encounter (Signed)
 Pharmacy Patient Advocate Encounter   Received notification from CoverMyMeds that prior authorization for Ivermectin  3MG  tablets is required/requested.   Insurance verification completed.   The patient is insured through [facility] .   Per test claim: PA required; PA started via CoverMyMeds. KEY [id number] . Waiting for clinical questions to populate.

## 2023-06-05 DIAGNOSIS — B852 Pediculosis, unspecified: Secondary | ICD-10-CM | POA: Insufficient documentation

## 2023-06-05 NOTE — Assessment & Plan Note (Signed)
 No change in meds.  Continue metformin  and Actos .  See notes on labs.

## 2023-06-05 NOTE — Assessment & Plan Note (Signed)
 Presumed, treat with ivermectin .  Routine cautions given to patient.  Update me as needed.

## 2023-06-05 NOTE — Assessment & Plan Note (Signed)
 Return for scalp treatment with liquid nitrogen for actinic keratoses.  We may be able to freeze the skin tag on his buttock concurrently.  Discussed.

## 2023-06-05 NOTE — Telephone Encounter (Signed)
 Pharmacy Patient Advocate Encounter  Received notification from [employer] that Prior Authorization for Ivermectin  3MG  tablets  has been APPROVED from 06/04/2023 to 07/04/2023   PA #/Case ID/Reference #: [id number]

## 2023-06-05 NOTE — Assessment & Plan Note (Signed)
Continue simvastatin.  See notes on labs. 

## 2023-06-05 NOTE — Telephone Encounter (Signed)
 Patient notified

## 2023-06-07 ENCOUNTER — Other Ambulatory Visit: Payer: Self-pay

## 2023-06-07 ENCOUNTER — Other Ambulatory Visit (HOSPITAL_COMMUNITY): Payer: Self-pay

## 2023-06-07 NOTE — Progress Notes (Signed)
 Patient called back and was able to locate package in his home.

## 2023-06-07 NOTE — Progress Notes (Signed)
 Patient called request refill but its too early, however medication was delivered 21st of April.  If patient calls back have them look for package. Left voicemail for patient to callback.

## 2023-06-09 ENCOUNTER — Encounter: Payer: Self-pay | Admitting: Family Medicine

## 2023-06-15 ENCOUNTER — Other Ambulatory Visit: Payer: Self-pay | Admitting: Family Medicine

## 2023-06-21 ENCOUNTER — Encounter: Payer: Self-pay | Admitting: Family Medicine

## 2023-06-26 ENCOUNTER — Other Ambulatory Visit: Payer: Self-pay

## 2023-06-28 ENCOUNTER — Other Ambulatory Visit (HOSPITAL_COMMUNITY): Payer: Self-pay

## 2023-06-28 ENCOUNTER — Other Ambulatory Visit: Payer: Self-pay

## 2023-06-28 NOTE — Progress Notes (Signed)
 Specialty Pharmacy Refill Coordination Note  Jacob Ponce is a 83 y.o. male contacted today regarding refills of specialty medication(s) Emtricitabine -Tenofovir  DF (TRUVADA )   Patient requested Delivery   Delivery date: 07/08/23   Verified address: [address], [place], [address]   Medication will be filled on 07/05/23.

## 2023-06-29 ENCOUNTER — Other Ambulatory Visit: Payer: Self-pay | Admitting: Family Medicine

## 2023-07-02 DIAGNOSIS — H353132 Nonexudative age-related macular degeneration, bilateral, intermediate dry stage: Secondary | ICD-10-CM | POA: Diagnosis not present

## 2023-07-02 DIAGNOSIS — H04123 Dry eye syndrome of bilateral lacrimal glands: Secondary | ICD-10-CM | POA: Diagnosis not present

## 2023-07-02 DIAGNOSIS — H35363 Drusen (degenerative) of macula, bilateral: Secondary | ICD-10-CM | POA: Diagnosis not present

## 2023-07-02 DIAGNOSIS — H3554 Dystrophies primarily involving the retinal pigment epithelium: Secondary | ICD-10-CM | POA: Diagnosis not present

## 2023-07-05 ENCOUNTER — Other Ambulatory Visit: Payer: Self-pay

## 2023-07-08 ENCOUNTER — Other Ambulatory Visit: Payer: Self-pay

## 2023-07-08 ENCOUNTER — Other Ambulatory Visit: Payer: Self-pay | Admitting: Family Medicine

## 2023-07-08 ENCOUNTER — Other Ambulatory Visit

## 2023-07-12 ENCOUNTER — Other Ambulatory Visit: Payer: Self-pay

## 2023-07-12 NOTE — Progress Notes (Signed)
 Specialty Pharmacy Ongoing Clinical Assessment Note  MONTRAY KLIEBERT is a 83 y.o. male who is being followed by the specialty pharmacy service for RxSp HIV PrEP   Patient's specialty medication(s) reviewed today: Emtricitabine -Tenofovir  DF (TRUVADA )   Missed doses in the last 4 weeks: 0   Patient/Caregiver did not have any additional questions or concerns.   Therapeutic benefit summary: Patient is achieving benefit   Adverse events/side effects summary: No adverse events/side effects   Patient's therapy is appropriate to: Continue    Goals Addressed             This Visit's Progress    Maintain optimal adherence to therapy   On track    Patient is on track. Patient will maintain adherence. Per provider notes from 05/08/23 patient remains HIV negative and Hepatitis B remains undetectable.   Patient states he has moved to [place] . He will maintained residence in [place] until end of July and then will transition care to [facility].         Follow up: 6 months  [facility]

## 2023-07-23 ENCOUNTER — Other Ambulatory Visit: Payer: Self-pay | Admitting: Family Medicine
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
## 2023-07-24 ENCOUNTER — Encounter: Payer: Self-pay | Admitting: Infectious Disease

## 2023-07-24 ENCOUNTER — Other Ambulatory Visit: Payer: Self-pay

## 2023-07-24 ENCOUNTER — Ambulatory Visit: Admitting: Infectious Disease

## 2023-07-24 ENCOUNTER — Other Ambulatory Visit (HOSPITAL_COMMUNITY)
Admission: RE | Admit: 2023-07-24 | Discharge: 2023-07-24 | Disposition: A | Discharge status: Home / Self Care | Source: Ambulatory Visit | Attending: Infectious Disease | Admitting: Infectious Disease

## 2023-07-24 VITALS — BP 107/83 | HR 92 | Temp 97.9°F | Ht 68.0 in | Wt 179.0 lb

## 2023-07-24 DIAGNOSIS — Z2981 Encounter for HIV pre-exposure prophylaxis: Secondary | ICD-10-CM | POA: Insufficient documentation

## 2023-07-24 DIAGNOSIS — B181 Chronic viral hepatitis B without delta-agent: Secondary | ICD-10-CM

## 2023-07-24 DIAGNOSIS — Z113 Encounter for screening for infections with a predominantly sexual mode of transmission: Secondary | ICD-10-CM

## 2023-07-24 LAB — COMPLETE METABOLIC PANEL WITHOUT GFR
AG Ratio: 2.1 (calc) (ref 1.0–2.5)
Albumin: 4.4 g/dL (ref 3.6–5.1)
Alkaline phosphatase (APISO): 56 U/L (ref 35–144)
BUN/Creatinine Ratio: 25 (calc) — ABNORMAL HIGH (ref 6–22)
BUN: 27 mg/dL — ABNORMAL HIGH (ref 7–25)
CO2: 23 mmol/L (ref 20–32)
Chloride: 103 mmol/L (ref 98–110)
Creat: 1.08 mg/dL (ref 0.70–1.22)
Globulin: 2.1 g/dL (ref 1.9–3.7)
Glucose, Bld: 206 mg/dL — ABNORMAL HIGH (ref 65–99)
Total Protein: 6.5 g/dL (ref 6.1–8.1)

## 2023-07-24 LAB — CBC WITH DIFFERENTIAL/PLATELET
Absolute Lymphocytes: 1127 {cells}/uL (ref 850–3900)
Absolute Monocytes: 731 {cells}/uL (ref 200–950)
Eosinophils Absolute: 69 {cells}/uL (ref 15–500)
Eosinophils Relative: 0.8 %
MCV: 91.7 fL (ref 80.0–100.0)
MPV: 10.6 fL (ref 7.5–12.5)
Monocytes Relative: 8.5 %
Neutrophils Relative %: 77.4 %
Platelets: 261 10*3/uL (ref 140–400)
RBC: 4.8 10*6/uL (ref 4.20–5.80)
RDW: 12.9 % (ref 11.0–15.0)
WBC: 8.6 10*3/uL (ref 3.8–10.8)

## 2023-07-24 MED ORDER — EMTRICITABINE-TENOFOVIR DF 200-300 MG PO TABS
1.0000 | ORAL_TABLET | Freq: Every day | ORAL | 11 refills | Status: AC
  Filled 2023-07-24 – 2023-08-05 (×2): qty 30, 30d supply, fill #0
  Filled 2023-09-02 – 2023-09-04 (×2): qty 30, 30d supply, fill #1
  Filled 2023-09-25 – 2023-09-27 (×2): qty 30, 30d supply, fill #2
  Filled 2023-10-29: qty 30, 30d supply, fill #3
  Filled 2023-12-03: qty 30, 30d supply, fill #4
  Filled 2024-01-01 – 2024-01-07 (×2): qty 30, 30d supply, fill #5
  Filled 2024-01-20 – 2024-02-10 (×4): qty 30, 30d supply, fill #6
  Filled 2024-03-11: qty 30, 30d supply, fill #7

## 2023-07-25 LAB — URINE CYTOLOGY ANCILLARY ONLY
Chlamydia: NEGATIVE
Comment: NEGATIVE
Comment: NORMAL
Neisseria Gonorrhea: NEGATIVE

## 2023-07-25 LAB — CYTOLOGY, (ORAL, ANAL, URETHRAL) ANCILLARY ONLY
Chlamydia: NEGATIVE
Chlamydia: NEGATIVE
Comment: NEGATIVE
Comment: NEGATIVE
Comment: NORMAL
Comment: NORMAL
Neisseria Gonorrhea: NEGATIVE
Neisseria Gonorrhea: NEGATIVE

## 2023-07-27 LAB — COMPLETE METABOLIC PANEL WITHOUT GFR
ALT: 14 U/L (ref 9–46)
AST: 16 U/L (ref 10–35)
Calcium: 10.2 mg/dL (ref 8.6–10.3)
Potassium: 4.3 mmol/L (ref 3.5–5.3)
Sodium: 137 mmol/L (ref 135–146)
Total Bilirubin: 0.6 mg/dL (ref 0.2–1.2)

## 2023-07-27 LAB — HIV-1 RNA QUANT-NO REFLEX-BLD
HIV 1 RNA Quant: NOT DETECTED {copies}/mL
HIV-1 RNA Quant, Log: NOT DETECTED {Log_copies}/mL

## 2023-07-27 LAB — RPR: RPR Ser Ql: NONREACTIVE

## 2023-07-27 LAB — AFP TUMOR MARKER: AFP-Tumor Marker: 1.9 ng/mL (ref <6.1)

## 2023-07-27 LAB — CBC WITH DIFFERENTIAL/PLATELET
Basophils Absolute: 17 {cells}/uL (ref 0–200)
Basophils Relative: 0.2 %
HCT: 44 % (ref 38.5–50.0)
Hemoglobin: 14.9 g/dL (ref 13.2–17.1)
MCH: 31 pg (ref 27.0–33.0)
MCHC: 33.9 g/dL (ref 32.0–36.0)
Neutro Abs: 6656 {cells}/uL (ref 1500–7800)
Total Lymphocyte: 13.1 %

## 2023-07-27 LAB — HEPATITIS B DNA, ULTRAQUANTITATIVE, PCR
Hepatitis B DNA: <10 [IU]/mL — ABNORMAL HIGH
Hepatitis B virus DNA: <1 {Log_IU}/mL — ABNORMAL HIGH

## 2023-07-30 ENCOUNTER — Telehealth: Payer: Self-pay | Admitting: Infectious Disease

## 2023-07-30 NOTE — Telephone Encounter (Signed)
  I reached out to 2 pharma reps in HIV and PrEP space for recs in the area where he is moving to  Dr. Landon Boatman And  Dr. Elspeth Foy [facility] [place]  Infectious Disease  [address] [place], [place] [address] [phone]   [phone]  2nd rep also recommended Dr. Jacinto and  [facility] [place] [facility], [facility] [address] [place] [place], [place] [address] [phone]   Hopefully Patsy can get in with one of these providers  He really should NOT come off of his Truvada  for fear h is HBV might flare.  Also I think he truly benefits from PrEP for HIV as well  Can someone communicate these recs to [place]?

## 2023-08-05 ENCOUNTER — Other Ambulatory Visit (HOSPITAL_COMMUNITY): Payer: Self-pay

## 2023-08-05 ENCOUNTER — Other Ambulatory Visit: Payer: Self-pay

## 2023-08-05 NOTE — Progress Notes (Signed)
 Specialty Pharmacy Refill Coordination Note  Spoke with   Malvin Dallas DELENA Patsy (Self)    MARRION ACCOMANDO is a 83 y.o. male contacted today regarding refills of specialty medication(s) Emtricitabine -Tenofovir  DF (TRUVADA )  Patient requested: Delivery   Delivery date: 08/07/23   Verified address: [address]  [place] [address]  Medication will be filled on 08/06/23.

## 2023-09-02 ENCOUNTER — Other Ambulatory Visit (HOSPITAL_COMMUNITY): Payer: Self-pay

## 2023-09-02 ENCOUNTER — Other Ambulatory Visit: Payer: Self-pay

## 2023-09-03 ENCOUNTER — Other Ambulatory Visit: Payer: Self-pay

## 2023-09-04 ENCOUNTER — Other Ambulatory Visit: Payer: Self-pay

## 2023-09-04 NOTE — Progress Notes (Signed)
 Specialty Pharmacy Refill Coordination Note  Jacob Ponce is a 83 y.o. male contacted today regarding refills of specialty medication(s) Emtricitabine -Tenofovir  DF (TRUVADA )   Patient requested Delivery   Delivery date: 09/06/23   Verified address: [address]  [place] [address]   Medication will be filled on 09/05/23.

## 2023-09-16 ENCOUNTER — Other Ambulatory Visit: Payer: Self-pay | Admitting: Family Medicine

## 2023-09-16 DIAGNOSIS — N401 Enlarged prostate with lower urinary tract symptoms: Secondary | ICD-10-CM

## 2023-09-25 ENCOUNTER — Other Ambulatory Visit: Payer: Self-pay

## 2023-09-27 ENCOUNTER — Other Ambulatory Visit: Payer: Self-pay

## 2023-09-27 ENCOUNTER — Other Ambulatory Visit: Payer: Self-pay | Admitting: Pharmacy Technician

## 2023-09-27 ENCOUNTER — Telehealth: Payer: Self-pay

## 2023-09-27 DIAGNOSIS — E119 Type 2 diabetes mellitus without complications: Secondary | ICD-10-CM

## 2023-09-27 MED ORDER — ACCU-CHEK GUIDE TEST VI STRP: ORAL_STRIP | 3 refills | Status: AC

## 2023-09-27 MED ORDER — ACCU-CHEK SOFTCLIX LANCETS MISC: 3 refills | Status: AC

## 2023-09-27 MED ORDER — ACCU-CHEK GUIDE W/DEVICE KIT
PACK | 0 refills | Status: AC
Start: 2023-09-27 — End: ?

## 2023-09-27 NOTE — Telephone Encounter (Signed)
 Received faxed message from [facility] stating [facility] no longer covers OneTouch glucose meter and test strips as of 09/08/23. Preferred brands are the following Contour and Accu-Chek products: Accu-Chek Guide Accu-Chek Guide Me Contour Plus Blue Contour Next EZ Contour Next Gen Contour Next One  Spoke with pt relaying message from [facility]. Pt verbalizes understanding and chooses to use Accu-Chek Guide meter and supplies.   E-scribed Accu-Chek Guide meter kit, Accu-Chek Guide test strips and Accu-Chek Softclix lancets.

## 2023-09-27 NOTE — Progress Notes (Signed)
 Specialty Pharmacy Refill Coordination Note  Jacob Ponce is a 83 y.o. male contacted today regarding refills of specialty medication(s) Emtricitabine -Tenofovir  DF (TRUVADA )   Patient requested Delivery   Delivery date: 10/02/23   Verified address: [address]  [place]   Medication will be filled on 10/01/23.

## 2023-10-01 ENCOUNTER — Other Ambulatory Visit: Payer: Self-pay

## 2023-10-21 DIAGNOSIS — Z713 Dietary counseling and surveillance: Secondary | ICD-10-CM | POA: Diagnosis not present

## 2023-10-21 DIAGNOSIS — Z1159 Encounter for screening for other viral diseases: Secondary | ICD-10-CM | POA: Diagnosis not present

## 2023-10-21 DIAGNOSIS — E785 Hyperlipidemia, unspecified: Secondary | ICD-10-CM | POA: Diagnosis not present

## 2023-10-21 DIAGNOSIS — E119 Type 2 diabetes mellitus without complications: Secondary | ICD-10-CM | POA: Diagnosis not present

## 2023-10-21 DIAGNOSIS — Z7689 Persons encountering health services in other specified circumstances: Secondary | ICD-10-CM | POA: Diagnosis not present

## 2023-10-21 DIAGNOSIS — Z1329 Encounter for screening for other suspected endocrine disorder: Secondary | ICD-10-CM | POA: Diagnosis not present

## 2023-10-24 DIAGNOSIS — E119 Type 2 diabetes mellitus without complications: Secondary | ICD-10-CM | POA: Diagnosis not present

## 2023-10-24 DIAGNOSIS — Z1329 Encounter for screening for other suspected endocrine disorder: Secondary | ICD-10-CM | POA: Diagnosis not present

## 2023-10-24 DIAGNOSIS — Z1159 Encounter for screening for other viral diseases: Secondary | ICD-10-CM | POA: Diagnosis not present

## 2023-10-24 DIAGNOSIS — E785 Hyperlipidemia, unspecified: Secondary | ICD-10-CM | POA: Diagnosis not present

## 2023-10-29 ENCOUNTER — Other Ambulatory Visit (HOSPITAL_COMMUNITY): Payer: Self-pay

## 2023-10-30 ENCOUNTER — Other Ambulatory Visit: Payer: Self-pay

## 2023-10-30 ENCOUNTER — Other Ambulatory Visit: Payer: Self-pay | Admitting: Pharmacy Technician

## 2023-10-30 NOTE — Progress Notes (Signed)
 Specialty Pharmacy Refill Coordination Note  Jacob Ponce is a 83 y.o. male contacted today regarding refills of specialty medication(s) Emtricitabine -Tenofovir  DF (TRUVADA )   Patient requested Delivery   Delivery date: 11/07/23   Verified address: [address]   [place] [address]   Medication will be filled on 11/06/23.

## 2023-11-06 ENCOUNTER — Other Ambulatory Visit: Payer: Self-pay

## 2023-12-03 ENCOUNTER — Other Ambulatory Visit: Payer: Self-pay

## 2023-12-03 ENCOUNTER — Other Ambulatory Visit (HOSPITAL_COMMUNITY): Payer: Self-pay

## 2023-12-03 NOTE — Progress Notes (Signed)
 Specialty Pharmacy Refill Coordination Note  Jacob Ponce is a 83 y.o. male contacted today regarding refills of specialty medication(s) Emtricitabine -Tenofovir  DF (TRUVADA )   Patient requested Delivery   Delivery date: 12/06/23   Verified address: [address]   [place]  [address]   Medication will be filled on: 12/05/23

## 2023-12-04 ENCOUNTER — Other Ambulatory Visit: Payer: Self-pay

## 2023-12-26 ENCOUNTER — Other Ambulatory Visit (HOSPITAL_COMMUNITY): Payer: Self-pay

## 2023-12-27 ENCOUNTER — Other Ambulatory Visit: Payer: Self-pay

## 2024-01-01 ENCOUNTER — Other Ambulatory Visit: Payer: Self-pay

## 2024-01-03 ENCOUNTER — Other Ambulatory Visit: Payer: Self-pay

## 2024-01-07 ENCOUNTER — Other Ambulatory Visit: Payer: Self-pay

## 2024-01-07 ENCOUNTER — Other Ambulatory Visit (HOSPITAL_COMMUNITY): Payer: Self-pay

## 2024-01-07 NOTE — Progress Notes (Signed)
 Specialty Pharmacy Refill Coordination Note  Jacob Ponce is a 83 y.o. male contacted today regarding refills of specialty medication(s) Emtricitabine -Tenofovir  DF (TRUVADA )   Patient requested Delivery   Delivery date: 01/14/24   Verified address: [address]   [place] [address]   Medication will be filled on: 01/13/24

## 2024-01-13 ENCOUNTER — Other Ambulatory Visit: Payer: Self-pay

## 2024-01-20 ENCOUNTER — Other Ambulatory Visit: Payer: Self-pay

## 2024-01-20 ENCOUNTER — Telehealth: Payer: Self-pay
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
## 2024-01-20 NOTE — Telephone Encounter (Signed)
 Discussed this matter with the pharmacist and they stated we could give him Descovy  in the meantime. Relayed this message back to call center Pharmacist who stated that she will message me depending on what the patient  would like to do which would be paying the $77 copay or getting Descovy  samples until his next fill.

## 2024-01-20 NOTE — Telephone Encounter (Signed)
 Per [place] in call center :  Jacob Ponce gave us  permission to redo for him this one time, so we are giving it to him at no charge, he will PU at [facility]

## 2024-01-22 ENCOUNTER — Other Ambulatory Visit (HOSPITAL_COMMUNITY): Payer: Self-pay

## 2024-02-04 ENCOUNTER — Other Ambulatory Visit: Payer: Self-pay

## 2024-02-05 ENCOUNTER — Other Ambulatory Visit (HOSPITAL_COMMUNITY): Payer: Self-pay

## 2024-02-07 ENCOUNTER — Other Ambulatory Visit (HOSPITAL_COMMUNITY): Payer: Self-pay

## 2024-02-10 ENCOUNTER — Other Ambulatory Visit (HOSPITAL_COMMUNITY): Payer: Self-pay

## 2024-02-10 ENCOUNTER — Other Ambulatory Visit: Payer: Self-pay

## 2024-02-11 ENCOUNTER — Other Ambulatory Visit: Payer: Self-pay

## 2024-02-11 NOTE — Progress Notes (Signed)
 Specialty Pharmacy Refill Coordination Note  Jacob Ponce is a 84 y.o. male contacted today regarding refills of specialty medication(s) Emtricitabine -Tenofovir  DF (TRUVADA )   Patient requested Delivery   Delivery date: 02/18/24   Verified address: [address]   [place] [address]   Medication will be filled on: 02/17/24

## 2024-02-17 ENCOUNTER — Other Ambulatory Visit: Payer: Self-pay

## 2024-03-11 ENCOUNTER — Other Ambulatory Visit: Payer: Self-pay

## 2024-03-13 ENCOUNTER — Other Ambulatory Visit (HOSPITAL_COMMUNITY): Payer: Self-pay

## 2024-03-13 NOTE — Progress Notes (Signed)
 Specialty Pharmacy Refill Coordination Note  Jacob Ponce is a 84 y.o. male contacted today regarding refills of specialty medication(s) Emtricitabine -Tenofovir  DF (TRUVADA )   Patient requested Delivery   Delivery date: 03/18/24   Verified address: [address]   [place] [address]   Medication will be filled on: 03/17/24

## 2024-05-20 ENCOUNTER — Ambulatory Visit
